# Patient Record
Sex: Male | Born: 1955 | Race: White | Hispanic: No | Marital: Married | Smoking: Former smoker
Health system: Southern US, Community
[De-identification: ages and names within clinical notes are randomized; demographics above are authoritative.]

## PROBLEM LIST (undated history)

## (undated) DIAGNOSIS — M1990 Unspecified osteoarthritis, unspecified site: Secondary | ICD-10-CM

## (undated) DIAGNOSIS — T884XXA Failed or difficult intubation, initial encounter: Secondary | ICD-10-CM

## (undated) DIAGNOSIS — N200 Calculus of kidney: Secondary | ICD-10-CM

## (undated) DIAGNOSIS — Z9989 Dependence on other enabling machines and devices: Secondary | ICD-10-CM

## (undated) DIAGNOSIS — B019 Varicella without complication: Secondary | ICD-10-CM

## (undated) DIAGNOSIS — G4733 Obstructive sleep apnea (adult) (pediatric): Secondary | ICD-10-CM

## (undated) DIAGNOSIS — G4730 Sleep apnea, unspecified: Secondary | ICD-10-CM

## (undated) DIAGNOSIS — E7800 Pure hypercholesterolemia, unspecified: Secondary | ICD-10-CM

## (undated) DIAGNOSIS — I10 Essential (primary) hypertension: Secondary | ICD-10-CM

## (undated) DIAGNOSIS — R3911 Hesitancy of micturition: Secondary | ICD-10-CM

## (undated) DIAGNOSIS — H8110 Benign paroxysmal vertigo, unspecified ear: Secondary | ICD-10-CM

## (undated) DIAGNOSIS — J339 Nasal polyp, unspecified: Secondary | ICD-10-CM

## (undated) DIAGNOSIS — Z87442 Personal history of urinary calculi: Secondary | ICD-10-CM

## (undated) DIAGNOSIS — K5900 Constipation, unspecified: Secondary | ICD-10-CM

## (undated) DIAGNOSIS — M2550 Pain in unspecified joint: Secondary | ICD-10-CM

## (undated) DIAGNOSIS — E119 Type 2 diabetes mellitus without complications: Secondary | ICD-10-CM

## (undated) DIAGNOSIS — T7840XA Allergy, unspecified, initial encounter: Secondary | ICD-10-CM

## (undated) DIAGNOSIS — L209 Atopic dermatitis, unspecified: Secondary | ICD-10-CM

## (undated) DIAGNOSIS — E559 Vitamin D deficiency, unspecified: Secondary | ICD-10-CM

## (undated) DIAGNOSIS — E8881 Metabolic syndrome: Secondary | ICD-10-CM

## (undated) DIAGNOSIS — F341 Dysthymic disorder: Secondary | ICD-10-CM

## (undated) HISTORY — DX: Nasal polyp, unspecified: J33.9

## (undated) HISTORY — PX: OTHER SURGICAL HISTORY: SHX169

## (undated) HISTORY — PX: UMBILICAL HERNIA REPAIR: SHX196

## (undated) HISTORY — DX: Obstructive sleep apnea (adult) (pediatric): G47.33

## (undated) HISTORY — DX: Atopic dermatitis, unspecified: L20.9

## (undated) HISTORY — DX: Varicella without complication: B01.9

## (undated) HISTORY — DX: Allergy, unspecified, initial encounter: T78.40XA

## (undated) HISTORY — DX: Failed or difficult intubation, initial encounter: T88.4XXA

## (undated) HISTORY — DX: Benign paroxysmal vertigo, unspecified ear: H81.10

## (undated) HISTORY — DX: Hesitancy of micturition: R39.11

## (undated) HISTORY — DX: Essential (primary) hypertension: I10

## (undated) HISTORY — DX: Pure hypercholesterolemia, unspecified: E78.00

## (undated) HISTORY — DX: Constipation, unspecified: K59.00

## (undated) HISTORY — DX: Sleep apnea, unspecified: G47.30

## (undated) HISTORY — DX: Calculus of kidney: N20.0

## (undated) HISTORY — DX: Vitamin D deficiency, unspecified: E55.9

## (undated) HISTORY — DX: Pain in unspecified joint: M25.50

## (undated) HISTORY — DX: Metabolic syndrome: E88.81

## (undated) HISTORY — DX: Dysthymic disorder: F34.1

## (undated) HISTORY — PX: LASIK: SHX215

## (undated) HISTORY — DX: Unspecified osteoarthritis, unspecified site: M19.90

## (undated) HISTORY — PX: TONSILLECTOMY: SUR1361

## (undated) HISTORY — DX: Dependence on other enabling machines and devices: Z99.89

---

## 2008-03-15 HISTORY — PX: COLONOSCOPY: SHX174

## 2016-11-10 LAB — CBC AND DIFFERENTIAL
Hemoglobin: 15 (ref 13.5–17.5)
Platelets: 216 (ref 150–399)
WBC: 6.6

## 2016-11-10 LAB — PSA: PSA: 1.45

## 2016-11-10 LAB — LIPID PANEL
Cholesterol: 156 (ref 0–200)
HDL: 37 (ref 35–70)
LDL Cholesterol: 80
Triglycerides: 194 — AB (ref 40–160)

## 2016-11-10 LAB — BASIC METABOLIC PANEL
BUN: 16 (ref 4–21)
Glucose: 93

## 2016-11-10 LAB — TSH: TSH: 1.82 (ref 0.41–5.90)

## 2017-04-01 ENCOUNTER — Ambulatory Visit (INDEPENDENT_AMBULATORY_CARE_PROVIDER_SITE_OTHER): Payer: BLUE CROSS/BLUE SHIELD | Admitting: Family Medicine

## 2017-04-01 ENCOUNTER — Encounter: Payer: Self-pay | Admitting: Family Medicine

## 2017-04-01 VITALS — BP 140/82 | HR 68 | Temp 98.6°F | Wt 280.4 lb

## 2017-04-01 DIAGNOSIS — F341 Dysthymic disorder: Secondary | ICD-10-CM

## 2017-04-01 DIAGNOSIS — I10 Essential (primary) hypertension: Secondary | ICD-10-CM | POA: Diagnosis not present

## 2017-04-01 DIAGNOSIS — N200 Calculus of kidney: Secondary | ICD-10-CM

## 2017-04-01 DIAGNOSIS — E8881 Metabolic syndrome: Secondary | ICD-10-CM

## 2017-04-01 DIAGNOSIS — E782 Mixed hyperlipidemia: Secondary | ICD-10-CM

## 2017-04-01 DIAGNOSIS — Z79899 Other long term (current) drug therapy: Secondary | ICD-10-CM | POA: Diagnosis not present

## 2017-04-01 DIAGNOSIS — E88819 Insulin resistance, unspecified: Secondary | ICD-10-CM

## 2017-04-01 DIAGNOSIS — R3911 Hesitancy of micturition: Secondary | ICD-10-CM

## 2017-04-01 DIAGNOSIS — E559 Vitamin D deficiency, unspecified: Secondary | ICD-10-CM

## 2017-04-01 DIAGNOSIS — M2550 Pain in unspecified joint: Secondary | ICD-10-CM | POA: Diagnosis not present

## 2017-04-01 DIAGNOSIS — L209 Atopic dermatitis, unspecified: Secondary | ICD-10-CM

## 2017-04-01 MED ORDER — SIMVASTATIN 40 MG PO TABS: 40.0000 mg | ORAL_TABLET | Freq: Every day | ORAL | 2 refills | Status: DC

## 2017-04-01 MED ORDER — METFORMIN HCL ER 500 MG PO TB24: 500.0000 mg | ORAL_TABLET | Freq: Two times a day (BID) | ORAL | 2 refills | Status: DC

## 2017-04-01 MED ORDER — ALLOPURINOL 300 MG PO TABS: 300.0000 mg | ORAL_TABLET | Freq: Every day | ORAL | 2 refills | Status: DC

## 2017-04-01 MED ORDER — DICLOFENAC SODIUM 75 MG PO TBEC: 75.0000 mg | DELAYED_RELEASE_TABLET | Freq: Two times a day (BID) | ORAL | 1 refills | Status: DC

## 2017-04-01 MED ORDER — TRIAMTERENE-HCTZ 37.5-25 MG PO CAPS: 1.0000 | ORAL_CAPSULE | Freq: Every day | ORAL | 2 refills | Status: DC

## 2017-04-01 MED ORDER — HYDROCORTISONE 2.5 % EX CREA: TOPICAL_CREAM | Freq: Two times a day (BID) | CUTANEOUS | 1 refills | Status: DC

## 2017-04-01 MED ORDER — CARVEDILOL 12.5 MG PO TABS: 12.5000 mg | ORAL_TABLET | Freq: Two times a day (BID) | ORAL | 2 refills | Status: DC

## 2017-04-03 ENCOUNTER — Encounter: Payer: Self-pay | Admitting: Family Medicine

## 2017-04-03 DIAGNOSIS — M2550 Pain in unspecified joint: Secondary | ICD-10-CM | POA: Insufficient documentation

## 2017-04-03 DIAGNOSIS — E88819 Insulin resistance, unspecified: Secondary | ICD-10-CM

## 2017-04-03 DIAGNOSIS — I10 Essential (primary) hypertension: Secondary | ICD-10-CM | POA: Insufficient documentation

## 2017-04-03 DIAGNOSIS — L209 Atopic dermatitis, unspecified: Secondary | ICD-10-CM

## 2017-04-03 DIAGNOSIS — R3911 Hesitancy of micturition: Secondary | ICD-10-CM | POA: Insufficient documentation

## 2017-04-03 DIAGNOSIS — N200 Calculus of kidney: Secondary | ICD-10-CM | POA: Insufficient documentation

## 2017-04-03 DIAGNOSIS — E8881 Metabolic syndrome: Secondary | ICD-10-CM | POA: Insufficient documentation

## 2017-04-03 DIAGNOSIS — E669 Obesity, unspecified: Secondary | ICD-10-CM | POA: Insufficient documentation

## 2017-04-03 DIAGNOSIS — E559 Vitamin D deficiency, unspecified: Secondary | ICD-10-CM

## 2017-04-03 DIAGNOSIS — F341 Dysthymic disorder: Secondary | ICD-10-CM

## 2017-04-03 DIAGNOSIS — E782 Mixed hyperlipidemia: Secondary | ICD-10-CM | POA: Insufficient documentation

## 2017-04-03 HISTORY — DX: Dysthymic disorder: F34.1

## 2017-04-03 HISTORY — DX: Hesitancy of micturition: R39.11

## 2017-04-03 HISTORY — DX: Vitamin D deficiency, unspecified: E55.9

## 2017-04-03 HISTORY — DX: Metabolic syndrome: E88.81

## 2017-04-03 HISTORY — DX: Pain in unspecified joint: M25.50

## 2017-04-03 HISTORY — DX: Insulin resistance, unspecified: E88.819

## 2017-04-03 HISTORY — DX: Atopic dermatitis, unspecified: L20.9

## 2017-04-03 NOTE — Progress Notes (Signed)
Donald Gilbert is a 62 y.o. male is here to [facility].   Patient Care Team: Helane Rima, DO as PCP - General (Family Medicine)   History of Present Illness:   HPI: See Assessment and Plan section for Problem Based Charting of issues discussed today.  Health Maintenance Due  Topic Date Due  . HEMOGLOBIN A1C  13-May-1955  . Hepatitis C Screening  06[birth year]57  . PNEUMOCOCCAL POLYSACCHARIDE VACCINE (1) 02/29/1960  . FOOT EXAM  02/29/1968  . OPHTHALMOLOGY EXAM  02/29/1968  . HIV Screening  02/28/1973  . COLONOSCOPY  02/29/2008   Depression screen PHQ 2/9 04/01/2017  Decreased Interest 2  Down, Depressed, Hopeless 0  PHQ - 2 Score 2  Altered sleeping 0  Tired, decreased energy 0  Change in appetite 0  Feeling bad or failure about yourself  3  Trouble concentrating 0  Moving slowly or fidgety/restless 0  Suicidal thoughts 0  PHQ-9 Score 5   PMHx, SurgHx, SocialHx, Medications, and Allergies were reviewed in the Visit Navigator and updated as appropriate.   Past Medical History:  Diagnosis Date  . Arthralgia of multiple joints 04/03/2017  . Arthritis   . Atopic dermatitis 04/03/2017  . Chickenpox   . Dysthymia 04/03/2017  . Hyperlipidemia   . Hypertension   . Insulin resistance 04/03/2017  . Kidney stones   . Recurrent kidney stones 04/03/2017  . Urinary hesitancy 04/03/2017  . Vitamin D deficiency 04/03/2017   History reviewed. No pertinent surgical history. History reviewed. No pertinent family history. Social History   Tobacco Use  . Smoking status: Former Smoker    Last attempt to quit: 11/1989    Years since quitting: 27.4  . Smokeless tobacco: Former Neurosurgeon    Types: Chew    Quit date: 2001  Substance Use Topics  . Alcohol use: Not on file  . Drug use: Not on file   Current Medications and Allergies:   .  allopurinol (ZYLOPRIM) 300 MG tablet, Take 1 tablet (300 mg total) by mouth daily., Disp: 30 tablet, Rfl: 2 .  carvedilol (COREG) 12.5 MG tablet,  Take 1 tablet (12.5 mg total) by mouth 2 (two) times daily., Disp: 180 tablet, Rfl: 2 .  diclofenac (VOLTAREN) 75 MG EC tablet, Take 1 tablet (75 mg total) by mouth 2 (two) times daily., Disp: 180 tablet, Rfl: 1 .  hydrocortisone 2.5 % cream, Apply topically 2 (two) times daily., Disp: 30 g, Rfl: 1 .  metFORMIN (GLUCOPHAGE-XR) 500 MG 24 hr tablet, Take 1 tablet (500 mg total) by mouth 2 (two) times daily., Disp: 180 tablet, Rfl: 2 .  simvastatin (ZOCOR) 40 MG tablet, Take 1 tablet (40 mg total) by mouth daily., Disp: 90 tablet, Rfl: 2 .  triamterene-hydrochlorothiazide (DYAZIDE) 37.5-25 MG capsule, Take 1 each (1 capsule total) by mouth daily., Disp: 90 capsule, Rfl: 2  No Known Allergies   Review of Systems:   Pertinent items are noted in the HPI. Otherwise, ROS is negative.  Vitals:   Vitals:   04/01/17 1408  BP: 140/82  Pulse: 68  Temp: 98.6 F (37 C)  TempSrc: Oral  SpO2: 98%  Weight: 280 lb 6.4 oz (127.2 kg)     There is no height or weight on file to calculate BMI.  Physical Exam:   Physical Exam  Constitutional: He is oriented to person, place, and time. He appears well-developed and well-nourished. No distress.  HENT:  Head: Normocephalic and atraumatic.  Right Ear: External ear normal.  Left  Ear: External ear normal.  Nose: Nose normal.  Mouth/Throat: Oropharynx is clear and moist.  Eyes: Conjunctivae and EOM are normal. Pupils are equal, round, and reactive to light.  Neck: Normal range of motion. Neck supple.  Cardiovascular: Normal rate, regular rhythm, normal heart sounds and intact distal pulses.  Pulmonary/Chest: Effort normal and breath sounds normal.  Abdominal: Soft. Bowel sounds are normal.  Musculoskeletal: Normal range of motion.  Neurological: He is alert and oriented to person, place, and time.  Skin: Skin is warm and dry.  Psychiatric: He has a normal mood and affect. His behavior is normal. Judgment and thought content normal.  Nursing note and  vitals reviewed.  No results found for this or any previous visit.  Assessment and Plan:   1. Essential hypertension - carvedilol (COREG) 12.5 MG tablet; Take 1 tablet (12.5 mg total) by mouth 2 (two) times daily.  Dispense: 180 tablet; Refill: 2 - triamterene-hydrochlorothiazide (DYAZIDE) 37.5-25 MG capsule; Take 1 each (1 capsule total) by mouth daily.  Dispense: 90 capsule; Refill: 2 - CBC with Differential/Platelet; Future - Comprehensive metabolic panel; Future  2. Morbid obesity (HCC) - TSH; Future - Vitamin B12; Future  3. Vitamin D deficiency - VITAMIN D 25 Hydroxy (Vit-D Deficiency, Fractures); Future  4. Mixed hyperlipidemia - simvastatin (ZOCOR) 40 MG tablet; Take 1 tablet (40 mg total) by mouth daily.  Dispense: 90 tablet; Refill: 2 - Lipid panel; Future  5. Recurrent kidney stones - allopurinol (ZYLOPRIM) 300 MG tablet; Take 1 tablet (300 mg total) by mouth daily.  Dispense: 30 tablet; Refill: 2  6. Insulin resistance - metFORMIN (GLUCOPHAGE-XR) 500 MG 24 hr tablet; Take 1 tablet (500 mg total) by mouth 2 (two) times daily.  Dispense: 180 tablet; Refill: 2 - Hemoglobin A1c; Future  7. Atopic dermatitis, unspecified type - hydrocortisone 2.5 % cream; Apply topically 2 (two) times daily.  Dispense: 30 g; Refill: 1  8. Arthralgia of multiple joints - diclofenac (VOLTAREN) 75 MG EC tablet; Take 1 tablet (75 mg total) by mouth 2 (two) times daily.  Dispense: 180 tablet; Refill: 1  9. Medication management Patient on Metformin.  - Vitamin B12; Future  10. Dysthymia PHQ9 as above.  11. Urinary hesitancy - PSA; Future  . Reviewed expectations re: course of current medical issues. . Discussed self-management of symptoms. . Outlined signs and symptoms indicating need for more acute intervention. . Patient verbalized understanding and all questions were answered. Marland Kitchen. Health Maintenance issues including appropriate healthy diet, exercise, and smoking avoidance were  discussed with patient. . See orders for this visit as documented in the electronic medical record. . Patient received an After Visit Summary.  Helane RimaErica Mannat Benedetti, DO [place] 04/03/2017  New patient. Records requested. Time spent with the patient: 20 minutes, of which >50% was spent in obtaining information about his symptoms, reviewing his previous labs, evaluations, and treatments, counseling him about his condition (please see the discussed topics above), and developing a plan to further investigate it; he had a number of questions which I addressed.

## 2017-04-04 ENCOUNTER — Other Ambulatory Visit (INDEPENDENT_AMBULATORY_CARE_PROVIDER_SITE_OTHER): Payer: BLUE CROSS/BLUE SHIELD

## 2017-04-04 DIAGNOSIS — E782 Mixed hyperlipidemia: Secondary | ICD-10-CM

## 2017-04-04 DIAGNOSIS — Z79899 Other long term (current) drug therapy: Secondary | ICD-10-CM

## 2017-04-04 DIAGNOSIS — I10 Essential (primary) hypertension: Secondary | ICD-10-CM

## 2017-04-04 DIAGNOSIS — E559 Vitamin D deficiency, unspecified: Secondary | ICD-10-CM | POA: Diagnosis not present

## 2017-04-04 DIAGNOSIS — R3911 Hesitancy of micturition: Secondary | ICD-10-CM

## 2017-04-04 DIAGNOSIS — E8881 Metabolic syndrome: Secondary | ICD-10-CM | POA: Diagnosis not present

## 2017-04-05 LAB — LIPID PANEL
Cholesterol: 140 mg/dL (ref 0–200)
HDL: 31.8 mg/dL — ABNORMAL LOW (ref >39.00)
LDL Cholesterol: 77 mg/dL (ref 0–99)
NonHDL: 107.84
Total CHOL/HDL Ratio: 4
Triglycerides: 154 mg/dL — ABNORMAL HIGH (ref 0.0–149.0)
VLDL: 30.8 mg/dL (ref 0.0–40.0)

## 2017-04-05 LAB — CBC WITH DIFFERENTIAL/PLATELET
Basophils Absolute: 0.1 10*3/uL (ref 0.0–0.1)
Basophils Relative: 0.9 % (ref 0.0–3.0)
Eosinophils Absolute: 0.4 10*3/uL (ref 0.0–0.7)
Eosinophils Relative: 6.4 % — ABNORMAL HIGH (ref 0.0–5.0)
HCT: 43.7 % (ref 39.0–52.0)
Hemoglobin: 15 g/dL (ref 13.0–17.0)
Lymphocytes Relative: 21.7 % (ref 12.0–46.0)
Lymphs Abs: 1.5 10*3/uL (ref 0.7–4.0)
MCHC: 34.5 g/dL (ref 30.0–36.0)
MCV: 86.6 fl (ref 78.0–100.0)
Monocytes Absolute: 0.6 10*3/uL (ref 0.1–1.0)
Monocytes Relative: 9.4 % (ref 3.0–12.0)
Neutro Abs: 4.2 10*3/uL (ref 1.4–7.7)
Neutrophils Relative %: 61.6 % (ref 43.0–77.0)
Platelets: 149 10*3/uL — ABNORMAL LOW (ref 150.0–400.0)
RBC: 5.04 Mil/uL (ref 4.22–5.81)
RDW: 14.5 % (ref 11.5–15.5)
WBC: 6.8 10*3/uL (ref 4.0–10.5)

## 2017-04-05 LAB — VITAMIN B12: Vitamin B-12: 451 pg/mL (ref 211–911)

## 2017-04-05 LAB — COMPREHENSIVE METABOLIC PANEL
ALT: 31 U/L (ref 0–53)
AST: 20 U/L (ref 0–37)
Albumin: 4.2 g/dL (ref 3.5–5.2)
Alkaline Phosphatase: 51 U/L (ref 39–117)
BUN: 17 mg/dL (ref 6–23)
CO2: 29 mEq/L (ref 19–32)
Calcium: 9 mg/dL (ref 8.4–10.5)
Chloride: 101 mEq/L (ref 96–112)
Creatinine, Ser: 0.85 mg/dL (ref 0.40–1.50)
GFR: 98.02 mL/min (ref >60.00)
Glucose, Bld: 88 mg/dL (ref 70–99)
Potassium: 3.4 mEq/L — ABNORMAL LOW (ref 3.5–5.1)
Sodium: 138 mEq/L (ref 135–145)
Total Bilirubin: 0.9 mg/dL (ref 0.2–1.2)
Total Protein: 5.9 g/dL — ABNORMAL LOW (ref 6.0–8.3)

## 2017-04-05 LAB — PSA: PSA: 1.36 ng/mL (ref 0.10–4.00)

## 2017-04-05 LAB — HEMOGLOBIN A1C: Hgb A1c MFr Bld: 5.9 % (ref 4.6–6.5)

## 2017-04-05 LAB — VITAMIN D 25 HYDROXY (VIT D DEFICIENCY, FRACTURES): VITD: 13.71 ng/mL — ABNORMAL LOW (ref 30.00–100.00)

## 2017-04-05 LAB — TSH: TSH: 2.11 u[IU]/mL (ref 0.35–4.50)

## 2017-04-05 MED ORDER — CHOLECALCIFEROL 1.25 MG (50000 UT) PO TABS: ORAL_TABLET | ORAL | 0 refills | Status: DC

## 2017-04-05 NOTE — Addendum Note (Signed)
Addended by: Helane RimaWALLACE, Dulcemaria Bula R on: 04/05/2017 08:27 PM   Modules accepted: Orders

## 2017-04-08 DIAGNOSIS — G4733 Obstructive sleep apnea (adult) (pediatric): Secondary | ICD-10-CM | POA: Diagnosis not present

## 2017-06-26 ENCOUNTER — Other Ambulatory Visit: Payer: Self-pay | Admitting: Family Medicine

## 2017-06-26 DIAGNOSIS — N200 Calculus of kidney: Secondary | ICD-10-CM

## 2017-06-27 ENCOUNTER — Ambulatory Visit (INDEPENDENT_AMBULATORY_CARE_PROVIDER_SITE_OTHER): Payer: BLUE CROSS/BLUE SHIELD | Admitting: Family Medicine

## 2017-06-27 ENCOUNTER — Encounter: Payer: Self-pay | Admitting: Family Medicine

## 2017-06-27 ENCOUNTER — Telehealth: Payer: Self-pay | Admitting: *Deleted

## 2017-06-27 VITALS — BP 138/80 | HR 87 | Temp 98.7°F | Ht 70.0 in | Wt 270.4 lb

## 2017-06-27 DIAGNOSIS — Z1159 Encounter for screening for other viral diseases: Secondary | ICD-10-CM | POA: Diagnosis not present

## 2017-06-27 DIAGNOSIS — R197 Diarrhea, unspecified: Secondary | ICD-10-CM

## 2017-06-27 DIAGNOSIS — E782 Mixed hyperlipidemia: Secondary | ICD-10-CM

## 2017-06-27 DIAGNOSIS — Z1211 Encounter for screening for malignant neoplasm of colon: Secondary | ICD-10-CM

## 2017-06-27 DIAGNOSIS — Z114 Encounter for screening for human immunodeficiency virus [HIV]: Secondary | ICD-10-CM

## 2017-06-27 DIAGNOSIS — I10 Essential (primary) hypertension: Secondary | ICD-10-CM

## 2017-06-27 DIAGNOSIS — Z9189 Other specified personal risk factors, not elsewhere classified: Secondary | ICD-10-CM | POA: Diagnosis not present

## 2017-06-27 LAB — MICROALBUMIN / CREATININE URINE RATIO
Creatinine,U: 183.4 mg/dL
Microalb Creat Ratio: 0.5 mg/g (ref 0.0–30.0)
Microalb, Ur: 0.9 mg/dL (ref 0.0–1.9)

## 2017-06-27 MED ORDER — LOSARTAN POTASSIUM-HCTZ 50-12.5 MG PO TABS: 1.0000 | ORAL_TABLET | Freq: Every day | ORAL | 3 refills | Status: DC

## 2017-06-27 NOTE — Telephone Encounter (Signed)
Called and spoke to patient to gather additional information for appt. He states that he started experiencing diarrhea and vomiting on Thursday. He had to leave work early and was unable to go in on Friday. He states it got better Saturday and Sunday and then his diarrhea started again Sunday night. He also states he has been burping and it has smelled acidic.

## 2017-06-27 NOTE — Patient Instructions (Signed)
MANAGEMENT OF CHRONIC CONSTIPATION   Push fluids, all day, everyday  Eat lots of high fiber foods-fruits, veggies, bran and whole grain instead of white bread  Be active everyday. Inactivity makes constipation worse.  Add psyllium daily (Metamucil) which comes in capsules now. Start very low dose and work up to recommended dose on bottle daily.  Stay away from Milk of Magnesia or any magnesium containing laxative, unless you need it to clear things out rarely. It is an addictive laxative and your gut will become dependent on it.  If that is not working, I would start Miralax, which you can buy in generic 17 gms daily. It's a powder and not an "addictive laxative". Take it every day and titrate the dose up or down to get the daily BM.  Try taking Magnesium 400 mg at night as well.

## 2017-06-27 NOTE — Progress Notes (Signed)
Donald Gilbert is a 61 y.o. male is here for AN ACUTE VISIT.   History of Present Illness:   Donald Gilbert, CMA acting as scribe for Dr. Helane Rima.   HPI: Patient started having diarrhea last Thursday. Got a little better on Saturday. Last night started back up. He has over five bowel movements that started out just loose but the last few have been like water. He has some liquids today no food. No fever or abdominal pain. Stools have changed colors from brown to a green color.   Health Maintenance Due  Topic Date Due  . Hepatitis C Screening  [birth year]  . URINE MICROALBUMIN  02/29/1968  . HIV Screening  02/28/1973  . COLONOSCOPY  02/29/2008   Depression screen PHQ 2/9 04/01/2017  Decreased Interest 2  Down, Depressed, Hopeless 0  PHQ - 2 Score 2  Altered sleeping 0  Tired, decreased energy 0  Change in appetite 0  Feeling bad or failure about yourself  3  Trouble concentrating 0  Moving slowly or fidgety/restless 0  Suicidal thoughts 0  PHQ-9 Score 5   PMHx, SurgHx, SocialHx, FamHx, Medications, and Allergies were reviewed in the Visit Navigator and updated as appropriate.   Patient Active Problem List   Diagnosis Date Noted  . Vitamin D deficiency 04/03/2017  . Morbid obesity (HCC) 04/03/2017  . Essential hypertension 04/03/2017  . Mixed hyperlipidemia 04/03/2017  . Recurrent kidney stones 04/03/2017  . Insulin resistance 04/03/2017  . Arthralgia of multiple joints 04/03/2017  . Atopic dermatitis 04/03/2017  . Urinary hesitancy 04/03/2017  . Dysthymia 04/03/2017   Social History   Tobacco Use  . Smoking status: Former Smoker    Last attempt to quit: 11/1989    Years since quitting: 27.6  . Smokeless tobacco: Former Neurosurgeon    Types: Chew    Quit date: 2001  Substance Use Topics  . Alcohol use: Yes    Comment: OCC   . Drug use: No   Current Medications and Allergies:   .  allopurinol (ZYLOPRIM) 300 MG tablet, TAKE 1 TABLET(300 MG) BY MOUTH DAILY,  Disp: 30 tablet, Rfl: 0 .  aspirin EC 81 MG tablet, Take 1 tablet (81 mg total) by mouth daily., Disp: 90 tablet, Rfl: 0 .  carvedilol (COREG) 12.5 MG tablet, Take 1 tablet (12.5 mg total) by mouth 2 (two) times daily., Disp: 180 tablet, Rfl: 2 .  Cholecalciferol 50000 units TABS, 50,000 units PO qwk for 12 weeks., Disp: 12 tablet, Rfl: 0 .  diclofenac (VOLTAREN) 75 MG EC tablet, Take 1 tablet (75 mg total) by mouth 2 (two) times daily., Disp: 180 tablet, Rfl: 1 .  hydrocortisone 2.5 % cream, Apply topically 2 (two) times daily., Disp: 30 g, Rfl: 1 .  metFORMIN (GLUCOPHAGE-XR) 500 MG 24 hr tablet, Take 1 tablet (500 mg total) by mouth 2 (two) times daily., Disp: 180 tablet, Rfl: 2 .  simvastatin (ZOCOR) 40 MG tablet, Take 1 tablet (40 mg total) by mouth daily., Disp: 90 tablet, Rfl: 2 .  losartan-hydrochlorothiazide (HYZAAR) 50-12.5 MG tablet, Take 1 tablet by mouth daily., Disp: 90 tablet, Rfl: 3  No Known Allergies   Review of Systems   Pertinent items are noted in the HPI. Otherwise, ROS is negative.  Vitals:   Vitals:   06/27/17 1243  BP: 138/80  Pulse: 87  Temp: 98.7 F (37.1 C)  TempSrc: Oral  SpO2: 97%  Weight: 270 lb 6.4 oz (122.7 kg)  Height: 5\' 10"  (1.778  m)     Body mass index is 38.8 kg/m.   Physical Exam:   Physical Exam  Constitutional: He is oriented to person, place, and time. He appears well-developed and well-nourished. No distress.  HENT:  Head: Normocephalic and atraumatic.  Right Ear: External ear normal.  Left Ear: External ear normal.  Nose: Nose normal.  Mouth/Throat: Oropharynx is clear and moist.  Eyes: Pupils are equal, round, and reactive to light. Conjunctivae and EOM are normal.  Neck: Normal range of motion. Neck supple.  Cardiovascular: Normal rate, regular rhythm, normal heart sounds and intact distal pulses.  Pulmonary/Chest: Effort normal and breath sounds normal.  Abdominal: Soft. Bowel sounds are normal. He exhibits distension.    Musculoskeletal: Normal range of motion.  Neurological: He is alert and oriented to person, place, and time.  Skin: Skin is warm and dry.  Psychiatric: He has a normal mood and affect. His behavior is normal. Judgment and thought content normal.  Nursing note and vitals reviewed.   Assessment and Plan:   Donald Gilbert was seen today for diarrhea.  Diagnoses and all orders for this visit:  Diarrhea of presumed infectious origin Comments: Recent GI symptoms likely from gastroenteritis.  Reviewed red flags.  Screening for colon cancer -     Ambulatory referral to Gastroenterology  Essential hypertension Comments: We will alter medication regimen today to optimize blood pressure.  ASCVD will lower to 6.9%. Orders: -     Microalbumin / creatinine urine ratio -     losartan-hydrochlorothiazide (HYZAAR) 50-12.5 MG tablet; Take 1 tablet by mouth daily.  Mixed hyperlipidemia Comments: No medication change today but recommended exercise and fiber to elevate HDL.   Screening for HIV (human immunodeficiency virus) -     HIV antibody  Encounter for hepatitis C virus screening test for high risk patient -     Hepatitis C antibody   . Reviewed expectations re: course of current medical issues. . Discussed self-management of symptoms. . Outlined signs and symptoms indicating need for more acute intervention. . Patient verbalized understanding and all questions were answered. Marland Kitchen. Health Maintenance issues including appropriate healthy diet, exercise, and smoking avoidance were discussed with patient. . See orders for this visit as documented in the electronic medical record. . Patient received an After Visit Summary.  Helane RimaErica Marlys Stegmaier, DO [place] 06/27/2017  Future Appointments  Date Time Provider Department Center  07/08/2017  3:00 PM Helane RimaWallace, Damani Rando, DO [facility]

## 2017-06-28 LAB — HEPATITIS C ANTIBODY
Hepatitis C Ab: NONREACTIVE
SIGNAL TO CUT-OFF: 0.01 (ref <1.00)

## 2017-06-28 LAB — HIV ANTIBODY (ROUTINE TESTING W REFLEX): HIV 1&2 Ab, 4th Generation: NONREACTIVE

## 2017-07-08 ENCOUNTER — Ambulatory Visit: Payer: BLUE CROSS/BLUE SHIELD | Admitting: Family Medicine

## 2017-07-08 DIAGNOSIS — G4733 Obstructive sleep apnea (adult) (pediatric): Secondary | ICD-10-CM | POA: Diagnosis not present

## 2017-07-11 DIAGNOSIS — G4733 Obstructive sleep apnea (adult) (pediatric): Secondary | ICD-10-CM | POA: Diagnosis not present

## 2017-07-20 ENCOUNTER — Other Ambulatory Visit: Payer: Self-pay | Admitting: Family Medicine

## 2017-07-24 ENCOUNTER — Other Ambulatory Visit: Payer: Self-pay | Admitting: Family Medicine

## 2017-07-24 DIAGNOSIS — N200 Calculus of kidney: Secondary | ICD-10-CM

## 2017-08-02 ENCOUNTER — Encounter: Payer: Self-pay | Admitting: Family Medicine

## 2017-08-02 ENCOUNTER — Ambulatory Visit (INDEPENDENT_AMBULATORY_CARE_PROVIDER_SITE_OTHER): Payer: BLUE CROSS/BLUE SHIELD | Admitting: Family Medicine

## 2017-08-02 VITALS — BP 154/76 | HR 58 | Temp 98.7°F | Ht 70.0 in | Wt 280.0 lb

## 2017-08-02 DIAGNOSIS — G4730 Sleep apnea, unspecified: Secondary | ICD-10-CM

## 2017-08-02 DIAGNOSIS — E782 Mixed hyperlipidemia: Secondary | ICD-10-CM

## 2017-08-02 DIAGNOSIS — I10 Essential (primary) hypertension: Secondary | ICD-10-CM | POA: Diagnosis not present

## 2017-08-02 DIAGNOSIS — Z23 Encounter for immunization: Secondary | ICD-10-CM | POA: Diagnosis not present

## 2017-08-02 MED ORDER — ASPIRIN EC 81 MG PO TBEC: 81.0000 mg | DELAYED_RELEASE_TABLET | Freq: Two times a day (BID) | ORAL | 6 refills | Status: DC

## 2017-08-02 MED ORDER — BUPROPION HCL ER (XL) 150 MG PO TB24: 150.0000 mg | ORAL_TABLET | Freq: Every day | ORAL | 3 refills | Status: DC

## 2017-08-02 MED ORDER — LOSARTAN POTASSIUM-HCTZ 100-25 MG PO TABS: 1.0000 | ORAL_TABLET | Freq: Every day | ORAL | 3 refills | Status: DC

## 2017-08-02 NOTE — Progress Notes (Signed)
Donald Gilbert is a 62 y.o. male is here for follow up.  History of Present Illness:   Donald Gilbert CMA acting as scribe for Dr. Juleen China.  HPI: Patient comes in today for his follow up for his hypertension. His systolic number has been elevated for the last month that he has been keeping record of his blood pressures.  Sleep Apnea: Patient has sleep apnea. He is needing new study done. Referral has been placed for neurology.   Hypertension: Patient has been taking his blood pressures at home. They have been running high. Discussed changing blood pressure medication due to being uncontrolled. Will increase  His hydrochlorothiazide to 25 mg. Patient is currently take the asprin 81 mg daily. Will increase to 81 mg morinng and night. Discussed doing a calcium score. Will place an order for CT.   Obesity: Discussed starting the patient on Wellbutrin for weight loss. Patient is not a candidate for stimulants. Patient would like to start the Wellbutrin.    Health Maintenance Due  Topic Date Due  . COLONOSCOPY  02/29/2008   Depression screen PHQ 2/9 04/01/2017  Decreased Interest 2  Down, Depressed, Hopeless 0  PHQ - 2 Score 2  Altered sleeping 0  Tired, decreased energy 0  Change in appetite 0  Feeling bad or failure about yourself  3  Trouble concentrating 0  Moving slowly or fidgety/restless 0  Suicidal thoughts 0  PHQ-9 Score 5   PMHx, SurgHx, SocialHx, FamHx, Medications, and Allergies were reviewed in the Visit Navigator and updated as appropriate.   Patient Active Problem List   Diagnosis Date Noted  . Vitamin D deficiency 04/03/2017  . Morbid obesity ([place]) 04/03/2017  . Essential hypertension 04/03/2017  . Mixed hyperlipidemia 04/03/2017  . Recurrent kidney stones 04/03/2017  . Insulin resistance 04/03/2017  . Arthralgia of multiple joints 04/03/2017  . Atopic dermatitis 04/03/2017  . Urinary hesitancy 04/03/2017  . Dysthymia 04/03/2017   Social History   Tobacco  Use  . Smoking status: Former Smoker    Last attempt to quit: 11/1989    Years since quitting: 27.7  . Smokeless tobacco: Former Systems developer    Types: Chew    Quit date: 2001  Substance Use Topics  . Alcohol use: Yes    Comment: OCC   . Drug use: No   Current Medications and Allergies:   Current Outpatient Medications:  .  allopurinol (ZYLOPRIM) 300 MG tablet, TAKE 1 TABLET(300 MG) BY MOUTH DAILY, Disp: 30 tablet, Rfl: 1 .  aspirin EC 81 MG tablet, Take 1 tablet (81 mg total) by mouth daily., Disp: 90 tablet, Rfl: 0 .  carvedilol (COREG) 12.5 MG tablet, Take 1 tablet (12.5 mg total) by mouth 2 (two) times daily., Disp: 180 tablet, Rfl: 2 .  Cholecalciferol 50000 units TABS, 50,000 units PO qwk for 12 weeks., Disp: 12 tablet, Rfl: 0 .  diclofenac (VOLTAREN) 75 MG EC tablet, Take 1 tablet (75 mg total) by mouth 2 (two) times daily., Disp: 180 tablet, Rfl: 1 .  hydrocortisone 2.5 % cream, Apply topically 2 (two) times daily., Disp: 30 g, Rfl: 1 .  losartan-hydrochlorothiazide (HYZAAR) 50-12.5 MG tablet, Take 1 tablet by mouth daily., Disp: 90 tablet, Rfl: 3 .  metFORMIN (GLUCOPHAGE-XR) 500 MG 24 hr tablet, Take 1 tablet (500 mg total) by mouth 2 (two) times daily., Disp: 180 tablet, Rfl: 2 .  simvastatin (ZOCOR) 40 MG tablet, Take 1 tablet (40 mg total) by mouth daily., Disp: 90 tablet, Rfl: 2  No Known Allergies Review of Systems   Pertinent items are noted in the HPI. Otherwise, ROS is negative.  Vitals:   Vitals:   08/02/17 1603  BP: (!) 154/76  Pulse: (!) 58  Temp: 98.7 F (37.1 C)  TempSrc: Oral  SpO2: 96%  Weight: 280 lb (127 kg)  Height: 5' 10" (1.778 m)     Body mass index is 40.18 kg/m.  Physical Exam:   Physical Exam  Constitutional: He is oriented to person, place, and time. He appears well-developed and well-nourished. No distress.  HENT:  Head: Normocephalic and atraumatic.  Right Ear: External ear normal.  Left Ear: External ear normal.  Nose: Nose normal.    Mouth/Throat: Oropharynx is clear and moist.  Eyes: Pupils are equal, round, and reactive to light. Conjunctivae and EOM are normal.  Neck: Normal range of motion. Neck supple.  Cardiovascular: Normal rate, regular rhythm, normal heart sounds and intact distal pulses.  Pulmonary/Chest: Effort normal and breath sounds normal.  Abdominal: Soft. Bowel sounds are normal.  Musculoskeletal: Normal range of motion.  Neurological: He is alert and oriented to person, place, and time.  Skin: Skin is warm and dry.  Psychiatric: He has a normal mood and affect. His behavior is normal. Judgment and thought content normal.  Nursing note and vitals reviewed.  Assessment and Plan:   Donald Gilbert was seen today for follow-up.  Diagnoses and all orders for this visit:  Sleep apnea, unspecified type Comments: New sleep study placed. Orders: -     Ambulatory referral to Neurology  Essential hypertension Comments: Not at goal. Will increase Hyzaar. Recheck in one month. Orders: -     aspirin EC 81 MG tablet; Take 1 tablet (81 mg total) by mouth 2 (two) times daily. -     losartan-hydrochlorothiazide (HYZAAR) 100-25 MG tablet; Take 1 tablet by mouth daily. -     CT CARDIAC SCORING; Future  Mixed hyperlipidemia -     aspirin EC 81 MG tablet; Take 1 tablet (81 mg total) by mouth 2 (two) times daily. -     losartan-hydrochlorothiazide (HYZAAR) 100-25 MG tablet; Take 1 tablet by mouth daily. -     CT CARDIAC SCORING; Future  Need for immunization against rubella and mumps -     MMR vaccine subcutaneous  Morbid obesity ([place]) -     buPROPion (WELLBUTRIN XL) 150 MG 24 hr tablet; Take 1 tablet (150 mg total) by mouth daily.    . Reviewed expectations re: course of current medical issues. . Discussed self-management of symptoms. . Outlined signs and symptoms indicating need for more acute intervention. . Patient verbalized understanding and all questions were answered. Marland Kitchen Health Maintenance issues  including appropriate healthy diet, exercise, and smoking avoidance were discussed with patient. . See orders for this visit as documented in the electronic medical record. . Patient received an After Visit Summary.  Briscoe Deutscher, DO [place] 08/06/2017  Future Appointments  Date Time Provider [place]  08/10/2017  4:15 PM [facility]  11/04/2017  3:00 PM Briscoe Deutscher, DO [facility]    CMA served as scribe during this visit. History, Physical, and Plan performed by medical provider. The above documentation has been reviewed and is accurate and complete. Briscoe Deutscher, D.O.

## 2017-08-10 ENCOUNTER — Ambulatory Visit
Admission: RE | Admit: 2017-08-10 | Discharge: 2017-08-10 | Disposition: A | Discharge status: Home / Self Care | Payer: Self-pay | Source: Ambulatory Visit | Attending: Family Medicine | Admitting: Family Medicine

## 2017-08-10 DIAGNOSIS — E782 Mixed hyperlipidemia: Secondary | ICD-10-CM

## 2017-08-10 DIAGNOSIS — I10 Essential (primary) hypertension: Secondary | ICD-10-CM

## 2017-08-15 ENCOUNTER — Other Ambulatory Visit: Payer: Self-pay | Admitting: Surgical

## 2017-08-15 DIAGNOSIS — G4730 Sleep apnea, unspecified: Secondary | ICD-10-CM

## 2017-08-16 DIAGNOSIS — G4733 Obstructive sleep apnea (adult) (pediatric): Secondary | ICD-10-CM | POA: Diagnosis not present

## 2017-09-01 ENCOUNTER — Telehealth: Payer: Self-pay | Admitting: Internal Medicine

## 2017-09-01 NOTE — Telephone Encounter (Signed)
°  Received colon/path report. Dr. Leone PayorGessner is Doc of the Day for 06/27/17 PM. Records placed on his desk for review.

## 2017-09-12 ENCOUNTER — Encounter: Payer: Self-pay | Admitting: Internal Medicine

## 2017-09-12 NOTE — Telephone Encounter (Signed)
Since Dr. Elenore PaddyGesner has reviewed records and determined next routine colonoscopy Jan 2020, please put patient on for recall on my list at that time.

## 2017-09-12 NOTE — Telephone Encounter (Signed)
Records placed on Dr. Irving Burtonanis's desk for review.

## 2017-09-12 NOTE — Telephone Encounter (Signed)
Records reviewed Colonoscopy 04/10/2008 Descending and rectal diminutive polyps were hyperplastic and diminutive  Had mild ileitis - erythematous ileal mucosa (was a screening exam)  Please tell the patient that next routine colonoscopy should be in about 03/2018 and we should place a recall for that  If he has chronic diarrhea issues (? If the inflammation in his ileum (small intestine) is ongoing then he should schedule an office visit and we can discuss  We should assign him to Dr. Myrtie Neitheranis for recall and OV if needed

## 2017-09-12 NOTE — Telephone Encounter (Signed)
Left message for patient to return my call and recall colon has been entered.

## 2017-09-16 DIAGNOSIS — G4733 Obstructive sleep apnea (adult) (pediatric): Secondary | ICD-10-CM | POA: Diagnosis not present

## 2017-09-19 ENCOUNTER — Other Ambulatory Visit: Payer: Self-pay | Admitting: Family Medicine

## 2017-09-19 DIAGNOSIS — M2550 Pain in unspecified joint: Secondary | ICD-10-CM

## 2017-09-20 ENCOUNTER — Other Ambulatory Visit: Payer: Self-pay | Admitting: Family Medicine

## 2017-09-20 DIAGNOSIS — N200 Calculus of kidney: Secondary | ICD-10-CM

## 2017-10-20 ENCOUNTER — Ambulatory Visit (INDEPENDENT_AMBULATORY_CARE_PROVIDER_SITE_OTHER): Payer: BLUE CROSS/BLUE SHIELD | Admitting: Neurology

## 2017-10-20 ENCOUNTER — Encounter: Payer: Self-pay | Admitting: Neurology

## 2017-10-20 VITALS — BP 142/88 | HR 56 | Ht 70.0 in | Wt 279.0 lb

## 2017-10-20 DIAGNOSIS — Z9989 Dependence on other enabling machines and devices: Secondary | ICD-10-CM

## 2017-10-20 DIAGNOSIS — G4733 Obstructive sleep apnea (adult) (pediatric): Secondary | ICD-10-CM

## 2017-10-20 NOTE — Progress Notes (Signed)
Subjective:    Patient ID: Donald Gilbert is a 62 y.o. male.  HPI     Donald Foley, MD, PhD [facility] [address] [address] [place], [place] [address]  Dear Dr. Earlene Plater,   I saw your patient, Donald Gilbert, upon your kind request in my neurologic clinic today for initial consultation of his sleep disorder in particular, reevaluation of his prior diagnosis of OSA. The patient is accompanied by his wife today. As you know, Donald Gilbert is a 62 year old right-handed gentleman with an underlying medical history of vitamin D deficiency, hypertension, hyperlipidemia, recurrent kidney stones, insulin resistance, b/l lower extremity edema, arthritis, eczema, remote history of smoking and morbid obesity with BMI of 40, who was previously diagnosed with obstructive sleep apnea and placed on CPAP therapy. Prior sleep study results are not available for my review today. We will request old sleep study records. He had a sleep specialist in [place] but has not seen him in over one year. CPAP supplies were coming from [place]. A CPAP download was reviewed today from 09/20/2017 through 10/19/2017 which is a total of 30 days, during which time he used his CPAP 29 days with percent used days greater than 4 hours at 76.7%, indicating adequate compliance with an average usage of 4 hours and 41 minutes, residual AHI at goal at 0.7 per hour, leak low, CPAP pressure of 10 cm. I reviewed your office note from 08/02/2017. Using a Mirage FX nasal mask with success. He typically does not sleep very long. He averages about 4-5 hours of sleep he reports. Bedtime is generally around 8:30 or 9, rise time around 2 AM. He does not have night to night nocturia or recurrent morning headaches. He does not report any family history of OSA. He had UPPP after his original sleep apnea diagnosis. His sleep study was about 6 or 7 years ago. He moved from [place] back to [place] in October 2018 to be closer  to his daughter and now 92-month-old granddaughter. He has another daughter in [place]. He quit smoking in 1991 and drinks alcohol occasionally, once or twice per week, caffeine none regular basis, came off of soda. He lives with his wife. They help take care of the grandbaby. He would like to get new supplies. 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 Weight has fluctuated a little bit but generally has remained fairly stable.  His Past Medical History Is Significant For: Past Medical History:  Diagnosis Date  . Arthralgia of multiple joints 04/03/2017  . Atopic dermatitis 04/03/2017  . BPPV (benign paroxysmal positional vertigo)   . Chickenpox   . Dysthymia 04/03/2017  . Hyperlipidemia   . Hypertension   . Insulin resistance 04/03/2017  . Kidney stone   . Nasal polyp   . OSA on CPAP   . Recurrent kidney stones 04/03/2017  . Urinary hesitancy 04/03/2017  . Vitamin D deficiency 04/03/2017    His Past Surgical History Is Significant For: Past Surgical History:  Procedure Laterality Date  . COLONOSCOPY  03/2008   no precancerous polyps  . TONSILLECTOMY    . UMBILICAL HERNIA REPAIR      His Family History Is Significant For: Family History  Problem Relation Age of Onset  . Hypertension Mother     His Social History Is Significant For: Social History   Socioeconomic History  . Marital status: Married    Spouse name: Not on file  . Number  of children: Not on file  . Years of education: Not on file  . Highest education level: Not on file  Occupational History    Employer: [employer]  Social Needs  . Financial resource strain: Not on file  . Food insecurity:    Worry: Not on file    Inability: Not on file  . Transportation needs:    Medical: Not on file    Non-medical: Not on file  Tobacco Use  . Smoking status: Former Smoker    Last attempt to quit: 11/1989    Years since quitting: 27.9  . Smokeless tobacco: Former NeurosurgeonUser     Types: Chew    Quit date: 2001  Substance and Sexual Activity  . Alcohol use: Yes    Comment: OCC   . Drug use: No  . Sexual activity: Yes  Lifestyle  . Physical activity:    Days per week: Not on file    Minutes per session: Not on file  . Stress: Not on file  Relationships  . Social connections:    Talks on phone: Not on file    Gets together: Not on file    Attends religious service: Not on file    Active member of club or organization: Not on file    Attends meetings of clubs or organizations: Not on file    Relationship status: Not on file  Other Topics Concern  . Not on file  Social History Narrative  . Not on file    His Allergies Are:  No Known Allergies:   His Current Medications Are:  Outpatient Encounter Medications as of 10/20/2017  Medication Sig  . allopurinol (ZYLOPRIM) 300 MG tablet TAKE 1 TABLET(300 MG) BY MOUTH DAILY  . aspirin EC 81 MG tablet Take 1 tablet (81 mg total) by mouth 2 (two) times daily.  Marland Kitchen. buPROPion (WELLBUTRIN XL) 150 MG 24 hr tablet Take 1 tablet (150 mg total) by mouth daily.  . carvedilol (COREG) 12.5 MG tablet Take 1 tablet (12.5 mg total) by mouth 2 (two) times daily.  . Cholecalciferol 50000 units TABS 50,000 units PO qwk for 12 weeks.  . diclofenac (VOLTAREN) 75 MG EC tablet TAKE 1 TABLET(75 MG) BY MOUTH TWICE DAILY  . hydrocortisone 2.5 % cream Apply topically 2 (two) times daily.  Marland Kitchen. losartan-hydrochlorothiazide (HYZAAR) 100-25 MG tablet Take 1 tablet by mouth daily.  . metFORMIN (GLUCOPHAGE-XR) 500 MG 24 hr tablet Take 1 tablet (500 mg total) by mouth 2 (two) times daily.  . simvastatin (ZOCOR) 40 MG tablet Take 1 tablet (40 mg total) by mouth daily.  . [DISCONTINUED] losartan-hydrochlorothiazide (HYZAAR) 50-12.5 MG tablet Take 1 tablet by mouth daily.  . [DISCONTINUED] aspirin EC 81 MG tablet Take 1 tablet (81 mg total) by mouth daily.  . [DISCONTINUED] losartan-hydrochlorothiazide (HYZAAR) 100-25 MG tablet Take 1 tablet by  mouth daily.   No facility-administered encounter medications on file as of 10/20/2017.   :  Review of Systems:  Out of a complete 14 point review of systems, all are reviewed and negative with the exception of these symptoms as listed below: Review of Systems  Neurological:       Patient reports that his doctor in [place] has been filling his supplies but he hasn't seen him in almost a year. They are here to establish care.   Epworth Sleepiness Scale 0= would never doze 1= slight chance of dozing 2= moderate chance of dozing 3= high chance of dozing  Sitting and reading:3  Watching TV:2 Sitting inactive in a public place (ex. Theater or meeting):1 As a passenger in a car for an hour without a break:1 Lying down to rest in the afternoon:3 Sitting and talking to someone:0 Sitting quietly after lunch (no alcohol):1 In a car, while stopped in traffic:0 Total: 11     Objective:  Neurological Exam  Physical Exam Physical Examination:   Vitals:   10/20/17 1541  BP: (!) 142/88  Pulse: (!) 56    General Examination: The patient is a very pleasant 62 y.o. male in no acute distress. He appears well-developed and well-nourished and well groomed.   HEENT: Normocephalic, atraumatic, pupils are equal, round and reactive to light and accommodation. Funduscopic exam is normal with sharp disc margins noted. Extraocular tracking is good without limitation to gaze excursion or nystagmus noted. Normal smooth pursuit is noted. Hearing is grossly intact. Tympanic membranes are clear bilaterally. Face is symmetric with normal facial animation and normal facial sensation. Speech is clear with no dysarthria noted. There is no hypophonia. There is no lip, neck/head, jaw or voice tremor. Neck is supple with full range of passive and active motion. There are no carotid bruits on auscultation. Oropharynx exam reveals: mild mouth dryness, adequate dental hygiene and moderate airway crowding secondary to smaller  airway entry and larger tongue. Mallampati is class II. Tongue protrudes centrally and palate elevates symmetrically. Uvula is absent, tonsils are absent. Neck circumference is 20-5/8 inches. He has a mild overbite.   Chest: Clear to auscultation without wheezing, rhonchi or crackles noted.  Heart: S1+S2+0, regular and normal without murmurs, rubs or gallops noted.   Abdomen: Soft, non-tender and non-distended with normal bowel sounds appreciated on auscultation.  Extremities: There is 1+ pitting edema in the distal lower extremities bilaterally.   Skin: Warm and dry without trophic changes noted.  Musculoskeletal: exam reveals no obvious joint deformities, tenderness or joint swelling or erythema.   Neurologically:  Mental status: The patient is awake, alert and oriented in all 4 spheres. His immediate and remote memory, attention, language skills and fund of knowledge are appropriate. There is no evidence of aphasia, agnosia, apraxia or anomia. Speech is clear with normal prosody and enunciation. Thought process is linear. Mood is normal and affect is normal.  Cranial nerves II - XII are as described above under HEENT exam. In addition: shoulder shrug is normal with equal shoulder height noted. Motor exam: Normal bulk, strength and tone is noted. There is no tremor. Fine motor skills and coordination:  intact with normal finger taps, normal hand movements, normal rapid alternating patting, normal foot taps and normal foot agility.  Cerebellar testing: No dysmetria or intention tremor. There is no truncal or gait ataxia.  Sensory exam: intact to light touch in the upper and lower extremities.  Gait, station and balance: He stands easily. No veering to one side is noted. No leaning to one side is noted. Posture is age-appropriate and stance is narrow based. Gait shows normal stride length and normal pace. No problems turning are noted.   Assessment and Plan:  In summary, SAMRAT HAYWARD is a  very pleasant 62 y.o.-year old male with an underlying medical history of vitamin D deficiency, hypertension, hyperlipidemia, recurrent kidney stones, insulin resistance, b/l lower extremity edema, arthritis, eczema, remote history of smoking and morbid obesity with BMI of 40, who was previously diagnosed with obstructive sleep apnea and placed on CPAP therapy. His machine is approximately 60 years old or maybe a little longer. It  works quite well for him. He needs new supplies. He is compliant with treatment, I reviewed his most recent CPAP compliance data. He is advised to try to strive for consistent usage and consistent sleep schedule. He reports that he averages about 4-5 hours of sleep typically. He is advised to continue with treatment. We will try to get a sleep study results from his prior sleep studies. We will try to get him established with a local DME company that is in network with his current insurance. He requested [facility]. Placed an order for CPAP related supplies, pressure at 10 cm is adequate currently with a residual AHI low at 0.7 per hour. He would be willing to proceed with a repeat sleep study if the need arises. For now, he will continue with the current treatment settings via nasal mask. I explained the importance of being compliant with PAP treatment, not only for insurance purposes but primarily to improve His symptoms, and for the patient's long term health benefit, including to reduce His cardiovascular risks. I would like to see him back routinely in 6 months, sooner if needed. I answered all their questions today and the patient and his wife were in agreement.  Thank you very much for allowing me to participate in the care of this nice patient. If I can be of any further assistance to you please do not hesitate to call me at [phone].  Sincerely,   Donald Foley, MD, PhD

## 2017-10-20 NOTE — Patient Instructions (Addendum)
I will prescribe CPAP supplies for your CPAP machine.  If we cannot get supplies based on your old sleep studies we may have to repeat sleep studies. I will order a study if needed. For now, continue using your CPAP.  Please continue using your CPAP regularly. While your insurance requires that you use CPAP at least 4 hours each night on 70% of the nights, I recommend, that you not skip any nights and use it throughout the night if you can. Getting used to CPAP and staying with the treatment long term does take time and patience and discipline. Untreated obstructive sleep apnea when it is moderate to severe can have an adverse impact on cardiovascular health and raise her risk for heart disease, arrhythmias, hypertension, congestive heart failure, stroke and diabetes. Untreated obstructive sleep apnea causes sleep disruption, nonrestorative sleep, and sleep deprivation. This can have an impact on your day to day functioning and cause daytime sleepiness and impairment of cognitive function, memory loss, mood disturbance, and problems focussing. Using CPAP regularly can improve these symptoms.  I will see you back in 6 months for sleep apnea check up, and if you continue to do well on CPAP I will see you once a year thereafter.

## 2017-10-25 ENCOUNTER — Telehealth: Payer: Self-pay

## 2017-11-04 ENCOUNTER — Encounter: Payer: Self-pay | Admitting: Family Medicine

## 2017-11-04 ENCOUNTER — Ambulatory Visit (INDEPENDENT_AMBULATORY_CARE_PROVIDER_SITE_OTHER): Payer: BLUE CROSS/BLUE SHIELD | Admitting: Family Medicine

## 2017-11-04 VITALS — BP 140/84 | HR 64 | Temp 98.6°F | Ht 70.0 in | Wt 279.0 lb

## 2017-11-04 DIAGNOSIS — E8881 Metabolic syndrome: Secondary | ICD-10-CM | POA: Diagnosis not present

## 2017-11-04 DIAGNOSIS — E88819 Insulin resistance, unspecified: Secondary | ICD-10-CM

## 2017-11-04 DIAGNOSIS — I10 Essential (primary) hypertension: Secondary | ICD-10-CM

## 2017-11-04 MED ORDER — DULAGLUTIDE 1.5 MG/0.5ML ~~LOC~~ SOAJ: 1.5000 mg | SUBCUTANEOUS | 6 refills | Status: DC

## 2017-11-04 NOTE — Patient Instructions (Signed)
You were started on Trulicity today. You have taken the first dose in the office of the .75mg  you will take the next .75mg  on Friday. If you have no side effects you will move up to the 1.5mg  to be taken every Friday after that. I have called in prescription for the higher dose into your pharmacy for when you are out of samples. If you have any questions give our office a call.

## 2017-11-04 NOTE — Progress Notes (Signed)
Donald Gilbert is a 62 y.o. male is here for follow up.  History of Present Illness:   Donald Gilbert, CMA acting as scribe for Dr. Helane RimaErica Miller [place].   HPI: Patient in office for follow up. He has been keeping log of his blood pressure readings and have ranged from 125/62 to 149/84. Has log with him today for review. Has been taking medication as directed with no problems.   Health Maintenance Due  Topic Date Due  . INFLUENZA VACCINE  10/13/2017   Depression screen PHQ 2/9 04/01/2017  Decreased Interest 2  Down, Depressed, Hopeless 0  PHQ - 2 Score 2  Altered sleeping 0  Tired, decreased energy 0  Change in appetite 0  Feeling bad or failure about yourself  3  Trouble concentrating 0  Moving slowly or fidgety/restless 0  Suicidal thoughts 0  PHQ-9 Score 5   PMHx, SurgHx, SocialHx, FamHx, Medications, and Allergies were reviewed in the Visit Navigator and updated as appropriate.   Patient Active Problem List   Diagnosis Date Noted  . Vitamin D deficiency 04/03/2017  . Morbid obesity (HCC) 04/03/2017  . Essential hypertension 04/03/2017  . Mixed hyperlipidemia 04/03/2017  . Recurrent kidney stones 04/03/2017  . Insulin resistance 04/03/2017  . Arthralgia of multiple joints 04/03/2017  . Atopic dermatitis 04/03/2017  . Urinary hesitancy 04/03/2017  . Dysthymia 04/03/2017   Social History   Tobacco Use  . Smoking status: Former Smoker    Last attempt to quit: 11/1989    Years since quitting: 27.9  . Smokeless tobacco: Former NeurosurgeonUser    Types: Chew    Quit date: 2001  Substance Use Topics  . Alcohol use: Yes    Comment: OCC   . Drug use: No   Current Medications and Allergies:   Current Outpatient Medications:  .  allopurinol (ZYLOPRIM) 300 MG tablet, TAKE 1 TABLET(300 MG) BY MOUTH DAILY, Disp: 30 tablet, Rfl: 0 .  aspirin EC 81 MG tablet, Take 1 tablet (81 mg total) by mouth 2 (two) times daily., Disp: 60 tablet, Rfl: 6 .  buPROPion (WELLBUTRIN XL) 150 MG 24 hr  tablet, Take 1 tablet (150 mg total) by mouth daily., Disp: 60 tablet, Rfl: 3 .  carvedilol (COREG) 12.5 MG tablet, Take 1 tablet (12.5 mg total) by mouth 2 (two) times daily., Disp: 180 tablet, Rfl: 2 .  Cholecalciferol 50000 units TABS, 50,000 units PO qwk for 12 weeks., Disp: 12 tablet, Rfl: 0 .  diclofenac (VOLTAREN) 75 MG EC tablet, TAKE 1 TABLET(75 MG) BY MOUTH TWICE DAILY, Disp: 180 tablet, Rfl: 0 .  losartan-hydrochlorothiazide (HYZAAR) 100-25 MG tablet, Take 1 tablet by mouth daily., Disp: , Rfl:  .  metFORMIN (GLUCOPHAGE-XR) 500 MG 24 hr tablet, Take 1 tablet (500 mg total) by mouth 2 (two) times daily., Disp: 180 tablet, Rfl: 2 .  simvastatin (ZOCOR) 40 MG tablet, Take 1 tablet (40 mg total) by mouth daily., Disp: 90 tablet, Rfl: 2 .  Dulaglutide (TRULICITY) 1.5 MG/0.5ML SOPN, Inject 1.5 mg into the skin once a week., Disp: 4 pen, Rfl: 6  No Known Allergies Review of Systems   Pertinent items are noted in the HPI. Otherwise, ROS is negative.  Vitals:   Vitals:   11/04/17 1446  BP: 140/84  Pulse: 64  Temp: 98.6 F (37 C)  TempSrc: Oral  SpO2: 96%  Weight: 279 lb (126.6 kg)  Height: 5\' 10"  (1.778 m)     Body mass index is 40.03 kg/m.  Physical  Exam:   Physical Exam  Constitutional: He is oriented to person, place, and time. He appears well-developed and well-nourished. No distress.  HENT:  Head: Normocephalic and atraumatic.  Right Ear: External ear normal.  Left Ear: External ear normal.  Nose: Nose normal.  Mouth/Throat: Oropharynx is clear and moist.  Eyes: Pupils are equal, round, and reactive to light. Conjunctivae and EOM are normal.  Neck: Normal range of motion. Neck supple.  Cardiovascular: Normal rate, regular rhythm, normal heart sounds and intact distal pulses.  Pulmonary/Chest: Effort normal and breath sounds normal.  Abdominal: Soft. Bowel sounds are normal.  Musculoskeletal: Normal range of motion.  Neurological: He is alert and oriented to  person, place, and time.  Skin: Skin is warm and dry.  Psychiatric: He has a normal mood and affect. His behavior is normal. Judgment and thought content normal.  Nursing note and vitals reviewed.   Results for orders placed or performed in visit on 06/27/17  Hepatitis C antibody  Result Value Ref Range   Hepatitis C Ab NON-REACTIVE NON-REACTI   SIGNAL TO CUT-OFF 0.01 <1.00  HIV antibody  Result Value Ref Range   HIV 1&2 Ab, 4th Generation NON-REACTIVE NON-REACTI  Microalbumin / creatinine urine ratio  Result Value Ref Range   Microalb, Ur 0.9 0.0 - 1.9 mg/dL   Creatinine,U 098.1 mg/dL   Microalb Creat Ratio 0.5 0.0 - 30.0 mg/g    Assessment and Plan:   Taejon was seen today for follow-up.  Diagnoses and all orders for this visit:  Essential hypertension Comments: Improved. Not at goal yet but patient would like to try weight loss before starting a new medication.  Morbid obesity (HCC) Comments: Trial Trulicity.  Orders: -     Dulaglutide (TRULICITY) 1.5 MG/0.5ML SOPN; Inject 1.5 mg into the skin once a week.  Insulin resistance Comments: Trial Trulicity. Orders: -     Dulaglutide (TRULICITY) 1.5 MG/0.5ML SOPN; Inject 1.5 mg into the skin once a week.    . Reviewed expectations re: course of current medical issues. . Discussed self-management of symptoms. . Outlined signs and symptoms indicating need for more acute intervention. . Patient verbalized understanding and all questions were answered. Marland Kitchen Health Maintenance issues including appropriate healthy diet, exercise, and smoking avoidance were discussed with patient. . See orders for this visit as documented in the electronic medical record. . Patient received an After Visit Summary.  CMA served as Neurosurgeon during this visit. History, Physical, and Plan performed by medical provider. The above documentation has been reviewed and is accurate and complete. Helane Rima, D.O.  Helane Rima, DO [place] [place]  [facility] 11/05/2017

## 2017-11-05 LAB — HM DIABETES EYE EXAM

## 2017-12-14 ENCOUNTER — Other Ambulatory Visit: Payer: Self-pay | Admitting: Family Medicine

## 2017-12-14 DIAGNOSIS — M2550 Pain in unspecified joint: Secondary | ICD-10-CM

## 2017-12-16 ENCOUNTER — Other Ambulatory Visit: Payer: Self-pay | Admitting: Family Medicine

## 2017-12-16 DIAGNOSIS — N200 Calculus of kidney: Secondary | ICD-10-CM

## 2017-12-17 ENCOUNTER — Other Ambulatory Visit: Payer: Self-pay | Admitting: Family Medicine

## 2017-12-17 DIAGNOSIS — E782 Mixed hyperlipidemia: Secondary | ICD-10-CM

## 2017-12-19 ENCOUNTER — Telehealth: Payer: Self-pay

## 2017-12-19 MED ORDER — ALLOPURINOL 300 MG PO TABS: 300.0000 mg | ORAL_TABLET | Freq: Every day | ORAL | 0 refills | Status: DC

## 2017-12-19 NOTE — Telephone Encounter (Signed)
Patient informed gave samples to wife.

## 2017-12-19 NOTE — Telephone Encounter (Signed)
Okay to change to Ozempic.

## 2017-12-19 NOTE — Telephone Encounter (Signed)
Patient called states that he had been on Trulicity for the first few weeks he could tell a big change. Now he does not see where it is helping at all. He is on the 1.5mg . Wanted to know if he could go up on dose or change to something else.

## 2018-01-12 ENCOUNTER — Encounter: Payer: Self-pay | Admitting: Neurology

## 2018-01-12 ENCOUNTER — Telehealth: Payer: Self-pay | Admitting: Neurology

## 2018-01-12 DIAGNOSIS — G4733 Obstructive sleep apnea (adult) (pediatric): Secondary | ICD-10-CM

## 2018-01-12 DIAGNOSIS — Z6841 Body Mass Index (BMI) 40.0 and over, adult: Secondary | ICD-10-CM

## 2018-01-12 DIAGNOSIS — Z9989 Dependence on other enabling machines and devices: Principal | ICD-10-CM

## 2018-01-12 NOTE — Telephone Encounter (Signed)
Pts wife Natasha Mead on [place] called stating that Donald Gilbert did not get the supply list Dr. Frances Furbish ordered. Requesting a call to advise

## 2018-01-12 NOTE — Telephone Encounter (Addendum)
I refaxed the order. Received a receipt of confirmation.  I called [facility] to discuss, spoke with Crystal. She reports that she can't find the pt in their system. She says that they aren't able to see pt's charts from [place]. She will need demographics, insurance, PSG, and the order for cpap supplies to help the pt.  I called pt's wife. She reports that they have never been a pt of [facility]. I explained to her that pt may need another sleep study since the only one in our system is a titration study, which will not work. She asked me to call [facility] to get the sleep studies. She will be coming up today to have his cpap chip downloaded per DOT requirement.  I called [facility] at [phone] and left message for MR to send [place] all sleep studies.

## 2018-01-16 NOTE — Telephone Encounter (Signed)
I reviewed his CPAP compliance data from 10/15/2017 through 01/12/2018 which is a total of 90 days, during which time patient used his CPAP 89 days with percent used days greater than 4 hours and 82.2%, indicating very good compliance with an average usage of 4 hours and 35 minutes only for days on treatment. Residual AHI 0.5 per hour, at goal, leak low, pressure set at 10 cm. Patient has been compliant with CPAP. Nevertheless, to qualify for new equipment he will need updated sleep study testing. I will order sleep study, please call pt to schedule.

## 2018-01-16 NOTE — Telephone Encounter (Signed)
Received a call from [facility] from [facility]. They only have titration studies for this pt. She cannot find a baseline sleep study.

## 2018-01-16 NOTE — Telephone Encounter (Signed)
I called pt's wife, advised her that Shelly Coss reports that they don't have a baseline sleep study on this pt. Pt's wife thinks that pt should just go ahead and repeat a PSG. Pt's wife asked me to call the pt to discuss this further.  I called pt. He thinks his baseline sleep study was done in [place] but he can't remember the name of the center nor exactly when it was, other than it was more than 5 years ago. Pt is requesting a new sleep study. I explained that our sleep lab will check with his insurance regarding in-lab or HST coverage and call the pt to schedule. Pt verbalized understanding.

## 2018-01-16 NOTE — Addendum Note (Signed)
Addended by: Huston Foley on: 01/16/2018 04:35 PM   Modules accepted: Orders

## 2018-01-16 NOTE — Telephone Encounter (Signed)
I called pt's wife, Natasha Mead, per DPR, to discuss. No answer, left a message asking her to call me back.

## 2018-01-27 ENCOUNTER — Other Ambulatory Visit: Payer: Self-pay

## 2018-01-27 MED ORDER — PREDNISONE 5 MG PO TABS: 5.0000 mg | ORAL_TABLET | Freq: Every day | ORAL | 0 refills | Status: DC

## 2018-02-07 ENCOUNTER — Ambulatory Visit (INDEPENDENT_AMBULATORY_CARE_PROVIDER_SITE_OTHER): Payer: BLUE CROSS/BLUE SHIELD | Admitting: Neurology

## 2018-02-07 DIAGNOSIS — Z9989 Dependence on other enabling machines and devices: Secondary | ICD-10-CM

## 2018-02-07 DIAGNOSIS — Z6841 Body Mass Index (BMI) 40.0 and over, adult: Secondary | ICD-10-CM

## 2018-02-07 DIAGNOSIS — G4733 Obstructive sleep apnea (adult) (pediatric): Secondary | ICD-10-CM

## 2018-02-14 ENCOUNTER — Telehealth: Payer: Self-pay

## 2018-02-14 NOTE — Telephone Encounter (Signed)
-----   Message from Huston FoleySaima Athar, MD sent at 02/14/2018  8:26 AM EST ----- Patient referred by Dr. Earlene PlaterWallace, seen by me on 10/20/17, split night sleep study on 02/07/18. Please call and notify patient that the recent sleep study confirmed the diagnosis of severe OSA. He did well with CPAP during the study with significant improvement of the respiratory events. Therefore, I would like to prescribe new equipment and a new CPAP machine, he does not have a current DME. I placed the order in the chart.  Please advise patient that we need a follow up appointment with either myself or one of our nurse practitioners in about 10 weeks post set-up to check for how the patient is feeling and how well the patient is using the machine, etc. Please go ahead and schedule the appointment, while you have the patient on the phone and make sure patient understands the importance of keeping this window for the FU appointment, as it is often an insurance requirement. Failing to adhere to this may result in losing coverage for sleep apnea treatment, at which point most patients are left with a choice of returning the machine or paying out of pocket (and we want neither of this to happen!).  Please re-enforce the importance of compliance with treatment and the need for us to monitor compliance data - again an insurance requirement and usually a good feedback for the patient as far as how they are doing.  Also remind patient, that any PAP machine or mask issues should be first addressed with the DME company, who provided the machine/mask.  Please ask if patient has a preference regarding DME company, may depend on the insurance too.  Please arrange for CPAP set up at home through a DME company of patient's choice.  Once you have spoken to the patient you can close the phone encounter. Please fax/route report to referring provider, thanks,   Huston FoleySaima Athar, MD, PhD [facility])

## 2018-02-14 NOTE — Procedures (Signed)
PATIENT'S NAME:  Donald Gilbert, Donald Gilbert DOB:      1955/07/30      MR#:    [id number]     DATE OF RECORDING: 02/07/2018 REFERRING M.D.:  Helane Rima Do Study Performed:  Split-Night Titration Study HISTORY: 62 year old man with a history of vitamin D deficiency, hypertension, hyperlipidemia, recurrent kidney stones, insulin resistance, b/l lower extremity edema, arthritis, eczema, remote history of smoking and morbid obesity, who presents for re-evaluation of his OSA. He needs new equipment. The patient endorsed the Epworth Sleepiness Scale at 11 points. The patient's weight 280 pounds with a height of 70 (inches), resulting in a BMI of 40.1 kg/m2. The patient's neck circumference measured 20.5 inches.  CURRENT MEDICATIONS: Zyloprim, Aspirin, Wellbutrin, Coreg, Voltaren, Hyzaar, Metformin, Zocor   PROCEDURE:  This is a multichannel digital polysomnogram utilizing the Somnostar 11.2 system.  Electrodes and sensors were applied and monitored per AASM Specifications.   EEG, EOG, Chin and Limb EMG, were sampled at 200 Hz.  ECG, Snore and Nasal Pressure, Thermal Airflow, Respiratory Effort, CPAP Flow and Pressure, Oximetry was sampled at 50 Hz. Digital video and audio were recorded.      BASELINE STUDY WITHOUT CPAP RESULTS:  Lights Out was at 21:37 and Lights On at 04:58 for the night, split study start at 00:57, epoch 407. Total recording time (TRT) was 199.5, with a total sleep time (TST) of 122 minutes. The patient's sleep latency was 20 minutes. REM sleep was absent. The sleep efficiency was 61.2 %.    SLEEP ARCHITECTURE: WASO (Wake after sleep onset) was 70 minutes, Stage N1 was 15 minutes, Stage N2 was 82 minutes, Stage N3 was 25 minutes and Stage R (REM sleep) was 0 minutes.  The percentages were Stage N1 12.3%, Stage N2 67.2%, both increased, Stage N3 20.5% and Stage R (REM sleep) was absent. The arousals were noted as: 9 were spontaneous, 0 were associated with PLMs, 18 were associated with respiratory  events.  RESPIRATORY ANALYSIS:  There were a total of 104 respiratory events:  50 obstructive apneas, 0 central apneas and 0 mixed apneas with a total of 50 apneas and an apnea index (AI) of 24.6. There were 54 hypopneas with a hypopnea index of 26.6. The patient also had 1 respiratory event related arousals (RERAs).  Snoring was noted.     The total APNEA/HYPOPNEA INDEX (AHI) was 51.1 /hour and the total RESPIRATORY DISTURBANCE INDEX was 51.6 /hour.  0 events occurred in REM sleep and 108 events in NREM. The REM AHI was 0, /hour versus a non-REM AHI of 51.1 /hour. The patient spent 146 minutes sleep time in the supine position 171 minutes in non-supine. The supine AHI was 85.3 /hour versus a non-supine AHI of 33.5 /hour.  OXYGEN SATURATION & C02:  The wake baseline 02 saturation was 95%, with the lowest being 81%. Time spent below 89% saturation equaled 29 minutes.  PERIODIC LIMB MOVEMENTS: The patient had a total of 17 Periodic Limb Movements.  The Periodic Limb Movement (PLM) index was 8.4/hour and the PLM Arousal index was 0/hour.  Audio and video analysis did not show any abnormal or unusual movements, behaviors, phonations or vocalizations. The patient took no bathroom breaks. Loud snoring was noted. The EKG was in keeping with normal sinus rhythm (NSR).   TITRATION STUDY WITH CPAP RESULTS:   The patient was fitted with a Mirage FX nasal mask. CPAP was initiated at 8 cmH20 with heated humidity per AASM split night standards and pressure was advanced to  11 cmH20 because of hypopneas, apneas and desaturations.  At a PAP pressure of 11 cmH20, there was a reduction of the AHI to 0/hour with supine NREM sleep achieved and O2 nadir of 93%.    Total recording time (TRT) was 241.5 minutes, with a total sleep time (TST) of 194.5 minutes. The patient's sleep latency was 10 minutes. REM latency was 4.5 minutes.  The sleep efficiency was 80.5 %.    SLEEP ARCHITECTURE: Wake after sleep was 26.5 minutes,  Stage N1 12.5 minutes, Stage N2 41 minutes, Stage N3 52.5 minutes and Stage R (REM sleep) 88.5 minutes. The percentages were: Stage N1 6.4%, Stage N2 21.1%, Stage N3 27.% and Stage R (REM sleep) 45.5%, which is increased and in keeping with rebound. The arousals were noted as: 38 were spontaneous, 0 were associated with PLMs, 0 were associated with respiratory events.  RESPIRATORY ANALYSIS:  There were a total of 2 respiratory events: 0 obstructive apneas, 0 central apneas and 0 mixed apneas with a total of 0 apneas and an apnea index (AI) of 0. There were 2 hypopneas with a hypopnea index of .6 /hour. The patient also had 0 respiratory event related arousals (RERAs).      The total APNEA/HYPOPNEA INDEX (AHI) was .6 /hour and the total RESPIRATORY DISTURBANCE INDEX was .6 /hour.  1 events occurred in REM sleep and 1 events in NREM. The REM AHI was .7 /hour versus a non-REM AHI of .6 /hour. REM sleep was achieved on a pressure of  cm/h2o (AHI was  .) The patient spent 54% of total sleep time in the supine position. The supine AHI was 1.1 /hour, versus a non-supine AHI of 0.0/hour.  OXYGEN SATURATION & C02:  The wake baseline 02 saturation was 95%, with the lowest being 77%. Time spent below 89% saturation equaled 1 minutes.  PERIODIC LIMB MOVEMENTS: The patient had a total of 0 Periodic Limb Movements. The Periodic Limb Movement (PLM) index was 0 /hour and the PLM Arousal index was 0 /hour.  Post-study, the patient indicated that sleep was worse than usual.  POLYSOMNOGRAPHY IMPRESSION :   1. Obstructive Sleep Apnea(OSA)  2. Dysfunctions associated with sleep stages or arousals from sleep RECOMMENDATIONS:  1. This patient has severe obstructive sleep apnea and responded well on CPAP therapy. I will, therefore, start the patient on home CPAP treatment at a pressure of 11 cm via nasal mask of choice, with heated humidity. The patient should be reminded to be fully compliant with PAP therapy to improve  sleep related symptoms and decrease long term cardiovascular risks. Please note that untreated obstructive sleep apnea may carry additional perioperative morbidity. Patients with significant obstructive sleep apnea should receive perioperative PAP therapy and the surgeons and particularly the anesthesiologist should be informed of the diagnosis and the severity of the sleep disordered breathing. 2. This study shows sleep fragmentation and abnormal sleep stage percentages; these are nonspecific findings and per se do not signify an intrinsic sleep disorder or a cause for the patient's sleep-related symptoms. Causes include (but are not limited to) the first night effect of the sleep study, circadian rhythm disturbances, medication effect or an underlying mood disorder or medical problem.  3. The patient should be cautioned not to drive, work at heights, or operate dangerous or heavy equipment when tired or sleepy. Review and reiteration of good sleep hygiene measures should be pursued with any patient. 4. The patient will be seen in follow-up in the sleep clinic at [facility] for  discussion of the test results, symptom and treatment compliance review, further management strategies, etc. The referring provider will be notified of the test results.  I certify that I have reviewed the entire raw data recording prior to the issuance of this report in accordance with the Standards of Accreditation of the American Academy of Sleep Medicine (AASM)   Huston FoleySaima Rasheda Ledger, MD, PhD Diplomat, American Board of Neurology and Sleep Medicine (Neurology and Sleep Medicine)

## 2018-02-14 NOTE — Telephone Encounter (Signed)
I called pt. I advised pt that Dr. Frances FurbishAthar reviewed their sleep study results and found that pt has severe osa but did well with the cpap during his latest sleep study. Dr. Frances FurbishAthar recommends that pt start a new cpap at home. I reviewed PAP compliance expectations with the pt. Pt is agreeable to starting a CPAP. I advised pt that an order will be sent to a DME, [facility], and [facility] will call the pt within about one week after they file with the pt's insurance. [facility] will show the pt how to use the machine, fit for masks, and troubleshoot the CPAP if needed. A follow up appt was made for insurance purposes with Dr. Frances FurbishAthar on 04/27/18 at 3:00pm. Pt verbalized understanding to arrive 15 minutes early and bring their CPAP. A letter with all of this information in it will be mailed to the pt as a reminder. I verified with the pt that the address we have on file is correct. Pt verbalized understanding of results. Pt had no questions at this time but was encouraged to call back if questions arise. I have sent the order to [facility] and have received confirmation that they have received the order.

## 2018-02-14 NOTE — Addendum Note (Signed)
Addended by: Huston FoleyATHAR, Steele Ledonne on: 02/14/2018 08:26 AM   Modules accepted: Orders

## 2018-02-14 NOTE — Progress Notes (Signed)
Patient referred by Dr. Earlene PlaterWallace, seen by me on 10/20/17, split night sleep study on 02/07/18. Please call and notify patient that the recent sleep study confirmed the diagnosis of severe OSA. He did well with CPAP during the study with significant improvement of the respiratory events. Therefore, I would like to prescribe new equipment and a new CPAP machine, he does not have a current DME. I placed the order in the chart.  Please advise patient that we need a follow up appointment with either myself or one of our nurse practitioners in about 10 weeks post set-up to check for how the patient is feeling and how well the patient is using the machine, etc. Please go ahead and schedule the appointment, while you have the patient on the phone and make sure patient understands the importance of keeping this window for the FU appointment, as it is often an insurance requirement. Failing to adhere to this may result in losing coverage for sleep apnea treatment, at which point most patients are left with a choice of returning the machine or paying out of pocket (and we want neither of this to happen!).  Please re-enforce the importance of compliance with treatment and the need for us to monitor compliance data - again an insurance requirement and usually a good feedback for the patient as far as how they are doing.  Also remind patient, that any PAP machine or mask issues should be first addressed with the DME company, who provided the machine/mask.  Please ask if patient has a preference regarding DME company, may depend on the insurance too.  Please arrange for CPAP set up at home through a DME company of patient's choice.  Once you have spoken to the patient you can close the phone encounter. Please fax/route report to referring provider, thanks,   Huston FoleySaima Merdith Adan, MD, PhD [facility])

## 2018-02-24 ENCOUNTER — Ambulatory Visit (INDEPENDENT_AMBULATORY_CARE_PROVIDER_SITE_OTHER): Payer: BLUE CROSS/BLUE SHIELD | Admitting: Family Medicine

## 2018-02-24 ENCOUNTER — Encounter: Payer: Self-pay | Admitting: Family Medicine

## 2018-02-24 VITALS — BP 136/82 | HR 64 | Temp 98.6°F | Ht 70.0 in | Wt 278.0 lb

## 2018-02-24 DIAGNOSIS — I10 Essential (primary) hypertension: Secondary | ICD-10-CM

## 2018-02-24 DIAGNOSIS — E88819 Insulin resistance, unspecified: Secondary | ICD-10-CM

## 2018-02-24 DIAGNOSIS — L821 Other seborrheic keratosis: Secondary | ICD-10-CM

## 2018-02-24 DIAGNOSIS — E8881 Metabolic syndrome: Secondary | ICD-10-CM

## 2018-02-24 NOTE — Progress Notes (Signed)
Donald Gilbert is a 62 y.o. male is here for follow up.  History of Present Illness:   HPI:  Patient would like skin checked today.  There are no preventive care reminders to display for this patient. Depression screen PHQ 2/9 04/01/2017  Decreased Interest 2  Down, Depressed, Hopeless 0  PHQ - 2 Score 2  Altered sleeping 0  Tired, decreased energy 0  Change in appetite 0  Feeling bad or failure about yourself  3  Trouble concentrating 0  Moving slowly or fidgety/restless 0  Suicidal thoughts 0  PHQ-9 Score 5   PMHx, SurgHx, SocialHx, FamHx, Medications, and Allergies were reviewed in the Visit Navigator and updated as appropriate.   Patient Active Problem List   Diagnosis Date Noted  . Vitamin D deficiency 04/03/2017  . Morbid obesity ([place]) 04/03/2017  . Essential hypertension 04/03/2017  . Mixed hyperlipidemia 04/03/2017  . Recurrent kidney stones 04/03/2017  . Insulin resistance 04/03/2017  . Arthralgia of multiple joints 04/03/2017  . Atopic dermatitis 04/03/2017  . Urinary hesitancy 04/03/2017  . Dysthymia 04/03/2017   Social History   Tobacco Use  . Smoking status: Former Smoker    Last attempt to quit: 11/1989    Years since quitting: 28.3  . Smokeless tobacco: Former Systems developer    Types: Chew    Quit date: 2001  Substance Use Topics  . Alcohol use: Yes    Comment: OCC   . Drug use: No   Current Medications and Allergies:   Current Outpatient Medications:  .  allopurinol (ZYLOPRIM) 300 MG tablet, Take 1 tablet (300 mg total) by mouth daily., Disp: 90 tablet, Rfl: 0 .  aspirin EC 81 MG tablet, Take 1 tablet (81 mg total) by mouth 2 (two) times daily., Disp: 60 tablet, Rfl: 6 .  buPROPion (WELLBUTRIN XL) 150 MG 24 hr tablet, Take 1 tablet (150 mg total) by mouth daily., Disp: 60 tablet, Rfl: 3 .  carvedilol (COREG) 12.5 MG tablet, Take 1 tablet (12.5 mg total) by mouth 2 (two) times daily., Disp: 180 tablet, Rfl: 2 .  Cholecalciferol 50000 units TABS, 50,000  units PO qwk for 12 weeks., Disp: 12 tablet, Rfl: 0 .  diclofenac (VOLTAREN) 75 MG EC tablet, TAKE 1 TABLET(75 MG) BY MOUTH TWICE DAILY, Disp: 180 tablet, Rfl: 0 .  Dulaglutide (TRULICITY) 1.5 WI/0.9BD SOPN, Inject 1.5 mg into the skin once a week., Disp: 4 pen, Rfl: 6 .  losartan-hydrochlorothiazide (HYZAAR) 100-25 MG tablet, Take 1 tablet by mouth daily., Disp: , Rfl:  .  metFORMIN (GLUCOPHAGE-XR) 500 MG 24 hr tablet, Take 1 tablet (500 mg total) by mouth 2 (two) times daily., Disp: 180 tablet, Rfl: 2 .  predniSONE (DELTASONE) 5 MG tablet, Take 1 tablet (5 mg total) by mouth daily with breakfast. 6-5-4-3-2-1-, Disp: 21 tablet, Rfl: 0 .  simvastatin (ZOCOR) 40 MG tablet, TAKE 1 TABLET(40 MG) BY MOUTH DAILY, Disp: 90 tablet, Rfl: 2  No Known Allergies Review of Systems   Pertinent items are noted in the HPI. Otherwise, a complete ROS is negative.  Vitals:   Vitals:   02/24/18 1515  BP: 136/82  Pulse: 64  Temp: 98.6 F (37 C)  TempSrc: Oral  SpO2: 97%  Weight: 278 lb (126.1 kg)  Height: '5\' 10"'  (1.778 m)     Body mass index is 39.89 kg/m.  Physical Exam:   Physical Exam Constitutional:      General: He is not in acute distress.    Appearance: He  is well-developed. He is not diaphoretic.  HENT:     Head: Normocephalic and atraumatic.     Right Ear: External ear normal.     Left Ear: External ear normal.     Nose: Nose normal.  Eyes:     Conjunctiva/sclera: Conjunctivae normal.  Neck:     Musculoskeletal: Normal range of motion.  Cardiovascular:     Rate and Rhythm: Normal rate and regular rhythm.  Pulmonary:     Effort: Pulmonary effort is normal. No respiratory distress.  Abdominal:     General: Bowel sounds are normal.     Palpations: Abdomen is soft.     Tenderness: There is no abdominal tenderness.  Musculoskeletal: Normal range of motion.        General: No deformity.  Skin:    General: Skin is warm.     Comments: SKs on back and scalp.  Neurological:      Mental Status: He is alert and oriented to person, place, and time.  Psychiatric:        Behavior: Behavior normal.    Assessment and Plan:   Donald Gilbert was seen today for follow-up.  Diagnoses and all orders for this visit:  Insulin resistance -     Hemoglobin A1c -     Comp Met (CMET)  Essential hypertension -     Hemoglobin A1c -     Comp Met (CMET)   . Orders and follow up as documented in [place], reviewed diet, exercise and weight control, cardiovascular risk and specific lipid/LDL goals reviewed, reviewed medications and side effects in detail.  . Reviewed expectations re: course of current medical issues. . Outlined signs and symptoms indicating need for more acute intervention. . Patient verbalized understanding and all questions were answered. . Patient received an After Visit Summary.   Briscoe Deutscher, DO [place], [facility] 02/26/2018

## 2018-02-25 LAB — COMPREHENSIVE METABOLIC PANEL
AG Ratio: 1.7 (calc) (ref 1.0–2.5)
ALT: 39 U/L (ref 9–46)
AST: 20 U/L (ref 10–35)
Albumin: 4.3 g/dL (ref 3.6–5.1)
Alkaline phosphatase (APISO): 74 U/L (ref 40–115)
BUN: 23 mg/dL (ref 7–25)
CO2: 30 mmol/L (ref 20–32)
Calcium: 9.7 mg/dL (ref 8.6–10.3)
Chloride: 99 mmol/L (ref 98–110)
Creat: 1.19 mg/dL (ref 0.70–1.33)
Globulin: 2.5 g/dL (calc) (ref 1.9–3.7)
Glucose, Bld: 101 mg/dL — ABNORMAL HIGH (ref 65–99)
Potassium: 4 mmol/L (ref 3.5–5.3)
Sodium: 138 mmol/L (ref 135–146)
Total Bilirubin: 0.6 mg/dL (ref 0.2–1.2)
Total Protein: 6.8 g/dL (ref 6.1–8.1)

## 2018-02-25 LAB — HEMOGLOBIN A1C
Hgb A1c MFr Bld: 5.8 % of total Hgb — ABNORMAL HIGH (ref <5.7)
Mean Plasma Glucose: 120 (calc)
eAG (mmol/L): 6.6 (calc)

## 2018-02-26 ENCOUNTER — Encounter: Payer: Self-pay | Admitting: Family Medicine

## 2018-03-03 DIAGNOSIS — G4733 Obstructive sleep apnea (adult) (pediatric): Secondary | ICD-10-CM | POA: Diagnosis not present

## 2018-03-14 ENCOUNTER — Other Ambulatory Visit: Payer: Self-pay | Admitting: Family Medicine

## 2018-03-14 ENCOUNTER — Other Ambulatory Visit: Payer: Self-pay

## 2018-03-14 ENCOUNTER — Telehealth: Payer: Self-pay | Admitting: Family Medicine

## 2018-03-14 DIAGNOSIS — M2550 Pain in unspecified joint: Secondary | ICD-10-CM

## 2018-03-14 MED ORDER — OSELTAMIVIR PHOSPHATE 75 MG PO CAPS: 75.0000 mg | ORAL_CAPSULE | Freq: Every day | ORAL | 0 refills | Status: DC

## 2018-03-14 NOTE — Telephone Encounter (Signed)
Noted. Agree with all.

## 2018-03-14 NOTE — Telephone Encounter (Signed)
Called patient let him know that we have stared referral. He asked that we call in the Tamiflu for him and his wife. They were with daughter (sam) who just tested positive for flu. I have called in for both him and his wife. For the 75mg  for 5 days.

## 2018-03-14 NOTE — Telephone Encounter (Signed)
Copied from CRM [phone]. Topic: General - Other >> Mar 14, 2018 11:56 AM Debroah LoopLander, Lumin L wrote: Reason for CRM: Donald MeadJeri, wife, calling to check on status of getting approval for weight loss surgery which was discussed at the pt last appt?

## 2018-04-03 DIAGNOSIS — G4733 Obstructive sleep apnea (adult) (pediatric): Secondary | ICD-10-CM | POA: Diagnosis not present

## 2018-04-15 ENCOUNTER — Encounter: Payer: Self-pay | Admitting: Family Medicine

## 2018-04-16 ENCOUNTER — Encounter: Payer: Self-pay | Admitting: Family Medicine

## 2018-04-16 ENCOUNTER — Other Ambulatory Visit: Payer: Self-pay | Admitting: Family Medicine

## 2018-04-16 DIAGNOSIS — I10 Essential (primary) hypertension: Secondary | ICD-10-CM

## 2018-04-16 MED ORDER — DULAGLUTIDE 0.75 MG/0.5ML ~~LOC~~ SOAJ: SUBCUTANEOUS | 4 refills | Status: DC

## 2018-04-16 NOTE — Telephone Encounter (Signed)
Date: 04/16/2018   Doctor's Name:Bauer Ausborn Earlene Plater, D.O. Family Medicine [facility], [facility]  Re: Donald Gilbert DOB: May 06, 1955  To Whom It May Concern:  The above named patient has been seen by our office since 2018. He suffers from the following co-morbidities:   Patient Active Problem List   Diagnosis Date Noted  . Vitamin D deficiency 04/03/2017  . Morbid obesity ([facility]) 04/03/2017  . Essential hypertension 04/03/2017  . Mixed hyperlipidemia 04/03/2017  . Recurrent kidney stones 04/03/2017  . Insulin resistance 04/03/2017  . Arthralgia of multiple joints 04/03/2017  . Atopic dermatitis 04/03/2017  . Urinary hesitancy 04/03/2017  . Dysthymia 04/03/2017   His current weight is: 278 pounds, height is 5 foot 10 inches, and BMI is 39.89.    The patient has undergone the following weight loss attempts: Weight Watchers, Slim Fast, Low Fat Diet, and Medical Assisted Weight Loss.   I feel this patient would benefit from weight loss surgery because his medical conditions will become life threatining if he does not get his weight under control.   I appreciate your consideration. Please contact me for further questions.   Helane Rima, D.O. Family Medicine [employer], [facility]

## 2018-04-18 NOTE — Telephone Encounter (Signed)
Sample given to daughter will call about order

## 2018-04-27 ENCOUNTER — Ambulatory Visit: Payer: BLUE CROSS/BLUE SHIELD | Admitting: Neurology

## 2018-05-04 ENCOUNTER — Ambulatory Visit: Payer: BLUE CROSS/BLUE SHIELD | Admitting: Family Medicine

## 2018-05-04 DIAGNOSIS — Z9989 Dependence on other enabling machines and devices: Secondary | ICD-10-CM | POA: Diagnosis not present

## 2018-05-04 DIAGNOSIS — G4733 Obstructive sleep apnea (adult) (pediatric): Secondary | ICD-10-CM | POA: Diagnosis not present

## 2018-05-04 DIAGNOSIS — E785 Hyperlipidemia, unspecified: Secondary | ICD-10-CM | POA: Diagnosis not present

## 2018-05-04 DIAGNOSIS — R5383 Other fatigue: Secondary | ICD-10-CM | POA: Diagnosis not present

## 2018-05-05 ENCOUNTER — Other Ambulatory Visit (HOSPITAL_COMMUNITY): Payer: Self-pay | Admitting: General Surgery

## 2018-05-05 ENCOUNTER — Other Ambulatory Visit: Payer: Self-pay | Admitting: General Surgery

## 2018-05-05 ENCOUNTER — Other Ambulatory Visit: Payer: Self-pay

## 2018-05-05 DIAGNOSIS — Z1211 Encounter for screening for malignant neoplasm of colon: Secondary | ICD-10-CM

## 2018-05-08 ENCOUNTER — Ambulatory Visit: Payer: Self-pay | Admitting: Family Medicine

## 2018-05-08 ENCOUNTER — Telehealth: Payer: Self-pay

## 2018-05-08 NOTE — Telephone Encounter (Signed)
Patient was a no call/no show for their appointment today.   

## 2018-05-09 ENCOUNTER — Encounter: Payer: Self-pay | Admitting: Family Medicine

## 2018-05-09 LAB — TSH: TSH: 1.93 (ref 0.41–5.90)

## 2018-05-09 LAB — VITAMIN D 25 HYDROXY (VIT D DEFICIENCY, FRACTURES): Vit D, 25-Hydroxy: 11

## 2018-05-09 LAB — LIPID PANEL
Cholesterol: 157 (ref 0–200)
HDL: 37 (ref 35–70)
LDL Cholesterol: 93
Triglycerides: 171 — AB (ref 40–160)

## 2018-05-11 ENCOUNTER — Encounter: Payer: Self-pay | Admitting: Gastroenterology

## 2018-05-12 ENCOUNTER — Encounter: Payer: BLUE CROSS/BLUE SHIELD | Attending: Family Medicine | Admitting: Dietician

## 2018-05-12 ENCOUNTER — Encounter: Payer: Self-pay | Admitting: Dietician

## 2018-05-12 VITALS — Ht 70.5 in | Wt 278.7 lb

## 2018-05-12 DIAGNOSIS — E669 Obesity, unspecified: Secondary | ICD-10-CM | POA: Diagnosis not present

## 2018-05-12 NOTE — Progress Notes (Signed)
Bariatric Pre-Op Nutrition Assessment Medical Nutrition Therapy  Appt Start Time: 10:15am  End time: 11:15am  Patient was seen on 05/12/2018 for Pre-Operative Nutrition Assessment. Assessment and letter of approval faxed to [facility] Bariatric Surgery Program coordinator on 05/12/2018.   Planned surgery: Sleeve Gastrectomy  Pt expectation of surgery: to be more mobile (and keep up with granddaughter), relieve pain, and feel better Pt expectation of dietitian: none stated  Anthropometrics  Start weight at [facility]: 278.7 lbs (date: 05/12/2018) Height: 70.5 in BMI: 39.42 kg/m2    Clinical  Medical Hx: obesity, hypertriglyceridemia, HTN, prediabetes, OSA, vitamin D deficiency, osteoarthritis  Surgeries: hernia repair, tonsillectomy  Medications: Trulicity, aspirin, allopurinol, losartan, carvedilol, simvastatin, bupropion, diclofenac  Allergies: N/A  Psychosocial/Lifestyle Pt lives with his wife, they moved to [place] within the past few years to be closer to their daughter and her family (32 year old granddaughter.) 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 Pt came to visit today with his wife. Both were very attentive during the visit and asked many well informed questions.   24-Hr Dietary Recall First Meal: water  Snack: protein shake + vitamin drink w/ caffeine  Second Meal: PB&J (or ham + cheese sandwich)  Snack: protein shake  Third Meal: baked meat (meatloaf, chicken) + side (or soup/chili or roast in the crock pot)  Snack: sugar-free brownie, pie, cake  Beverages: water, Premier Protein shake, vitamin water 100kcal, half & half   Food & Nutrition Related Hx Dietary Hx: Pt states he dislikes [place] and roast beef. Pt's wife states he does not eat many vegetables, but is a "meat and potatoes" guy. Pt states he used to drink lots of Mtn Dew, then switched to Diet Mtn Dew (6/day,) but now does not drink soda (since doing  Whole 30 diet about 3 years ago.) Pt states he would like snack and meal ideas to accommodate his work schedule/lifestyle. Pt states he has a sweet tooth.  Estimated Daily Fluid Intake: 2 bottles water + protein shakes + vitamin water + half & half tea oz Supplements: none GI / Other Notable Symptoms: constipation   Physical Activity  Current average weekly physical activity: walking   Estimated Energy Needs Calories: 2000 Carbohydrate: 225g Protein: 150g Fat: 55g  Pre-Op Goals Reviewed with the Patient . Track food and beverage intake (try MyFitness Pal or the Baritastic app) . Make healthy food choices while monitoring portion sizes . Avoid concentrated sugars and fried foods . Keep fat & sugar in the single digits per serving on food labels . Practice CHEWING your food (aim for applesauce consistency) . Practice not drinking 15 minutes before, during, and 30 minutes after each meal and snack . Avoid all carbonated beverages (ex: soda, sparkling beverages)  . Limit caffeinated beverages (ex: coffee, tea, energy drinks) . Avoid all sugar-sweetened beverages (ex: regular soda, sports drinks)  . Avoid alcohol  . Consume 3 meals per day or try to eat every 3-5 hours . Make a list of non-food related activities . Aim for 64-100 ounces of FLUID daily (with at least half of fluid intake being plain water)  . Aim for at least 60-80 grams of PROTEIN daily . Look for a liquid protein source that contains ?15 g protein and ?5 g carbohydrate (ex: shakes, drinks, shots) . Physical activity is an important part of a healthy lifestyle so keep it moving! The goal is to reach 150 minutes of exercise per  week, including cardiovascular and weight baring activity.  Handouts Provided Include  . Bariatric Surgery handouts (Nutrition Visits, Pre-Op Goals, Protein Shakes, Vitamins & Minerals, Support Group 2020 Schedule)  Learning Style & Readiness for Change Teaching method utilized: Visual & Auditory   Demonstrated degree of understanding via: Teach Back  Barriers to learning/adherence to lifestyle change: None Identified  Next Steps Supervised Weight Loss (SWL) Visits Needed: 0  Patient is to call [facility] to enroll in Pre-Op Class (>2 weeks before surgery) and Post-Op Class (2 weeks after surgery) for further nutrition education when surgery date is scheduled.

## 2018-05-12 NOTE — Patient Instructions (Addendum)
Begin working through the [employer] discussed today. Choose 1 or 2 goals to start with this week, and incorporate another 1 or 2 every couple of weeks.   Make sure you find a variety of protein shake flavors/brands that you like.   See you at Pre-Op Class!

## 2018-05-13 ENCOUNTER — Encounter: Payer: Self-pay | Admitting: Gastroenterology

## 2018-05-13 DIAGNOSIS — G4733 Obstructive sleep apnea (adult) (pediatric): Secondary | ICD-10-CM | POA: Diagnosis not present

## 2018-05-16 ENCOUNTER — Other Ambulatory Visit: Payer: Self-pay | Admitting: Family Medicine

## 2018-05-16 DIAGNOSIS — N200 Calculus of kidney: Secondary | ICD-10-CM

## 2018-05-25 ENCOUNTER — Ambulatory Visit: Payer: Self-pay | Admitting: Nurse Practitioner

## 2018-05-26 ENCOUNTER — Ambulatory Visit (HOSPITAL_COMMUNITY): Payer: BLUE CROSS/BLUE SHIELD

## 2018-05-26 ENCOUNTER — Ambulatory Visit: Payer: BLUE CROSS/BLUE SHIELD | Admitting: Family Medicine

## 2018-05-27 DIAGNOSIS — F509 Eating disorder, unspecified: Secondary | ICD-10-CM | POA: Diagnosis not present

## 2018-05-29 ENCOUNTER — Encounter: Payer: Self-pay | Admitting: Family Medicine

## 2018-05-30 DIAGNOSIS — G4733 Obstructive sleep apnea (adult) (pediatric): Secondary | ICD-10-CM | POA: Insufficient documentation

## 2018-05-30 DIAGNOSIS — Z9989 Dependence on other enabling machines and devices: Secondary | ICD-10-CM

## 2018-05-30 NOTE — Progress Notes (Deleted)
PATIENT: Donald Gilbert DOB: 1955/10/30  REASON FOR VISIT: follow up HISTORY FROM: patient  No chief complaint on file.    HISTORY OF PRESENT ILLNESS: Today 05/30/18 Donald Gilbert is a 63 y.o. male here today for follow up for OSA on CPAP.     HISTORY: (copied from Dr Teofilo Pod note on 10/20/2017) Dear Dr. Earlene Plater,   I saw your patient, Oldrich Nilo, upon your kind request in my neurologic clinic today for initial consultation of his sleep disorder in particular, reevaluation of his prior diagnosis of OSA. The patient is accompanied by his wife today. As you know, Mr. Strub is a 32 year old right-handed gentleman with an underlying medical history of vitamin D deficiency, hypertension, hyperlipidemia, recurrent kidney stones, insulin resistance, b/l lower extremity edema, arthritis, eczema, remote history of smoking and morbid obesity with BMI of 40, who was previously diagnosed with obstructive sleep apnea and placed on CPAP therapy. Prior sleep study results are not available for my review today. We will request old sleep study records. He had a sleep specialist in [place] but has not seen him in over one year. CPAP supplies were coming from [place]. A CPAP download was reviewed today from 09/20/2017 through 10/19/2017 which is a total of 30 days, during which time he used his CPAP 29 days with percent used days greater than 4 hours at 76.7%, indicating adequate compliance with an average usage of 4 hours and 41 minutes, residual AHI at goal at 0.7 per hour, leak low, CPAP pressure of 10 cm. I reviewed your office note from 08/02/2017. Using a Mirage FX nasal mask with success. He typically does not sleep very long. He averages about 4-5 hours of sleep he reports. Bedtime is generally around 8:30 or 9, rise time around 2 AM. He does not have night to night nocturia or recurrent morning headaches. He does not report any family history of OSA. He had UPPP after his original sleep apnea  diagnosis. His sleep study was about 6 or 7 years ago. He moved from [place] back to [place] in October 2018 to be closer to his daughter and now 66-month-old granddaughter. He has another daughter in [place]. He quit smoking in 1991 and drinks alcohol occasionally, once or twice per week, caffeine none regular basis, came off of soda. He lives with his wife. They help take care of the grandbaby. He would like to get new supplies. 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 Weight has fluctuated a little bit but generally has remained fairly stable.  REVIEW OF SYSTEMS: Out of a complete 14 system review of symptoms, the patient complains only of the following symptoms, and all other reviewed systems are negative.  ALLERGIES: No Known Allergies  HOME MEDICATIONS: Outpatient Medications Prior to Visit  Medication Sig Dispense Refill   allopurinol (ZYLOPRIM) 300 MG tablet TAKE 1 TABLET(300 MG) BY MOUTH DAILY 90 tablet 2   aspirin EC 81 MG tablet Take 1 tablet (81 mg total) by mouth 2 (two) times daily. 60 tablet 6   buPROPion (WELLBUTRIN XL) 150 MG 24 hr tablet TAKE 1 TABLET(150 MG) BY MOUTH DAILY 60 tablet 3   carvedilol (COREG) 12.5 MG tablet TAKE 1 TABLET(12.5 MG) BY MOUTH TWICE DAILY 180 tablet 2   diclofenac (VOLTAREN) 75 MG EC tablet TAKE 1 TABLET(75 MG) BY MOUTH TWICE DAILY 180 tablet 0   Dulaglutide (TRULICITY) 0.75 MG/0.5ML SOPN 0.75 mg [place] q wk x 2 wks ,  then increase to 1.5 mg [place] if tolerating 4 pen 4   losartan-hydrochlorothiazide (HYZAAR) 100-25 MG tablet Take 1 tablet by mouth daily.     simvastatin (ZOCOR) 40 MG tablet TAKE 1 TABLET(40 MG) BY MOUTH DAILY 90 tablet 2   No facility-administered medications prior to visit.     PAST MEDICAL HISTORY: Past Medical History:  Diagnosis Date   Arthralgia of multiple joints 04/03/2017   Atopic dermatitis 04/03/2017   BPPV (benign paroxysmal positional vertigo)    Chickenpox    Dysthymia  04/03/2017   Hyperlipidemia    Hypertension    Insulin resistance 04/03/2017   Kidney stone    Nasal polyp    OSA on CPAP    Recurrent kidney stones 04/03/2017   Urinary hesitancy 04/03/2017   Vitamin D deficiency 04/03/2017    PAST SURGICAL HISTORY: Past Surgical History:  Procedure Laterality Date   COLONOSCOPY  03/2008   no precancerous polyps   TONSILLECTOMY     UMBILICAL HERNIA REPAIR      FAMILY HISTORY: Family History  Problem Relation Age of Onset   Hypertension Mother     SOCIAL HISTORY: Social History   Socioeconomic History   Marital status: Married    Spouse name: Not on file   Number of children: Not on file   Years of education: Not on file   Highest education level: Not on file  Occupational History    Employer: Scientist, 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 on file   Food insecurity:    Worry: Not on file    Inability: Not on file   Transportation needs:    Medical: Not on file    Non-medical: Not on file  Tobacco Use   Smoking status: Former Smoker    Last attempt to quit: 11/1989    Years since quitting: 28.5   Smokeless tobacco: Former Neurosurgeon    Types: Chew    Quit date: 2001  Substance and Sexual Activity   Alcohol use: Yes    Comment: OCC    Drug use: No   Sexual activity: Yes  Lifestyle   Physical activity:    Days per week: Not on file    Minutes per session: Not on file   Stress: Not on file  Relationships   Social connections:    Talks on phone: Not on file    Gets together: Not on file    Attends religious service: Not on file    Active member of club or organization: Not on file    Attends meetings of clubs or organizations: Not on file    Relationship status: Not on file   Intimate partner violence:    Fear of current or ex partner: Not on file    Emotionally abused: Not on file    Physically abused: Not on file    Forced sexual activity: Not on file  Other Topics  Concern   Not on file  Social History Narrative   Not on file      PHYSICAL EXAM  There were no vitals filed for this visit. There is no height or weight on file to calculate BMI.  Generalized: Well developed, in no acute distress  Cardiology: normal rate and rhythm, no murmur noted Neurological examination  Mentation: Alert oriented to time, place, history taking. Follows all commands speech and language fluent Cranial nerve II-XII: Pupils were equal round reactive to light. Extraocular movements were full, visual field were full on  confrontational test. Facial sensation and strength were normal. Uvula tongue midline. Head turning and shoulder shrug  were normal and symmetric. Motor: The motor testing reveals 5 over 5 strength of all 4 extremities. Good symmetric motor tone is noted throughout.  Sensory: Sensory testing is intact to soft touch on all 4 extremities. No evidence of extinction is noted.  Coordination: Cerebellar testing reveals good finger-nose-finger and heel-to-shin bilaterally.  Gait and station: Gait is normal. Tandem gait is normal. Romberg is negative. No drift is seen.  Reflexes: Deep tendon reflexes are symmetric and normal bilaterally.   DIAGNOSTIC DATA (LABS, IMAGING, TESTING) - I reviewed patient records, labs, notes, testing and imaging myself where available.  No flowsheet data found.   Lab Results  Component Value Date   WBC 6.8 04/04/2017   HGB 15.0 04/04/2017   HCT 43.7 04/04/2017   MCV 86.6 04/04/2017   PLT 149.0 (L) 04/04/2017      Component Value Date/Time   NA 138 02/24/2018 1608   K 4.0 02/24/2018 1608   CL 99 02/24/2018 1608   CO2 30 02/24/2018 1608   GLUCOSE 101 (H) 02/24/2018 1608   BUN 23 02/24/2018 1608   BUN 16 11/10/2016   CREATININE 1.19 02/24/2018 1608   CALCIUM 9.7 02/24/2018 1608   PROT 6.8 02/24/2018 1608   ALBUMIN 4.2 04/04/2017 1448   AST 20 02/24/2018 1608   ALT 39 02/24/2018 1608   ALKPHOS 51 04/04/2017 1448    BILITOT 0.6 02/24/2018 1608   Lab Results  Component Value Date   CHOL 140 04/04/2017   HDL 31.80 (L) 04/04/2017   LDLCALC 77 04/04/2017   TRIG 154.0 (H) 04/04/2017   CHOLHDL 4 04/04/2017   Lab Results  Component Value Date   HGBA1C 5.8 (H) 02/24/2018   Lab Results  Component Value Date   VITAMINB12 451 04/04/2017   Lab Results  Component Value Date   TSH 2.11 04/04/2017       ASSESSMENT AND PLAN 63 y.o. year old male  has a past medical history of Arthralgia of multiple joints (04/03/2017), Atopic dermatitis (04/03/2017), BPPV (benign paroxysmal positional vertigo), Chickenpox, Dysthymia (04/03/2017), Hyperlipidemia, Hypertension, Insulin resistance (04/03/2017), Kidney stone, Nasal polyp, OSA on CPAP, Recurrent kidney stones (04/03/2017), Urinary hesitancy (04/03/2017), and Vitamin D deficiency (04/03/2017). here with ***    ICD-10-CM   1. OSA on CPAP G47.33    Z99.89        No orders of the defined types were placed in this encounter.    No orders of the defined types were placed in this encounter.     I spent 15 minutes with the patient. 50% of this time was spent counseling and educating patient on plan of care and medications.    Shawnie Dapper, FNP-C 05/30/2018, 8:16 AM [facility] [address] [place], [place] [address] [phone]

## 2018-05-31 ENCOUNTER — Telehealth: Payer: Self-pay | Admitting: *Deleted

## 2018-05-31 NOTE — Telephone Encounter (Signed)
Covid-19 travel screening questions  Have you traveled in the last 14 days? No , not out of [place]  If yes where?  Do you now or have you had a fever in the last 14 days?  No   Do you have any respiratory symptoms of shortness of breath or cough now or in the last 14 days? No   Do you have a medical history of Congestive Heart Failure? No   Do you have a medical history of lung disease?  No  , uses CPAP   Do you have any family members or close contacts with diagnosed or suspected Covid-19?    No

## 2018-06-01 ENCOUNTER — Encounter: Payer: Self-pay | Admitting: Family Medicine

## 2018-06-01 ENCOUNTER — Telehealth: Payer: Self-pay | Admitting: Family Medicine

## 2018-06-01 ENCOUNTER — Telehealth: Payer: Self-pay | Admitting: Gastroenterology

## 2018-06-01 ENCOUNTER — Other Ambulatory Visit: Payer: Self-pay

## 2018-06-01 ENCOUNTER — Telehealth: Payer: Self-pay | Admitting: *Deleted

## 2018-06-01 ENCOUNTER — Ambulatory Visit (AMBULATORY_SURGERY_CENTER): Payer: Self-pay | Admitting: *Deleted

## 2018-06-01 ENCOUNTER — Encounter: Payer: Self-pay | Admitting: *Deleted

## 2018-06-01 ENCOUNTER — Ambulatory Visit: Payer: BLUE CROSS/BLUE SHIELD | Admitting: Family Medicine

## 2018-06-01 VITALS — Temp 98.3°F | Ht 70.0 in | Wt 282.0 lb

## 2018-06-01 DIAGNOSIS — Z1211 Encounter for screening for malignant neoplasm of colon: Secondary | ICD-10-CM

## 2018-06-01 DIAGNOSIS — G4733 Obstructive sleep apnea (adult) (pediatric): Secondary | ICD-10-CM

## 2018-06-01 DIAGNOSIS — T884XXA Failed or difficult intubation, initial encounter: Secondary | ICD-10-CM

## 2018-06-01 DIAGNOSIS — Z9989 Dependence on other enabling machines and devices: Principal | ICD-10-CM

## 2018-06-01 MED ORDER — PEG-KCL-NACL-NASULF-NA ASC-C 140 G PO SOLR: 1.0000 | ORAL | 0 refills | Status: DC

## 2018-06-01 NOTE — Telephone Encounter (Signed)
PATIENT: Donald Gilbert DOB: 09/01/1955  REASON FOR VISIT: follow up HISTORY FROM: patient  CC: OSA on CPAP    HISTORY OF PRESENT ILLNESS: Today 05/30/18 Donald Gilbert is a 63 y.o. male here today for follow up for OSA on CPAP.  Patient reports compliance with nightly use and feels that CPAP is very beneficial.  Download report dated 04/30/2018 through 05/29/2018 reveals that he is using CPAP 30 out of 30 days for compliance of 100%.  He is using CPAP greater than 4 hours 30 out of 30 days for compliance 100%.  Average usage was 7 hours and 22 minutes.  AHI was 0.6 on 11 cm of water and EPR of 3.  There was no significant leak.  He is tolerating CPAP well.  He is in need of new supplies.    HISTORY: (copied from Dr Teofilo Pod note on 10/20/2017) Dear Dr. Earlene Plater,   I saw your patient, Donald Gilbert, upon your kind request in my neurologic clinic today for initial consultation of his sleep disorder in particular, reevaluation of his prior diagnosis of OSA. The patient is accompanied by his wife today. As you know, Mr. Wasil is a 63 year old right-handed gentleman with an underlying medical history of vitamin D deficiency, hypertension, hyperlipidemia, recurrent kidney stones, insulin resistance, b/l lower extremity edema, arthritis, eczema, remote history of smoking and morbid obesity with BMI of 40, who was previously diagnosed with obstructive sleep apnea and placed on CPAP therapy. Prior sleep study results are not available for my review today. We will request old sleep study records. He had a sleep specialist in [place] but has not seen him in over one year. CPAP supplies were coming from [place]. A CPAP download was reviewed today from 09/20/2017 through 10/19/2017 which is a total of 30 days, during which time he used his CPAP 29 days with percent used days greater than 4 hours at 76.7%, indicating adequate compliance with an average usage of 4 hours and 41 minutes, residual AHI at goal at 0.7  per hour, leak low, CPAP pressure of 10 cm. I reviewed your office note from 08/02/2017. Using a Mirage FX nasal mask with success. He typically does not sleep very long. He averages about 4-5 hours of sleep he reports. Bedtime is generally around 8:30 or 9, rise time around 2 AM. He does not have night to night nocturia or recurrent morning headaches. He does not report any family history of OSA. He had UPPP after his original sleep apnea diagnosis. His sleep study was about 6 or 7 years ago. He moved from [place] back to [place] in October 2018 to be closer to his daughter and now 40-month-old granddaughter. He has another daughter in [place]. He quit smoking in 1991 and drinks alcohol occasionally, once or twice per week, caffeine none regular basis, came off of soda. He lives with his wife. They help take care of the grandbaby. He would like to get new supplies. 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 Weight has fluctuated a little bit but generally has remained fairly stable.  REVIEW OF SYSTEMS: Out of a complete 14 system review of symptoms, the patient complains only of the following symptoms, and all other reviewed systems are negative.  ALLERGIES: No Known Allergies  HOME MEDICATIONS: Outpatient Medications Prior to Visit  Medication Sig Dispense Refill   allopurinol (ZYLOPRIM) 300 MG tablet TAKE 1 TABLET(300 MG) BY MOUTH DAILY 90 tablet 2  aspirin EC 81 MG tablet Take 1 tablet (81 mg total) by mouth 2 (two) times daily. 60 tablet 6   buPROPion (WELLBUTRIN XL) 150 MG 24 hr tablet TAKE 1 TABLET(150 MG) BY MOUTH DAILY 60 tablet 3   carvedilol (COREG) 12.5 MG tablet TAKE 1 TABLET(12.5 MG) BY MOUTH TWICE DAILY 180 tablet 2   diclofenac (VOLTAREN) 75 MG EC tablet TAKE 1 TABLET(75 MG) BY MOUTH TWICE DAILY 180 tablet 0   Dulaglutide (TRULICITY) 0.75 MG/0.5ML SOPN 0.75 mg [place] q wk x 2 wks , then increase to 1.5 mg [place] if tolerating 4 pen 4    losartan-hydrochlorothiazide (HYZAAR) 100-25 MG tablet Take 1 tablet by mouth daily.     simvastatin (ZOCOR) 40 MG tablet TAKE 1 TABLET(40 MG) BY MOUTH DAILY 90 tablet 2   No facility-administered medications prior to visit.     PAST MEDICAL HISTORY: Past Medical History:  Diagnosis Date   Arthralgia of multiple joints 04/03/2017   Atopic dermatitis 04/03/2017   BPPV (benign paroxysmal positional vertigo)    Chickenpox    Dysthymia 04/03/2017   Hyperlipidemia    Hypertension    Insulin resistance 04/03/2017   Kidney stone    Nasal polyp    OSA on CPAP    Recurrent kidney stones 04/03/2017   Urinary hesitancy 04/03/2017   Vitamin D deficiency 04/03/2017    PAST SURGICAL HISTORY: Past Surgical History:  Procedure Laterality Date   COLONOSCOPY  03/2008   no precancerous polyps   TONSILLECTOMY     UMBILICAL HERNIA REPAIR      FAMILY HISTORY: Family History  Problem Relation Age of Onset   Hypertension Mother     SOCIAL HISTORY: Social History   Socioeconomic History   Marital status: Married    Spouse name: Not on file   Number of children: Not on file   Years of education: Not on file   Highest education level: Not on file  Occupational History    Employer: Scientist, 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 on file   Food insecurity:    Worry: Not on file    Inability: Not on file   Transportation needs:    Medical: Not on file    Non-medical: Not on file  Tobacco Use   Smoking status: Former Smoker    Last attempt to quit: 11/1989    Years since quitting: 28.5   Smokeless tobacco: Former Neurosurgeon    Types: Chew    Quit date: 2001  Substance and Sexual Activity   Alcohol use: Yes    Comment: OCC    Drug use: No   Sexual activity: Yes  Lifestyle   Physical activity:    Days per week: Not on file    Minutes per session: Not on file   Stress: Not on file  Relationships   Social connections:    Talks  on phone: Not on file    Gets together: Not on file    Attends religious service: Not on file    Active member of club or organization: Not on file    Attends meetings of clubs or organizations: Not on file    Relationship status: Not on file   Intimate partner violence:    Fear of current or ex partner: Not on file    Emotionally abused: Not on file    Physically abused: Not on file    Forced sexual activity: Not on file  Other Topics Concern  Not on file  Social History Narrative   Not on file      PHYSICAL EXAM Not performed due to being telephone encounter (caronavirus)  DIAGNOSTIC DATA (LABS, IMAGING, TESTING) - I reviewed patient records, labs, notes, testing and imaging myself where available.  No flowsheet data found.   Lab Results  Component Value Date   WBC 6.8 04/04/2017   HGB 15.0 04/04/2017   HCT 43.7 04/04/2017   MCV 86.6 04/04/2017   PLT 149.0 (L) 04/04/2017      Component Value Date/Time   NA 138 02/24/2018 1608   K 4.0 02/24/2018 1608   CL 99 02/24/2018 1608   CO2 30 02/24/2018 1608   GLUCOSE 101 (H) 02/24/2018 1608   BUN 23 02/24/2018 1608   BUN 16 11/10/2016   CREATININE 1.19 02/24/2018 1608   CALCIUM 9.7 02/24/2018 1608   PROT 6.8 02/24/2018 1608   ALBUMIN 4.2 04/04/2017 1448   AST 20 02/24/2018 1608   ALT 39 02/24/2018 1608   ALKPHOS 51 04/04/2017 1448   BILITOT 0.6 02/24/2018 1608   Lab Results  Component Value Date   CHOL 140 04/04/2017   HDL 31.80 (L) 04/04/2017   LDLCALC 77 04/04/2017   TRIG 154.0 (H) 04/04/2017   CHOLHDL 4 04/04/2017   Lab Results  Component Value Date   HGBA1C 5.8 (H) 02/24/2018   Lab Results  Component Value Date   VITAMINB12 451 04/04/2017   Lab Results  Component Value Date   TSH 2.11 04/04/2017    ASSESSMENT AND PLAN 63 y.o. year old male  has a past medical history of Arthralgia of multiple joints (04/03/2017), Atopic dermatitis (04/03/2017), BPPV (benign paroxysmal positional vertigo),  Chickenpox, Dysthymia (04/03/2017), Hyperlipidemia, Hypertension, Insulin resistance (04/03/2017), Kidney stone, Nasal polyp, OSA on CPAP, Recurrent kidney stones (04/03/2017), Urinary hesitancy (04/03/2017), and Vitamin D deficiency (04/03/2017). here with     ICD-10-CM   1. OSA on CPAP G47.33    Z99.89     Mr. Dicola is doing very well with CPAP usage.  Download report reveals excellent compliance.  We will continue at current settings.  We will place order for supplies today.  He was advised to follow-up with, sooner if needed.  Mr Dowell consented to a telephone consult today.  I have spent a total of 15 minutes discussing download report, diagnosis and plan with patient.  Orders Placed This Encounter  Procedures   For home use only DME continuous positive airway pressure (CPAP)    Needs supplies    Order Specific Question:   Patient has OSA or probable OSA    Answer:   Yes    Order Specific Question:   Settings    Answer:   Other see comments    Order Specific Question:   CPAP supplies needed    Answer:   Mask, headgear, cushions, filters, heated tubing and water chamber     I spent 15 minutes with the patient. 50% of this time was spent counseling and educating patient on plan of care and medications.    Shawnie Dapper, FNP-C 05/30/2018, 8:16 AM [facility] [address] [place], [place] [address] [phone]

## 2018-06-01 NOTE — Telephone Encounter (Signed)
Pt and Wife called back and wife states she was given a paper stating he was a difficult intubation from the anesthesiologist it was a blue paper  -- from the hernia surgery- said he has a thick neck-  Hard to intubate    With the new information, will forward to Dr [name] about needing OV vs Direct hospital case.    Dr [name], please advise if he needs an OV or if he can be direct at hospital   Thanks so very much, Hilda Lias PV

## 2018-06-01 NOTE — Telephone Encounter (Signed)
See other phone note

## 2018-06-01 NOTE — Progress Notes (Signed)
No egg or soy allergy known to patient  No issues with past sedation with any surgeries  or procedures,  intubation problems With an umbilical hernia - he satets told afterwards due to sev soret ht he was hard to intubate but did not receive a diff intubation latter to carry- TE to [place] to clarify [facility] or [facility]  No diet pills per patient No home 02 use per patient  No blood thinners per patient  Pt states issues with constipation - he has issues maybe twice a week- has a bm every 2-3 days, never daily hard - will do a 2 day Plenvu prep  No A fib or A flutter  EMMI video sent to pt's e mail - declined  Plenvu universal coupon to pt.

## 2018-06-01 NOTE — Progress Notes (Signed)
Donald Gilbert is a 63 y.o. male is here for follow up.  History of Present Illness:   [name], CMA acting as scribe for Dr. Briscoe Deutscher.   HPI: Patient in office for Pre op EKG and CXR. He is in the process of getting Bariatric surgery.  No concerns. Has completed RD and Psych evaluations. Needs colonoscopy but will have to wait until June -July. Difficult intubation, so needs to be completed in hospital.   Health Maintenance Due  Topic Date Due  . COLONOSCOPY  04/10/2018   Depression screen [facility] 2/9 05/12/2018 04/01/2017  Decreased Interest 0 2  Down, Depressed, Hopeless 0 0  PHQ - 2 Score 0 2  Altered sleeping - 0  Tired, decreased energy - 0  Change in appetite - 0  Feeling bad or failure about yourself  - 3  Trouble concentrating - 0  Moving slowly or fidgety/restless - 0  Suicidal thoughts - 0  PHQ-9 Score - 5   PMHx, SurgHx, SocialHx, FamHx, Medications, and Allergies were reviewed in the Visit Navigator and updated as appropriate.   Patient Active Problem List   Diagnosis Date Noted  . Difficult intubation   . OSA on CPAP 05/30/2018  . Vitamin D deficiency 04/03/2017  . Obesity 04/03/2017  . Essential hypertension 04/03/2017  . Mixed hyperlipidemia 04/03/2017  . Recurrent kidney stones 04/03/2017  . Insulin resistance 04/03/2017  . Arthralgia of multiple joints 04/03/2017  . Atopic dermatitis 04/03/2017  . Urinary hesitancy 04/03/2017  . Dysthymia 04/03/2017   Social History   Tobacco Use  . Smoking status: Former Smoker    Last attempt to quit: 11/1989    Years since quitting: 28.5  . Smokeless tobacco: Former Systems developer    Types: Chew    Quit date: 2001  Substance Use Topics  . Alcohol use: Yes    Comment: OCC   . Drug use: No   Current Medications and Allergies   .  allopurinol (ZYLOPRIM) 300 MG tablet, TAKE 1 TABLET(300 MG) BY MOUTH DAILY, Disp: 90 tablet, Rfl: 2 .  aspirin EC 81 MG tablet, Take 1 tablet (81 mg total) by mouth 2 (two)  times daily., Disp: 60 tablet, Rfl: 6 .  bisacodyl (DULCOLAX) 5 MG EC tablet, Take 5 mg by mouth once. X 4 colon prep 2 day, Disp: , Rfl:  .  buPROPion (WELLBUTRIN XL) 150 MG 24 hr tablet, TAKE 1 TABLET(150 MG) BY MOUTH DAILY, Disp: 60 tablet, Rfl: 3 .  carvedilol (COREG) 12.5 MG tablet, TAKE 1 TABLET(12.5 MG) BY MOUTH TWICE DAILY, Disp: 180 tablet, Rfl: 2 .  diclofenac (VOLTAREN) 75 MG EC tablet, TAKE 1 TABLET(75 MG) BY MOUTH TWICE DAILY, Disp: 180 tablet, Rfl: 0 .  Dulaglutide (TRULICITY) 5.53 ZS/8.2LM SOPN, 0.75 mg [place] q wk x 2 wks , then increase to 1.5 mg [place] if tolerating, Disp: 4 pen, Rfl: 4 .  losartan-hydrochlorothiazide (HYZAAR) 100-25 MG tablet, Take 1 tablet by mouth daily., Disp: , Rfl:  .  mupirocin ointment (BACTROBAN) 2 %, APPLY TO AFFECTED AREA BID, Disp: , Rfl:  .  PEG-KCl-NaCl-NaSulf-Na Asc-C (PLENVU) 140 g SOLR, Take 1 kit by mouth as directed., Disp: 1 each, Rfl: 0 .  polyethylene glycol powder (MIRALAX) powder, Take 1 Container by mouth once. 119 grams for colon 2 day prep, Disp: , Rfl:  .  simvastatin (ZOCOR) 40 MG tablet, TAKE 1 TABLET(40 MG) BY MOUTH DAILY, Disp: 90 tablet, Rfl: 2  No Known Allergies   Review of  Systems   Pertinent items are noted in the HPI. Otherwise, a complete ROS is negative.  Vitals   Vitals:   06/02/18 0725  BP: 130/72  Pulse: 62  Temp: 98.6 F (37 C)  TempSrc: Oral  SpO2: 97%  Weight: 275 lb (124.7 kg)  Height: _0  (1.778 m)     Body mass index is 39.46 kg/m.  Physical Exam   Physical Exam Vitals signs and nursing note reviewed.  Constitutional:      General: He is not in acute distress.    Appearance: He is well-developed.  HENT:     Head: Normocephalic and atraumatic.     Right Ear: External ear normal.     Left Ear: External ear normal.     Nose: Nose normal.  Eyes:     Conjunctiva/sclera: Conjunctivae normal.     Pupils: Pupils are equal, round, and reactive to light.  Neck:     Musculoskeletal: Neck supple.    Cardiovascular:     Rate and Rhythm: Normal rate and regular rhythm.  Pulmonary:     Effort: Pulmonary effort is normal.  Abdominal:     General: Bowel sounds are normal.     Palpations: Abdomen is soft.  Musculoskeletal: Normal range of motion.  Skin:    General: Skin is warm.  Neurological:     Mental Status: He is alert.  Psychiatric:        Behavior: Behavior normal.    Results for orders placed or performed in visit on 02/24/18  Hemoglobin A1c  Result Value Ref Range   Hgb A1c MFr Bld 5.8 (H) <5.7 % of total Hgb   Mean Plasma Glucose 120 (calc)   eAG (mmol/L) 6.6 (calc)  Comp Met (CMET)  Result Value Ref Range   Glucose, Bld 101 (H) 65 - 99 mg/dL   BUN 23 7 - 25 mg/dL   Creat 1.19 0.70 - 1.33 mg/dL   BUN/Creatinine Ratio NOT APPLICABLE 6 - 22 (calc)   Sodium 138 135 - 146 mmol/L   Potassium 4.0 3.5 - 5.3 mmol/L   Chloride 99 98 - 110 mmol/L   CO2 30 20 - 32 mmol/L   Calcium 9.7 8.6 - 10.3 mg/dL   Total Protein 6.8 6.1 - 8.1 g/dL   Albumin 4.3 3.6 - 5.1 g/dL   Globulin 2.5 1.9 - 3.7 g/dL (calc)   AG Ratio 1.7 1.0 - 2.5 (calc)   Total Bilirubin 0.6 0.2 - 1.2 mg/dL   Alkaline phosphatase (APISO) 74 40 - 115 U/L   AST 20 10 - 35 U/L   ALT 39 9 - 46 U/L   EKG: normal sinus rhythm.   Dg Chest 2 View  Result Date: 06/02/2018 CLINICAL DATA:  Preop, hypertension EXAM: CHEST - 2 VIEW COMPARISON:  None. FINDINGS: The heart size and mediastinal contours are within normal limits. Both lungs are clear. 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 IMPRESSION: No acute abnormality of the lungs.  No focal airspace opacity. Electronically Signed   By: Eddie Candle M.D.   On: 06/02/2018 08:15   Assessment and Plan   [name] was seen today for follow-up.  Diagnoses and all orders for this visit:  Pre-op testing -     EKG 12-Lead -     DG Chest 2 View; Future  Morbid obesity ([place])  Essential hypertension  Mixed hyperlipidemia  OSA on  CPAP   . Orders and follow up as documented in [place], reviewed diet, exercise  and weight control, cardiovascular risk and specific lipid/LDL goals reviewed, reviewed medications and side effects in detail.  . Reviewed expectations re: course of current medical issues. . Outlined signs and symptoms indicating need for more acute intervention. . Patient verbalized understanding and all questions were answered. . Patient received an After Visit Summary.  CMA served as Education administrator during this visit. History, Physical, and Plan performed by medical provider. The above documentation has been reviewed and is accurate and complete. Briscoe Deutscher, D.O.  Briscoe Deutscher, DO [place], [facility] 06/02/2018

## 2018-06-01 NOTE — Telephone Encounter (Signed)
John,  I saw this pt in [facility] today- he explained to me during his umbilical hernia surgery, when  he woke up he had a very sore throat and was told when he questioned this that they had a hard time intubating him- he was not given a paper due to difficult intubation- has had a ulvula removed,  He was never told he had a small air way but was told they had issues placing the airway.  Last colon in [place] 10 yrs ago with MAC , no issues.    Please advise,  Lelan Pons

## 2018-06-01 NOTE — Telephone Encounter (Signed)
Pt called in stating he had some additional questions for the nurse

## 2018-06-01 NOTE — Telephone Encounter (Signed)
When this patient's colonoscopy is performed, it should be done in the hospital outpatient endoscopy department.  However, we are currently cancelling and not scheduling any new routine procedures due to COVID-19.  As such, this patient should be put in for June 2020 colonoscopy recall.

## 2018-06-01 NOTE — Telephone Encounter (Signed)
Cancelled 07-07-2018 colon-  Called pt and wife- Gave them information per Dr [name]- Explained hospital case in  08-2018 due to not scheduling screenings due to COVID 19--- recall placed for 08-2018 [id number]-- both pt and wife verbalized understanding that we will call back in June and [facility] for [facility] case --  Pt asked what he can use OTC for his constipation- explained any stool softener 1-2 times a day or OTC miralx 1 capful daily . Both verbalized understanding

## 2018-06-01 NOTE — Telephone Encounter (Signed)
I reviewed the above note and documentation by the Nurse Practitioner and agree with the history, phone assessment and plan as outlined above. I was immediately available for a  consultation. Huston Foley, MD, PhD [facility])

## 2018-06-02 ENCOUNTER — Ambulatory Visit (INDEPENDENT_AMBULATORY_CARE_PROVIDER_SITE_OTHER): Payer: BLUE CROSS/BLUE SHIELD | Admitting: Family Medicine

## 2018-06-02 ENCOUNTER — Ambulatory Visit (HOSPITAL_COMMUNITY): Payer: BLUE CROSS/BLUE SHIELD

## 2018-06-02 ENCOUNTER — Ambulatory Visit (INDEPENDENT_AMBULATORY_CARE_PROVIDER_SITE_OTHER): Payer: BLUE CROSS/BLUE SHIELD

## 2018-06-02 ENCOUNTER — Encounter: Payer: Self-pay | Admitting: Family Medicine

## 2018-06-02 VITALS — BP 130/72 | HR 62 | Temp 98.6°F | Ht 70.0 in | Wt 275.0 lb

## 2018-06-02 DIAGNOSIS — E782 Mixed hyperlipidemia: Secondary | ICD-10-CM

## 2018-06-02 DIAGNOSIS — Z01818 Encounter for other preprocedural examination: Secondary | ICD-10-CM

## 2018-06-02 DIAGNOSIS — G4733 Obstructive sleep apnea (adult) (pediatric): Secondary | ICD-10-CM | POA: Diagnosis not present

## 2018-06-02 DIAGNOSIS — I10 Essential (primary) hypertension: Secondary | ICD-10-CM

## 2018-06-02 DIAGNOSIS — Z9989 Dependence on other enabling machines and devices: Secondary | ICD-10-CM

## 2018-06-08 ENCOUNTER — Encounter: Payer: BLUE CROSS/BLUE SHIELD | Admitting: Gastroenterology

## 2018-06-12 ENCOUNTER — Telehealth: Payer: Self-pay

## 2018-06-12 ENCOUNTER — Other Ambulatory Visit: Payer: Self-pay | Admitting: Family Medicine

## 2018-06-12 DIAGNOSIS — M2550 Pain in unspecified joint: Secondary | ICD-10-CM

## 2018-06-12 NOTE — Telephone Encounter (Signed)
Unable to get in contact with the patient to discuss a cpap appointment per Shawnie Dapper, NP. I left a voicemail for the patient to call me back. Office number was provided.

## 2018-06-13 ENCOUNTER — Encounter: Payer: Self-pay | Admitting: Family Medicine

## 2018-06-13 LAB — VITAMIN D2,D3 PANEL
Vitamin D2 1, 25 (OH)2: <8
Vitamin D3 1, 25 (OH)2: 11

## 2018-06-30 ENCOUNTER — Ambulatory Visit (HOSPITAL_COMMUNITY): Payer: BLUE CROSS/BLUE SHIELD

## 2018-06-30 ENCOUNTER — Encounter (HOSPITAL_COMMUNITY): Payer: Self-pay

## 2018-07-07 ENCOUNTER — Encounter: Payer: BLUE CROSS/BLUE SHIELD | Admitting: Gastroenterology

## 2018-07-12 ENCOUNTER — Other Ambulatory Visit: Payer: Self-pay | Admitting: Family Medicine

## 2018-07-28 ENCOUNTER — Telehealth: Payer: Self-pay | Admitting: Gastroenterology

## 2018-07-28 NOTE — Telephone Encounter (Signed)
Donald Gilbert I am not sure when [name]' next hospital slot is, I am off next week so I sent this to you if there is any upcoming availability.

## 2018-07-31 DIAGNOSIS — G4733 Obstructive sleep apnea (adult) (pediatric): Secondary | ICD-10-CM | POA: Diagnosis not present

## 2018-07-31 NOTE — Telephone Encounter (Signed)
Left message to return call 

## 2018-07-31 NOTE — Telephone Encounter (Signed)
Screening procedure. Please do not put him on the 6/15 schedule yet.  I do not yet know how many slots I will have on that date, and we have several patients requiring diagnostic procedures.    He will remain on our list of patients needing hospital procedures.  Should know in next couple of weeks if can be June or if must wait until July.

## 2018-07-31 NOTE — Telephone Encounter (Signed)
Looks like from the schedule the next hospital date is on 6-15. Do you want to go ahead and put him on the schedule?

## 2018-08-01 NOTE — Telephone Encounter (Signed)
Pt has been notified and aware.  

## 2018-08-22 ENCOUNTER — Encounter: Payer: Self-pay | Admitting: Gastroenterology

## 2018-08-23 NOTE — Telephone Encounter (Signed)
Patient calling to knowif he can reschedule his procedure

## 2018-08-25 NOTE — Telephone Encounter (Signed)
Pt is aware we are waiting on the July schedule to open for a procedure date.

## 2018-08-26 DIAGNOSIS — F509 Eating disorder, unspecified: Secondary | ICD-10-CM | POA: Diagnosis not present

## 2018-09-01 ENCOUNTER — Ambulatory Visit: Payer: BLUE CROSS/BLUE SHIELD | Admitting: Family Medicine

## 2018-09-08 ENCOUNTER — Ambulatory Visit: Payer: BLUE CROSS/BLUE SHIELD | Admitting: Family Medicine

## 2018-09-10 ENCOUNTER — Other Ambulatory Visit: Payer: Self-pay | Admitting: Family Medicine

## 2018-09-10 DIAGNOSIS — M2550 Pain in unspecified joint: Secondary | ICD-10-CM

## 2018-09-10 DIAGNOSIS — E782 Mixed hyperlipidemia: Secondary | ICD-10-CM

## 2018-09-12 ENCOUNTER — Other Ambulatory Visit: Payer: Self-pay

## 2018-09-12 DIAGNOSIS — T884XXA Failed or difficult intubation, initial encounter: Secondary | ICD-10-CM

## 2018-09-12 DIAGNOSIS — Z1211 Encounter for screening for malignant neoplasm of colon: Secondary | ICD-10-CM

## 2018-09-12 MED ORDER — NA SULFATE-K SULFATE-MG SULF 17.5-3.13-1.6 GM/177ML PO SOLN: 1.0000 | Freq: Once | ORAL | 0 refills | Status: AC

## 2018-09-18 ENCOUNTER — Other Ambulatory Visit: Payer: Self-pay

## 2018-09-18 ENCOUNTER — Telehealth: Payer: Self-pay | Admitting: Gastroenterology

## 2018-09-18 DIAGNOSIS — R1310 Dysphagia, unspecified: Secondary | ICD-10-CM

## 2018-09-18 DIAGNOSIS — Z01818 Encounter for other preprocedural examination: Secondary | ICD-10-CM

## 2018-09-18 NOTE — Telephone Encounter (Signed)
Pt aware to proceed with the prep he has at home

## 2018-09-18 NOTE — Progress Notes (Signed)
Orders placed for referral for UGI for pre-operative clearance/dysphagia.

## 2018-09-27 ENCOUNTER — Other Ambulatory Visit: Payer: Self-pay | Admitting: Family Medicine

## 2018-09-27 ENCOUNTER — Ambulatory Visit
Admission: RE | Admit: 2018-09-27 | Discharge: 2018-09-27 | Disposition: A | Discharge status: Home / Self Care | Payer: BC Managed Care – PPO | Source: Ambulatory Visit | Attending: Family Medicine | Admitting: Family Medicine

## 2018-09-27 DIAGNOSIS — R1310 Dysphagia, unspecified: Secondary | ICD-10-CM

## 2018-09-27 DIAGNOSIS — Z01818 Encounter for other preprocedural examination: Secondary | ICD-10-CM | POA: Diagnosis not present

## 2018-09-27 DIAGNOSIS — K224 Dyskinesia of esophagus: Secondary | ICD-10-CM | POA: Diagnosis not present

## 2018-09-28 ENCOUNTER — Other Ambulatory Visit (HOSPITAL_COMMUNITY)
Admission: RE | Admit: 2018-09-28 | Discharge: 2018-09-28 | Disposition: A | Discharge status: Home / Self Care | Payer: BC Managed Care – PPO | Source: Ambulatory Visit | Attending: Gastroenterology | Admitting: Gastroenterology

## 2018-09-28 DIAGNOSIS — Z1159 Encounter for screening for other viral diseases: Secondary | ICD-10-CM | POA: Insufficient documentation

## 2018-09-28 LAB — SARS CORONAVIRUS 2 (TAT 6-24 HRS): SARS Coronavirus 2: NEGATIVE

## 2018-09-29 NOTE — Progress Notes (Addendum)
Spoke with patient about Endoscopy procedure on Monday 7/20 and ensured patient has remained quarantined since COVID test, and that he has not been around anyone sick nor has had symptoms of being sick. All questions addressed.

## 2018-10-02 ENCOUNTER — Encounter (HOSPITAL_COMMUNITY): Payer: Self-pay | Admitting: Registered Nurse

## 2018-10-02 ENCOUNTER — Ambulatory Visit (HOSPITAL_COMMUNITY): Payer: BC Managed Care – PPO | Admitting: Registered Nurse

## 2018-10-02 ENCOUNTER — Ambulatory Visit (HOSPITAL_COMMUNITY)
Admission: RE | Admit: 2018-10-02 | Discharge: 2018-10-02 | Disposition: A | Discharge status: Home / Self Care | Payer: BC Managed Care – PPO | Attending: Gastroenterology | Admitting: Gastroenterology

## 2018-10-02 ENCOUNTER — Encounter (HOSPITAL_COMMUNITY)
Admission: RE | Disposition: A | Discharge status: Home / Self Care | Payer: Self-pay | Source: Home / Self Care | Attending: Gastroenterology

## 2018-10-02 ENCOUNTER — Other Ambulatory Visit: Payer: Self-pay

## 2018-10-02 DIAGNOSIS — M1990 Unspecified osteoarthritis, unspecified site: Secondary | ICD-10-CM | POA: Insufficient documentation

## 2018-10-02 DIAGNOSIS — K5730 Diverticulosis of large intestine without perforation or abscess without bleeding: Secondary | ICD-10-CM | POA: Insufficient documentation

## 2018-10-02 DIAGNOSIS — K640 First degree hemorrhoids: Secondary | ICD-10-CM | POA: Diagnosis not present

## 2018-10-02 DIAGNOSIS — G4733 Obstructive sleep apnea (adult) (pediatric): Secondary | ICD-10-CM | POA: Diagnosis not present

## 2018-10-02 DIAGNOSIS — T884XXA Failed or difficult intubation, initial encounter: Secondary | ICD-10-CM

## 2018-10-02 DIAGNOSIS — I10 Essential (primary) hypertension: Secondary | ICD-10-CM | POA: Diagnosis not present

## 2018-10-02 DIAGNOSIS — Z1211 Encounter for screening for malignant neoplasm of colon: Secondary | ICD-10-CM | POA: Diagnosis not present

## 2018-10-02 DIAGNOSIS — Z6839 Body mass index (BMI) 39.0-39.9, adult: Secondary | ICD-10-CM | POA: Insufficient documentation

## 2018-10-02 DIAGNOSIS — Z87891 Personal history of nicotine dependence: Secondary | ICD-10-CM | POA: Diagnosis not present

## 2018-10-02 HISTORY — PX: COLONOSCOPY WITH PROPOFOL: SHX5780

## 2018-10-02 LAB — GLUCOSE, CAPILLARY
Glucose-Capillary: 106 mg/dL — ABNORMAL HIGH (ref 70–99)
Glucose-Capillary: 105 mg/dL — ABNORMAL HIGH (ref 70–99)

## 2018-10-02 SURGERY — COLONOSCOPY WITH PROPOFOL
Anesthesia: Monitor Anesthesia Care

## 2018-10-02 MED ORDER — PROPOFOL 10 MG/ML IV BOLUS
INTRAVENOUS | Status: AC
  Filled 2018-10-02: qty 40

## 2018-10-02 MED ORDER — LIDOCAINE HCL (CARDIAC) PF 100 MG/5ML IV SOSY
PREFILLED_SYRINGE | INTRAVENOUS | Status: DC | PRN
  Administered 2018-10-02: 100 mg via INTRAVENOUS

## 2018-10-02 MED ORDER — PROPOFOL 10 MG/ML IV BOLUS
INTRAVENOUS | Status: AC
  Filled 2018-10-02: qty 20

## 2018-10-02 MED ORDER — SODIUM CHLORIDE 0.9 % IV SOLN: INTRAVENOUS | Status: DC

## 2018-10-02 MED ORDER — PROPOFOL 500 MG/50ML IV EMUL
INTRAVENOUS | Status: DC | PRN
  Administered 2018-10-02: 225 ug/kg/min via INTRAVENOUS

## 2018-10-02 MED ORDER — LACTATED RINGERS IV SOLN
INTRAVENOUS | Status: DC | PRN
  Administered 2018-10-02: 10:00:00 via INTRAVENOUS

## 2018-10-02 MED ORDER — LACTATED RINGERS IV SOLN
INTRAVENOUS | Status: DC
  Administered 2018-10-02: 1000 mL via INTRAVENOUS

## 2018-10-02 SURGICAL SUPPLY — 22 items

## 2018-10-02 NOTE — Anesthesia Procedure Notes (Signed)
Procedure Name: MAC Date/Time: 10/02/2018 10:02 AM Performed by: Lissa Morales, CRNA Pre-anesthesia Checklist: Patient identified, Emergency Drugs available, Suction available, Patient being monitored and Timeout performed Patient Re-evaluated:Patient Re-evaluated prior to induction Oxygen Delivery Method: Simple face mask Placement Confirmation: positive ETCO2

## 2018-10-02 NOTE — Interval H&P Note (Signed)
History and Physical Interval Note:  10/02/2018 9:45 AM  Donald Gilbert  has presented today for surgery, with the diagnosis of screening colonoscopy tt.  The various methods of treatment have been discussed with the patient and family. After consideration of risks, benefits and other options for treatment, the patient has consented to  Procedure(s): COLONOSCOPY WITH PROPOFOL (N/A) as a surgical intervention.  The patient's history has been reviewed, patient examined, no change in status, stable for surgery.  I have reviewed the patient's chart and labs.  Questions were answered to the patient's satisfaction.     Nelida Meuse III

## 2018-10-02 NOTE — Anesthesia Preprocedure Evaluation (Addendum)
Anesthesia Evaluation  Patient identified by MRN, date of birth, ID band Patient awake    Reviewed: Allergy & Precautions, NPO status , Patient's Chart, lab work & pertinent test results, reviewed documented beta blocker date and time   History of Anesthesia Complications (+) DIFFICULT AIRWAY and history of anesthetic complications  Airway Mallampati: III  TM Distance: >3 FB Neck ROM: Full   Comment:  Thick neck  Dental  (+) Dental Advisory Given, Teeth Intact   Pulmonary sleep apnea and Continuous Positive Airway Pressure Ventilation , former smoker,    breath sounds clear to auscultation       Cardiovascular hypertension, Pt. on medications and Pt. on home beta blockers  Rhythm:Regular Rate:Normal     Neuro/Psych PSYCHIATRIC DISORDERS Depression  BPPV     GI/Hepatic negative GI ROS, Neg liver ROS,   Endo/Other  Morbid obesity  Renal/GU  Renal stones      Musculoskeletal  (+) Arthritis ,   Abdominal   Peds  Hematology negative hematology ROS (+)   Anesthesia Other Findings   Reproductive/Obstetrics                            Anesthesia Physical Anesthesia Plan  ASA: III  Anesthesia Plan: MAC   Post-op Pain Management:    Induction: Intravenous  PONV Risk Score and Plan: 1 and Propofol infusion and Treatment may vary due to age or medical condition  Airway Management Planned: Nasal Cannula and Natural Airway  Additional Equipment: None  Intra-op Plan:   Post-operative Plan:   Informed Consent: I have reviewed the patients History and Physical, chart, labs and discussed the procedure including the risks, benefits and alternatives for the proposed anesthesia with the patient or authorized representative who has indicated his/her understanding and acceptance.       Plan Discussed with: CRNA and Anesthesiologist  Anesthesia Plan Comments:        Anesthesia Quick  Evaluation

## 2018-10-02 NOTE — Op Note (Signed)
[facility] Patient Name: Donald Gilbert Procedure Date: 10/02/2018 MRN: [id number] Attending MD: Estill Cotta. Loletha Carrow , MD Date of Birth: 1955/08/23 CSN: [id number] Age: 63 Admit Type: Outpatient Procedure:                Colonoscopy Indications:              Screening for colorectal malignant neoplasm (no                            adenomatous polyps 2010) Providers:                Estill Cotta. Loletha Carrow, MD, Vista Lawman, RN, Cherylynn Ridges, Technician, Enrigue Catena, CRNA Referring MD:             Briscoe Deutscher, DO Medicines:                Monitored Anesthesia Care Complications:            No immediate complications. Estimated Blood Loss:     Estimated blood loss: none. Procedure:                Pre-Anesthesia Assessment:                           - Prior to the procedure, a History and Physical                            was performed, and patient medications and                            allergies were reviewed. The patient's tolerance of                            previous anesthesia was also reviewed. The risks                            and benefits of the procedure and the sedation                            options and risks were discussed with the patient.                            All questions were answered, and informed consent                            was obtained. Prior Anticoagulants: The patient has                            taken no previous anticoagulant or antiplatelet                            agents except for aspirin. ASA Grade Assessment:  III - A patient with severe systemic disease. After                            reviewing the risks and benefits, the patient was                            deemed in satisfactory condition to undergo the                            procedure.                           After obtaining informed consent, the colonoscope                            was passed under  direct vision. Throughout the                            procedure, the patient's blood pressure, pulse, and                            oxygen saturations were monitored continuously. The                            CF-HQ190L ([id number]) Olympus colonoscope was                            introduced through the anus and advanced to the the                            cecum, identified by appendiceal orifice and                            ileocecal valve. The colonoscopy was somewhat                            difficult due to significant looping and the                            patient's body habitus. Successful completion of                            the procedure was aided by using manual pressure.                            The patient tolerated the procedure well. The                            quality of the bowel preparation was good except                            for a small amount of food debris that could not be  cleared from the AO. The ileocecal valve,                            appendiceal orifice, and rectum were photographed. Scope In: 10:09:31 AM Scope Out: 10:29:01 AM Scope Withdrawal Time: 0 hours 10 minutes 37 seconds  Total Procedure Duration: 0 hours 19 minutes 30 seconds  Findings:      The perianal and digital rectal examinations were normal.      Diverticula were found in the left colon.      Internal hemorrhoids were found. The hemorrhoids were Grade I (internal       hemorrhoids that do not prolapse).      The exam was otherwise without abnormality on direct and retroflexion       views. Impression:               - Diverticulosis in the left colon.                           - Internal hemorrhoids.                           - The examination was otherwise normal on direct                            and retroflexion views.                           - No specimens collected. Moderate Sedation:      MAC sedation used Recommendation:            - Patient has a contact number available for                            emergencies. The signs and symptoms of potential                            delayed complications were discussed with the                            patient. Return to normal activities tomorrow.                            Written discharge instructions were provided to the                            patient.                           - Resume previous diet.                           - Continue present medications.                           - Repeat colonoscopy in 10 years for screening                            purposes.  Procedure Code(s):        --- Professional ---                           [phone], Colonoscopy, flexible; diagnostic, including                            collection of specimen(s) by brushing or washing,                            when performed (separate procedure) Diagnosis Code(s):        --- Professional ---                           Z12.11, Encounter for screening for malignant                            neoplasm of colon                           K64.0, First degree hemorrhoids                           K57.30, Diverticulosis of large intestine without                            perforation or abscess without bleeding CPT copyright 2019 American Medical Association. All rights reserved. The codes documented in this report are preliminary and upon coder review may  be revised to meet current compliance requirements. Sadie Hazelett L. Loletha Carrow, MD 10/02/2018 10:34:06 AM This report has been signed electronically. Number of Addenda: 0

## 2018-10-02 NOTE — Discharge Instructions (Signed)
YOU HAD AN ENDOSCOPIC PROCEDURE TODAY: Refer to the procedure report and other information in the discharge instructions given to you for any specific questions about what was found during the examination. If this information does not answer your questions, please call [place] office at [phone] to clarify.  ° °YOU SHOULD EXPECT: Some feelings of bloating in the abdomen. Passage of more gas than usual. Walking can help get rid of the air that was put into your GI tract during the procedure and reduce the bloating. If you had a lower endoscopy (such as a colonoscopy or flexible sigmoidoscopy) you may notice spotting of blood in your stool or on the toilet paper. Some abdominal soreness may be present for a day or two, also. ° °DIET: Your first meal following the procedure should be a light meal and then it is ok to progress to your normal diet. A half-sandwich or bowl of soup is an example of a good first meal. Heavy or fried foods are harder to digest and may make you feel nauseous or bloated. Drink plenty of fluids but you should avoid alcoholic beverages for 24 hours. If you had a esophageal dilation, please see attached instructions for diet.   ° °ACTIVITY: Your care partner should take you home directly after the procedure. You should plan to take it easy, moving slowly for the rest of the day. You can resume normal activity the day after the procedure however YOU SHOULD NOT DRIVE, use power tools, machinery or perform tasks that involve climbing or major physical exertion for 24 hours (because of the sedation medicines used during the test).  ° °SYMPTOMS TO REPORT IMMEDIATELY: °A gastroenterologist can be reached at any hour. Please call [phone]  for any of the following symptoms:  °Following lower endoscopy (colonoscopy, flexible sigmoidoscopy) °Excessive amounts of blood in the stool  °Significant tenderness, worsening of abdominal pains  °Swelling of the abdomen that is new, acute  °Fever of 100° or  higher  °Following upper endoscopy (EGD, EUS, ERCP, esophageal dilation) °Vomiting of blood or coffee ground material  °New, significant abdominal pain  °New, significant chest pain or pain under the shoulder blades  °Painful or persistently difficult swallowing  °New shortness of breath  °Black, tarry-looking or red, bloody stools ° °FOLLOW UP:  °If any biopsies were taken you will be contacted by phone or by letter within the next 1-3 weeks. Call [phone]  if you have not heard about the biopsies in 3 weeks.  °Please also call with any specific questions about appointments or follow up tests. ° °

## 2018-10-02 NOTE — H&P (Signed)
History:  This patient presents for endoscopic testing for colon cancer screening (history of difficult airway - hospital case).  Vella Kohler Referring physician: Briscoe Deutscher, DO  Past Medical History: Past Medical History:  Diagnosis Date  . Allergy    mild   . Arthralgia of multiple joints 04/03/2017  . Arthritis   . Atopic dermatitis 04/03/2017  . BPPV (benign paroxysmal positional vertigo)   . Chickenpox   . Constipation    maybe twice a week due to job   . Difficult intubation   . Dysthymia 04/03/2017  . Hyperlipidemia   . Hypertension   . Insulin resistance 04/03/2017  . Kidney stone   . Nasal polyp   . OSA on CPAP   . Recurrent kidney stones 04/03/2017  . Sleep apnea    wears cpap   . Urinary hesitancy 04/03/2017  . Vitamin D deficiency 04/03/2017     Past Surgical History: Past Surgical History:  Procedure Laterality Date  . COLONOSCOPY  03/2008   no precancerous polyps  . LASIK    . TONSILLECTOMY    . UMBILICAL HERNIA REPAIR    . uvula removed       Allergies: No Known Allergies  Outpatient Meds: No current facility-administered medications for this encounter.       ___________________________________________________________________ Objective   Exam:  BP 140/87   Pulse 69   Temp 98.7 F (37.1 C) (Oral)   Resp 16   Ht 5\' 10"  (1.778 m)   Wt 124.7 kg   SpO2 97%   BMI 39.46 kg/m    CV: RRR without murmur, S1/S2, no JVD, no peripheral edema  Resp: clear to auscultation bilaterally, normal RR and effort noted  GI: soft, no tenderness, with active bowel sounds. No guarding or palpable organomegaly noted.  Neuro: awake, alert and oriented x 3. Normal gross motor function and fluent speech   Assessment:  Average risk for colon cancer  Plan:  Screening colonoscopy   Nelida Meuse III

## 2018-10-02 NOTE — Anesthesia Postprocedure Evaluation (Signed)
Anesthesia Post Note  Patient: Donald Gilbert  Procedure(s) Performed: COLONOSCOPY WITH PROPOFOL (N/A )     Patient location during evaluation: PACU Anesthesia Type: MAC Level of consciousness: awake and alert Pain management: pain level controlled Vital Signs Assessment: post-procedure vital signs reviewed and stable Respiratory status: spontaneous breathing, nonlabored ventilation and respiratory function stable Cardiovascular status: stable and blood pressure returned to baseline Anesthetic complications: no    Last Vitals:  Vitals:   10/02/18 1050 10/02/18 1100  BP: 135/62 (!) 150/66  Pulse: 62 88  Resp: 20 20  Temp:    SpO2:      Last Pain:  Vitals:   10/02/18 1100  TempSrc:   PainSc: 0-No pain                 Audry Pili

## 2018-10-02 NOTE — Transfer of Care (Signed)
Immediate Anesthesia Transfer of Care Note  Patient: Donald Gilbert  Procedure(s) Performed: COLONOSCOPY WITH PROPOFOL (N/A )  Patient Location: PACU  Anesthesia Type:MAC  Level of Consciousness: awake, alert , oriented and patient cooperative  Airway & Oxygen Therapy: Patient Spontanous Breathing and Patient connected to face mask oxygen  Post-op Assessment: Report given to RN, Post -op Vital signs reviewed and stable and Patient moving all extremities X 4  Post vital signs: stable  Last Vitals:  Vitals Value Taken Time  BP    Temp 36.5 C 10/02/18 1037  Pulse 76 10/02/18 1037  Resp 20 10/02/18 1037  SpO2      Last Pain:  Vitals:   10/02/18 1037  TempSrc: Oral  PainSc:          Complications: No apparent anesthesia complications

## 2018-10-03 ENCOUNTER — Other Ambulatory Visit: Payer: Self-pay | Admitting: Family Medicine

## 2018-10-04 ENCOUNTER — Encounter (HOSPITAL_COMMUNITY): Payer: Self-pay | Admitting: Gastroenterology

## 2018-11-09 ENCOUNTER — Other Ambulatory Visit: Payer: Self-pay | Admitting: Family Medicine

## 2018-11-10 ENCOUNTER — Other Ambulatory Visit: Payer: Self-pay

## 2018-11-10 MED ORDER — BUPROPION HCL ER (XL) 150 MG PO TB24: ORAL_TABLET | ORAL | 0 refills | Status: DC

## 2018-11-10 NOTE — Telephone Encounter (Signed)
Last fill 03/14/18  #60/3 Last OV  06/02/18 Pt need a follow up before any other refills.  

## 2018-11-10 NOTE — Telephone Encounter (Signed)
Last fill 03/14/18  #60/3 Last OV  06/02/18 Pt need a follow up before any other refills.

## 2018-11-11 DIAGNOSIS — G4733 Obstructive sleep apnea (adult) (pediatric): Secondary | ICD-10-CM | POA: Diagnosis not present

## 2018-11-18 ENCOUNTER — Encounter: Payer: Self-pay | Admitting: Family Medicine

## 2018-11-24 ENCOUNTER — Ambulatory Visit (INDEPENDENT_AMBULATORY_CARE_PROVIDER_SITE_OTHER): Payer: BC Managed Care – PPO | Admitting: Family Medicine

## 2018-11-24 ENCOUNTER — Other Ambulatory Visit: Payer: Self-pay

## 2018-11-24 DIAGNOSIS — S61211A Laceration without foreign body of left index finger without damage to nail, initial encounter: Secondary | ICD-10-CM

## 2018-11-24 NOTE — Progress Notes (Signed)
   Chief Complaint:  Donald Gilbert is a 63 y.o. male who presents for same day appointment with a chief complaint of finger laceration.   Assessment/Plan:  Finger Laceration Repaired today.  See below procedure note.  Tolerated well.  Discussed care after.  Can use over-the-counter analgesics as needed.  Discussed warning signs for infection.  He will remove sutures at home in 5 to 7 days.     Subjective:  HPI:  Finger Laceration Patient was trimming bushes a few hours ago when he struck his left index finger with a machete.  Immediately noticed pain and bleeding.  Was able to the bleeding stopped with pressure.  Up-to-date on tetanus vaccine.  Able to move his finger fully.  No numbness or weakness.  ROS: Per HPI  PMH: He reports that he quit smoking about 29 years ago. He quit smokeless tobacco use about 19 years ago.  His smokeless tobacco use included chew. He reports current alcohol use. He reports that he does not use drugs.      Objective:  Physical Exam: Skin: Approximately 1.5 cm laceration to dorsoradial aspect of proximal phalanx on second digit on left hand. MSK: Fingers with full range of motion.  Sensation light touch intact throughout.  Laceration repair procedure note: Verbal consent was obtained.  Patient's laceration described above was copiously irrigated with sterile saline.  Pressure was applied until hemostasis was achieved.  Area was prepped with Betadine in usual fashion.  Approximately 1.5 cc of 2% lidocaine were infiltrated into his laceration.  After verbal adequate anesthesia, a total of 3 simple interrupted sutures were placed using 4-0 Prolene.  Area was then bandaged with antibiotic ointment and a sterile bandage.  Patient tolerated well.  Estimated 3 cc blood loss.      Algis Greenhouse. Jerline Pain, MD 11/24/2018 4:52 PM

## 2018-11-24 NOTE — Patient Instructions (Addendum)
Laceration Care, Adult A laceration is a cut that may go through all layers of the skin. The cut may also go into the tissue that is right under the skin. Some cuts heal on their own. Others need to be closed with stitches (sutures), staples, skin adhesive strips, or skin glue. Taking care of your injury lowers your risk of infection, helps your injury to heal better, and may prevent scarring. Supplies needed:  Soap.  Water.  Hand sanitizer.  Bandage (dressing).  Antibiotic ointment.  Clean towel. How to take care of your cut Wash your hands with soap and water before touching your wound or changing your bandage. If soap and water are not available, use hand sanitizer. If your doctor used stitches or staples:  Keep the wound clean and dry.  If you were given a bandage, change it at least once a day as told by your doctor. You should also change it if it gets wet or dirty.  Keep the wound completely dry for the first 24 hours, or as told by your doctor. After that, you may take a shower or a bath. Do not get the wound soaked in water until after the stitches or staples have been removed.  Clean the wound once a day, or as told by your doctor: ? Wash the wound with soap and water. ? Rinse the wound with water to remove all soap. ? Pat the wound dry with a clean towel. Do not rub the wound.  After you clean the wound, put a thin layer of antibiotic ointment on it as told by your doctor. This ointment: ? Helps to prevent infection. ? Keeps the bandage from sticking to the wound.  Have your stitches or staples removed as told by your doctor. If your doctor used skin adhesive strips:  Keep the wound clean and dry.  If you were given a bandage, you should change it at least once a day as told by your doctor. You should also change it if it gets wet or dirty.  Do not get the skin adhesive strips wet. You can take a shower or a bath, but keep the wound dry.  If the wound gets wet,  pat it dry with a clean towel. Do not rub the wound.  Skin adhesive strips fall off on their own. You can trim the strips as the wound heals. Do not remove any strips that are still stuck to the wound. They will fall off after a while. If your doctor used skin glue:  Try to keep your wound dry, but you may briefly wet it in the shower or bath. Do not soak the wound in water, such as by swimming.  After you take a shower or a bath, gently pat the wound dry with a clean towel. Do not rub the wound.  Do not do any activities that will make you really sweaty until the skin glue has fallen off on its own.  Do not apply liquid, cream, or ointment medicine to your wound while the skin glue is still on.  If you were given a bandage, you should change it at least once a day or as told by your doctor. You should also change it if it gets dirty or wet.  If a bandage is placed over the wound, do not let the tape touch the skin glue.  Do not pick at the glue. The skin glue usually stays on for 5-10 days. Then, it falls off the skin. General   instructions   Take over-the-counter and prescription medicines only as told by your doctor.  If you were given antibiotic medicine or ointment, take or apply it as told by your doctor. Do not stop using it even if your condition improves.  Do not scratch or pick at the wound.  Check your wound every day for signs of infection. Watch for: ? Redness, swelling, or pain. ? Fluid, blood, or pus.  Raise (elevate) the injured area above the level of your heart while you are sitting or lying down.  If directed, put ice on the affected area: ? Put ice in a plastic bag. ? Place a towel between your skin and the bag. ? Leave the ice on for 20 minutes, 2-3 times a day.  Prevent scarring by covering your wound with sunscreen of at least 30 SPF whenever you are outside after your wound has healed.  Keep all follow-up visits as told by your doctor. This is important.  Get help if:  You got a tetanus shot and you have any of these problems at the injection site: ? Swelling. ? Very bad pain. ? Redness. ? Bleeding.  You have a fever.  A wound that was closed breaks open.  You notice a bad smell coming from your wound or your bandage.  You notice something coming out of the wound, such as wood or glass.  Medicine does not relieve your pain.  You have more redness, swelling, or pain at the site of your wound.  You have fluid, blood, or pus coming from your wound.  You notice a change in the color of your skin near your wound.  You need to change the bandage often because fluid, blood, or pus is coming from the wound.  You start to have a new rash.  You start to have numbness around the wound. Get help right away if:  You have very bad swelling around the wound.  Your pain suddenly gets worse and is very bad.  You notice painful lumps near the wound or anywhere on your body.  You have a red streak going away from your wound.  The wound is on your hand or foot, and: ? You cannot move a finger or toe. ? Your fingers or toes look pale or bluish. Summary  A laceration is a cut that may go through all layers of the skin. The cut may also go into the tissue right under the skin.  Some cuts heal on their own. Others need to be closed with stitches, staples, skin adhesive strips, or skin glue.  Follow your doctor's instructions for caring for your cut. Proper care of a cut lowers the risk of infection, helps the cut heal better, and prevents scarring. This information is not intended to replace advice given to you by your health care provider. Make sure you discuss any questions you have with your health care provider. Document Released: 08/18/2007 Document Revised: 04/29/2017 Document Reviewed: 03/21/2017 Elsevier Patient Education  2020 Else[employer]  

## 2018-12-04 ENCOUNTER — Other Ambulatory Visit: Payer: Self-pay

## 2018-12-04 MED ORDER — TRULICITY 1.5 MG/0.5ML ~~LOC~~ SOAJ: SUBCUTANEOUS | 1 refills | Status: DC

## 2018-12-11 ENCOUNTER — Other Ambulatory Visit: Payer: Self-pay

## 2018-12-11 DIAGNOSIS — M2550 Pain in unspecified joint: Secondary | ICD-10-CM

## 2018-12-11 MED ORDER — DICLOFENAC SODIUM 75 MG PO TBEC: DELAYED_RELEASE_TABLET | ORAL | 0 refills | Status: DC

## 2018-12-22 ENCOUNTER — Other Ambulatory Visit: Payer: Self-pay

## 2018-12-22 ENCOUNTER — Encounter: Payer: Self-pay | Admitting: Family Medicine

## 2018-12-22 ENCOUNTER — Ambulatory Visit (INDEPENDENT_AMBULATORY_CARE_PROVIDER_SITE_OTHER): Payer: BC Managed Care – PPO | Admitting: Family Medicine

## 2018-12-22 VITALS — BP 162/86 | HR 86 | Temp 98.2°F | Ht 70.0 in | Wt 286.8 lb

## 2018-12-22 DIAGNOSIS — B029 Zoster without complications: Secondary | ICD-10-CM | POA: Diagnosis not present

## 2018-12-22 MED ORDER — VALACYCLOVIR HCL 1 G PO TABS: 1000.0000 mg | ORAL_TABLET | Freq: Three times a day (TID) | ORAL | 0 refills | Status: DC

## 2018-12-22 NOTE — Progress Notes (Signed)
Patient: Donald Gilbert MRN: [id number] DOB: 07/10/1955 PCP: Briscoe Deutscher, DO     Subjective:  Chief Complaint  Patient presents with  . rash on forehead    HPI: The patient is a 63 y.o. male who presents today for rash on forehead x 1 week.  Denies pain, burning or itching. Rash started last Monday and stated off as 3 bumps. He had some pain on his head in dermatomal pattern before outbreak. No tingling/burning. Lesions have now scabbed over. No other symptoms. Vision not affected and not near his eye. Would also like his flu shot.   Review of Systems  Constitutional: Negative for chills and fever.  Skin: Positive for rash.       Rash on R side of forehead.  Denies pain/burning or itching.    Allergies Patient has No Known Allergies.  Past Medical History Patient  has a past medical history of Allergy, Arthralgia of multiple joints (04/03/2017), Arthritis, Atopic dermatitis (04/03/2017), BPPV (benign paroxysmal positional vertigo), Chickenpox, Constipation, Difficult intubation, Dysthymia (04/03/2017), Hyperlipidemia, Hypertension, Insulin resistance (04/03/2017), Kidney stone, Nasal polyp, OSA on CPAP, Recurrent kidney stones (04/03/2017), Sleep apnea, Urinary hesitancy (04/03/2017), and Vitamin D deficiency (04/03/2017).  Surgical History Patient  has a past surgical history that includes Umbilical hernia repair; Tonsillectomy; Colonoscopy (03/2008); uvula removed ; LASIK; and Colonoscopy with propofol (N/A, 10/02/2018).  Family History Pateint's family history includes Hypertension in his mother.  Social History Patient  reports that he quit smoking about 29 years ago. He quit smokeless tobacco use about 19 years ago.  His smokeless tobacco use included chew. He reports current alcohol use. He reports that he does not use drugs.    Objective: Vitals:   12/22/18 1449  BP: (!) 162/86  Pulse: 86  Temp: 98.2 F (36.8 C)  TempSrc: Skin  SpO2: 98%  Weight: 286 lb 12.8 oz  (130.1 kg)  Height: 5\' 10"  (1.778 m)    Body mass index is 41.15 kg/m.  Physical Exam Vitals signs reviewed.  Constitutional:      Appearance: Normal appearance. He is obese.  HENT:     Head: Normocephalic and atraumatic.  Eyes:     Extraocular Movements: Extraocular movements intact.     Pupils: Pupils are equal, round, and reactive to light.  Pulmonary:     Effort: Pulmonary effort is normal.  Skin:    Findings: Rash present.     Comments: 5+ scabbed over lesion on erythematous base in dermatomal pattern.   Neurological:     General: No focal deficit present.     Mental Status: He is alert and oriented to person, place, and time.        Assessment/plan: 1. Herpes zoster without complication All lesions scabbing with no new outbreaks. Conservative therapy and will go ahead and send in course of valtrex. If any eye issues he is to let me know.    Return if symptoms worsen or fail to improve.   Orma Flaming, MD [place]   12/22/2018

## 2018-12-22 NOTE — Patient Instructions (Signed)
Shingles  Shingles is an infection. It gives you a painful skin rash and blisters that have fluid in them. Shingles is caused by the same germ (virus) that causes chickenpox. Shingles only happens in people who:  Have had chickenpox.  Have been given a shot of medicine (vaccine) to protect against chickenpox. Shingles is rare in this group. The first symptoms of shingles may be itching, tingling, or pain in an area on your skin. A rash will show on your skin a few days or weeks later. The rash is likely to be on one side of your body. The rash usually has a shape like a belt or a band. Over time, the rash turns into fluid-filled blisters. The blisters will break open, change into scabs, and dry up. Medicines may:  Help with pain and itching.  Help you get better sooner.  Help to prevent long-term problems. Follow these instructions at home: Medicines  Take over-the-counter and prescription medicines only as told by your doctor.  Put on an anti-itch cream or numbing cream where you have a rash, blisters, or scabs. Do this as told by your doctor. Helping with itching and discomfort   Put cold, wet cloths (cold compresses) on the area of the rash or blisters as told by your doctor.  Cool baths can help you feel better. Try adding baking soda or dry oatmeal to the water to lessen itching. Do not bathe in hot water. Blister and rash care  Keep your rash covered with a loose bandage (dressing).  Wear loose clothing that does not rub on your rash.  Keep your rash and blisters clean. To do this, wash the area with mild soap and cool water as told by your doctor.  Check your rash every day for signs of infection. Check for: ? More redness, swelling, or pain. ? Fluid or blood. ? Warmth. ? Pus or a bad smell.  Do not scratch your rash. Do not pick at your blisters. To help you to not scratch: ? Keep your fingernails clean and cut short. ? Wear gloves or mittens when you sleep, if  scratching is a problem. General instructions  Rest as told by your doctor.  Keep all follow-up visits as told by your doctor. This is important.  Wash your hands often with soap and water. If soap and water are not available, use hand sanitizer. Doing this lowers your chance of getting a skin infection caused by germs (bacteria).  Your infection can cause chickenpox in people who have never had chickenpox or never got a shot of chickenpox vaccine. If you have blisters that did not change into scabs yet, try not to touch other people or be around other people, especially: ? Babies. ? Pregnant women. ? Children who have areas of red, itchy, or rough skin (eczema). ? Very old people who have transplants. ? People who have a long-term (chronic) sickness, like cancer or AIDS. Contact a doctor if:  Your pain does not get better with medicine.  Your pain does not get better after the rash heals.  You have any signs of infection in the rash area. These signs include: ? More redness, swelling, or pain around the rash. ? Fluid or blood coming from the rash. ? The rash area feeling warm to the touch. ? Pus or a bad smell coming from the rash. Get help right away if:  The rash is on your face or nose.  You have pain in your face or pain by   your eye.  You lose feeling on one side of your face.  You have trouble seeing.  You have ear pain, or you have ringing in your ear.  You have a loss of taste.  Your condition gets worse. Summary  Shingles gives you a painful skin rash and blisters that have fluid in them.  Shingles is an infection. It is caused by the same germ (virus) that causes chickenpox.  Keep your rash covered with a loose bandage (dressing). Wear loose clothing that does not rub on your rash.  If you have blisters that did not change into scabs yet, try not to touch other people or be around people. This information is not intended to replace advice given to you by  your health care provider. Make sure you discuss any questions you have with your health care provider. Document Released: 08/18/2007 Document Revised: 06/23/2018 Document Reviewed: 11/03/2016 Elsevier Patient Education  2020 Elsevier Inc.  

## 2018-12-25 ENCOUNTER — Encounter: Payer: Self-pay | Admitting: Dietician

## 2018-12-25 ENCOUNTER — Other Ambulatory Visit: Payer: Self-pay

## 2018-12-25 ENCOUNTER — Encounter: Payer: BC Managed Care – PPO | Attending: General Surgery | Admitting: Dietician

## 2018-12-25 DIAGNOSIS — E669 Obesity, unspecified: Secondary | ICD-10-CM | POA: Diagnosis not present

## 2018-12-25 NOTE — Progress Notes (Signed)
Pre-Operative Nutrition Class   Appt Start Time: 5:15pm     End Time: 6:55pm   Patient was seen on 12/25/2018 for Pre-Operative Bariatric Surgery Education at [facility].    Surgery date: ? Surgery type: Sleeve   Start weight at [facility]: 278.7 lbs (date: 05/12/2018) Weight today: 287.3 lbs BMI: 41.2 kg/m2   Samples Given per [facility]NT Protocol (pt educated on appropriate usage) Multivitamin: Bariatric Advantage Lot #[id number] Exp: 10/2019   Calcium: Bariatric Fusion   Protein Drink: [place] Lot #[id number] CCP 0150  Exp: 02/11/2020   The following the learning objectives were met by the patient during this course:  Identify Pre-Op Dietary Goals and will begin 2 weeks pre-operatively (*2 week "Pre-Op Diet" not applicable for this pt based on BMI)  Identify appropriate sources of fluids and proteins   State protein recommendations and appropriate sources pre and post-operatively  Identify Post-Operative Dietary Goals and will follow for 2 weeks post-operatively  Identify appropriate multivitamin and calcium sources  Describe the need for physical activity post-operatively and will follow MD recommendations  State when to call healthcare provider regarding medication questions or post-operative complications   Handouts given include:  Pre-Op Bariatric Surgery Diet Handout  Protein Shake Handout  Post-Op Bariatric Surgery Nutrition Handout  [facility] Program Information Flyer  Support Group Information Flyer  [facility] Bariatric Supplements Price List   Follow-Up Plan: Patient will follow-up at [facility] 2 weeks post operatively for diet advancement per MD.

## 2018-12-27 ENCOUNTER — Other Ambulatory Visit: Payer: Self-pay

## 2018-12-27 MED ORDER — TRULICITY 1.5 MG/0.5ML ~~LOC~~ SOAJ: SUBCUTANEOUS | 1 refills | Status: DC

## 2019-01-04 DIAGNOSIS — E559 Vitamin D deficiency, unspecified: Secondary | ICD-10-CM | POA: Diagnosis not present

## 2019-01-04 DIAGNOSIS — R7303 Prediabetes: Secondary | ICD-10-CM | POA: Diagnosis not present

## 2019-01-04 DIAGNOSIS — E781 Pure hyperglyceridemia: Secondary | ICD-10-CM | POA: Diagnosis not present

## 2019-01-05 ENCOUNTER — Ambulatory Visit: Payer: Self-pay | Admitting: General Surgery

## 2019-01-05 NOTE — H&P (View-Only) (Signed)
Donald [facility] Documented: 01/04/2019 2:42 PM Location: [facility] Patient #: [id number] DOB: Jul 28, 1955 Married / Language: Donald Gilbert / Race: White Male  History of Present Illness [name] [name] MD; 01/05/2019 11:19 AM) The patient is a 63 year old male who presents for a bariatric surgery evaluation. Comes in for long-term follow-up regarding his obesity. I initially met him in February of this year. He has completed his bariatric surgery evaluation process. He denies any medical changes since I initially met him in February other than a brief episode of some shingles on his right forehead. He denies any chest pain, chest pressure, shorts of breath, orthopnea, dyspnea on exertion, angina, TIAs or amaurosis fugax. He denies any trips the emergency room or hospital. His upper GI showed mild dysmotility without any evidence of hiatal hernia. Bariatric evaluation labs were unremarkable except for a low HDL level of 37, triglyceride level 171 and a low vitamin D at 11. He denies any tobacco use. He has received psychological clearance  05/04/2018 He is referred by Dr Juleen China for evaluation of weight loss surgery. He is accompanied by his wife. He completed our Neurosurgeon. He is interested in sleeve gastrectomy. He is interested in losing 75 pounds, improving his overall health, being more mobile and being able to keep up with his granddaughter. Despite several attempts for sustained weight loss he has been unsuccessful. He has tried [employer], [employer], and [employer]ystem along with whole 30 in the past without any long-term success  His comorbidities include hypertriglyceridemia, hypertension, prediabetes, obstructive sleep apnea on CPAP, vitamin D deficiency, osteoarthritis right hand  He denies any chest pain, chest pressure, shorts of breath, orthopnea, dyspnea on exertion, paroxysmal nocturnal dyspnea, angina. He denies any TIAs or amaurosis fugax. He may have some  trace peripheral edema. He denies any personal history of blood clots. He has hypertriglyceridemia. He uses CPAP. He denies any heartburn, reflux or indigestion. He has had a prior open umbilical hernia repair with mesh around 2014. He denies any melena or hematochezia. He does have constipation. He averages a bowel movement every other day to every third day. His last colonoscopy was in winter 2010. He had some benign polyps.  He denies any dysuria, hematuria. 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 Denies a diagnosis of RA. He has prediabetes. He denies any vision changes. He denies any severe headaches.  Denies tobacco or alcohol or drugs. He works as a Games developer.  They used to live in [place] and moved to the area a little over a year ago   Problem List/Past Medical [name] [name], MD; 01/05/2019 11:20 AM) PREDIABETES (R73.03) HYPERTRIGLYCERIDEMIA (E78.1) OTHER FATIGUE (R53.83) HYPERTENSION, ESSENTIAL, BENIGN (I10) VITAMIN D DEFICIENCY (E55.9) MORBID OBESITY (E66.01) I do think the patient is a candidate for weight loss surgery.  Past Surgical History [name] [name], MD; 01/05/2019 11:20 AM) Colon Polyp Removal - Colonoscopy  Diagnostic Studies History [name] [name], MD; 01/05/2019 11:20 AM) Colonoscopy 5-10 years ago  Allergies [name] [place], RMA; 01/04/2019 2:42 PM) No Known Allergies [05/04/2018]: No Known Drug Allergies [05/04/2018]: Allergies Reconciled  Medication History [employer], RMA; 01/04/2019 2:42 PM) Allopurinol (300MG Tablet, Oral) Active. buPROPion HCl ER (XL) (150MG Tablet ER 24HR, Oral) Active. Carvedilol (12.5MG Tablet, Oral) Active. Simvastatin (40MG Tablet, Oral) Active. Trulicity (7.6EG/3.1DV Soln Pen-inj, Subcutaneous) Active. Losartan Potassium-HCTZ (100-25MG Tablet, Oral) Active. metFORMIN HCl ER (500MG Tablet ER 24HR, Oral) Active. Diclofenac Sodium (75MG Tablet DR, Oral) Active. Medications  Reconciled  Social History [name]  [name], MD; 01/05/2019 11:20 AM) Alcohol use Occasional alcohol use. Caffeine use Tea. No drug use Tobacco use Former smoker.  Family History [name] [name], MD; 01/05/2019 11:20 AM) Arthritis Father. Hypertension Mother, Sister. Respiratory Condition Father.  Other Problems [name] [name], MD; 01/05/2019 11:20 AM) Arthritis High blood pressure Hypercholesterolemia Kidney Stone Umbilical Hernia Repair OBSTRUCTIVE SLEEP APNEA ON CPAP (G47.33)     Review of Systems [name] [name] MD; 01/05/2019 11:19 AM) General Not Present- Appetite Loss, Chills, Fatigue, Fever, Night Sweats, Weight Gain and Weight Loss. Skin Present- Dryness. Not Present- Change in Wart/Mole, Hives, Jaundice, New Lesions, Non-Healing Wounds, Rash and Ulcer. HEENT Present- Hearing Loss, Ringing in the Ears and Wears glasses/contact lenses. Not Present- Earache, Hoarseness, Nose Bleed, Oral Ulcers, Seasonal Allergies, Sinus Pain, Sore Throat, Visual Disturbances and Yellow Eyes. Respiratory Present- Snoring. Not Present- Bloody sputum, Chronic Cough, Difficulty Breathing and Wheezing. Cardiovascular Present- Swelling of Extremities. Not Present- Chest Pain, Difficulty Breathing Lying Down, Leg Cramps, Palpitations, Rapid Heart Rate and Shortness of Breath. Gastrointestinal Not Present- Abdominal Pain, Bloating, Bloody Stool, Change in Bowel Habits, Chronic diarrhea, Constipation, Difficulty Swallowing, Excessive gas, Gets full quickly at meals, Hemorrhoids, Indigestion, Nausea, Rectal Pain and Vomiting. Male Genitourinary Present- Frequency. Not Present- Blood in Urine, Change in Urinary Stream, Impotence, Nocturia, Painful Urination, Urgency and Urine Leakage. Musculoskeletal Present- Back Pain. Not Present- Joint Pain, Joint Stiffness, Muscle Pain, Muscle Weakness and Swelling of Extremities. Neurological Not Present- Decreased Memory, Fainting, Headaches,  Numbness, Seizures, Tingling, Tremor, Trouble walking and Weakness. Psychiatric Not Present- Anxiety, Bipolar, Change in Sleep Pattern, Depression, Fearful and Frequent crying. All other systems negative  Vitals [name] [place] RMA; 01/04/2019 2:43 PM) 01/04/2019 2:42 PM Weight: 279.8 lb Height: 70in Body Surface Area: 2.41 m Body Mass Index: 40.15 kg/m  Temp.: 98.13F(Temporal)  Pulse: 72 (Regular)  P.OX: 98% (Room air) BP: 162/88 (Sitting, Left Arm, Standard)        Physical Exam [name] Indy Prestwood MD; 01/05/2019 11:19 AM)  General Mental Status-Alert. General Appearance-Consistent with stated age. Hydration-Well hydrated. Voice-Normal. Note: severe obesity; central truncal  Integumentary Note: trace edema  Head and Neck Head-normocephalic, atraumatic with no lesions or palpable masses. Trachea-midline. Thyroid Gland Characteristics - normal size and consistency.  Eye Eyeball - Bilateral-Extraocular movements intact. Sclera/Conjunctiva - Bilateral-No scleral icterus.  ENMT Ears -Note:nml ext ears.  Mouth and Throat -Note:lips intact.   Chest and Lung Exam Chest and lung exam reveals -quiet, even and easy respiratory effort with no use of accessory muscles and on auscultation, normal breath sounds, no adventitious sounds and normal vocal resonance. Inspection Chest Wall - Normal. Back - normal.  Breast - Did not examine.  Cardiovascular Cardiovascular examination reveals -normal heart sounds, regular rate and rhythm with no murmurs and normal pedal pulses bilaterally.  Abdomen Inspection Inspection of the abdomen reveals - No Hernias. Skin - Scar - no surgical scars. Palpation/Percussion Palpation and Percussion of the abdomen reveal - Soft, Non Tender, No Rebound tenderness, No Rigidity (guarding) and No hepatosplenomegaly. Auscultation Auscultation of the abdomen reveals - Bowel sounds normal.  Peripheral  Vascular Upper Extremity Palpation - Pulses bilaterally normal.  Neurologic Neurologic evaluation reveals -alert and oriented x 3 with no impairment of recent or remote memory. Mental Status-Normal.  Neuropsychiatric The patient's mood and affect are described as -normal. Judgment and Insight-insight is appropriate concerning matters relevant to self.  Musculoskeletal Normal Exam - Left-Upper Extremity Strength Normal and Lower Extremity Strength Normal. Normal Exam - Right-Upper Extremity Strength Normal  and Lower Extremity Strength Normal.  Lymphatic Head & Neck  General Head & Neck Lymphatics: Bilateral - Description - Normal. Axillary - Did not examine. Femoral & Inguinal - Did not examine.    Assessment & Plan [name] Kymani Shimabukuro MD; 01/05/2019 11:20 AM)  MORBID OBESITY (E66.01) Story: I do think the patient is a candidate for weight loss surgery. Impression: The patient meets weight loss surgery criteria. I think the patient would be an acceptable candidate for Laparoscopic vertical sleeve gastrectomy.  We rediscussed LSG. He has had his preoperative education class. We reviewed the typical hospitalization as well as the typical recovery period we reviewed his preoperative workup. We discussed the typical issues that we see after surgery. I did offer to rediscussed risk and benefits of the procedure but he declined. All of his questions were asked and answered  Current Plans Pt Education - EMW_preopbariatric  VITAMIN D DEFICIENCY (E55.9) Impression: Patient is currently taking 5000 international units of vitamin D per day. I asked him to take 50,000 units once a week for 6 weeks   HYPERTRIGLYCERIDEMIA (E78.1)   PREDIABETES (R73.03)   HYPERTENSION, ESSENTIAL, BENIGN (I10)   OBSTRUCTIVE SLEEP APNEA ON CPAP (G47.33)  Leighton Ruff. [name], MD, FACS General, Bariatric, & Minimally Invasive Surgery [facility], [place]

## 2019-01-05 NOTE — H&P (Signed)
Donald Gilbert Documented: 01/04/2019 2:42 PM Location: [facility] Patient #: [id number] DOB: [birth year] Married / Language: Donald Gilbert / Race: White Male  History of Present Illness Donald Gilbert M. Donald Boening MD; 01/05/2019 11:19 AM) The patient is a 63 year old male who presents for a bariatric Gilbert evaluation. Comes in for long-term follow-up regarding his obesity. I initially met him in February of this year. He has completed his bariatric Gilbert evaluation process. He denies any medical changes since I initially met him in February other than a brief episode of some shingles on his right forehead. He denies any chest pain, chest pressure, shorts of breath, orthopnea, dyspnea on exertion, angina, TIAs or amaurosis fugax. He denies any trips the emergency room or hospital. His upper GI showed mild dysmotility without any evidence of hiatal hernia. Bariatric evaluation labs were unremarkable except for a low HDL level of 37, triglyceride level 171 and a low vitamin D at 11. He denies any tobacco use. He has received psychological clearance  05/04/2018 He is referred by Dr Donald Gilbert for evaluation of weight loss Gilbert. He is accompanied by his wife. He completed our Neurosurgeon. He is interested in sleeve gastrectomy. He is interested in losing 75 pounds, improving his overall health, being more mobile and being able to keep up with his granddaughter. Despite several attempts for sustained weight loss he has been unsuccessful. He has tried Donald Gilbert along with whole 30 in the past without any long-term success  His comorbidities include hypertriglyceridemia, hypertension, prediabetes, obstructive sleep apnea on CPAP, vitamin D deficiency, osteoarthritis right hand  He denies any chest pain, chest pressure, shorts of breath, orthopnea, dyspnea on exertion, paroxysmal nocturnal dyspnea, angina. He denies any TIAs or amaurosis fugax. He may have some  trace peripheral edema. He denies any personal history of blood clots. He has hypertriglyceridemia. He uses CPAP. He denies any heartburn, reflux or indigestion. He has had a prior open umbilical hernia repair with mesh around 2014. He denies any melena or hematochezia. He does have constipation. He averages a bowel movement every other day to every third day. His last colonoscopy was in winter 2010. He had some benign polyps.  He denies any dysuria, hematuria. 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 Denies a diagnosis of RA. He has prediabetes. He denies any vision changes. He denies any severe headaches.  Denies tobacco or alcohol or drugs. 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 Macguire Holsinger, MD; 01/05/2019 11:20 AM) PREDIABETES (R73.03) HYPERTRIGLYCERIDEMIA (E78.1) OTHER FATIGUE (R53.83) HYPERTENSION, ESSENTIAL, BENIGN (I10) VITAMIN D DEFICIENCY (E55.9) MORBID OBESITY (E66.01) I do think the patient is a candidate for weight loss Gilbert.  Past Surgical History Donald Gilbert M. [name], MD; 01/05/2019 11:20 AM) Colon Polyp Removal - Colonoscopy  Diagnostic Studies History Donald Gilbert M. [name], MD; 01/05/2019 11:20 AM) Colonoscopy 5-10 years ago  Allergies [name] [place], RMA; 01/04/2019 2:42 PM) No Known Allergies [05/04/2018]: No Known Drug Allergies [05/04/2018]: Allergies Reconciled  Medication History [employer], RMA; 01/04/2019 2:42 PM) Allopurinol (300MG Tablet, Oral) Active. buPROPion HCl ER (XL) (150MG Tablet ER 24HR, Oral) Active. Carvedilol (12.5MG Tablet, Oral) Active. Simvastatin (40MG Tablet, Oral) Active. Trulicity (1.4NW/2.9FA Soln Pen-inj, Subcutaneous) Active. Losartan Potassium-HCTZ (100-25MG Tablet, Oral) Active. metFORMIN HCl ER (500MG Tablet ER 24HR, Oral) Active. Diclofenac Sodium (75MG Tablet DR, Oral) Active. Medications  Reconciled  Social History Donald Gilbert M.  [name], MD; 01/05/2019 11:20 AM) Alcohol use Occasional alcohol use. Caffeine use Tea. No drug use Tobacco use Former smoker.  Family History Donald Gilbert M. [name], MD; 01/05/2019 11:20 AM) Arthritis Father. Hypertension Mother, Sister. Respiratory Condition Father.  Other Problems Donald Gilbert M. [name], MD; 01/05/2019 11:20 AM) Arthritis High blood pressure Hypercholesterolemia Kidney Stone Umbilical Hernia Repair OBSTRUCTIVE SLEEP APNEA ON CPAP (G47.33)     Review of Systems Donald Gilbert M. Avan Gullett MD; 01/05/2019 11:19 AM) General Not Present- Appetite Loss, Chills, Fatigue, Fever, Night Sweats, Weight Gain and Weight Loss. Skin Present- Dryness. Not Present- Change in Wart/Mole, Hives, Jaundice, New Lesions, Non-Healing Wounds, Rash and Ulcer. HEENT Present- Hearing Loss, Ringing in the Ears and Wears glasses/contact lenses. Not Present- Earache, Hoarseness, Nose Bleed, Oral Ulcers, Seasonal Allergies, Sinus Pain, Sore Throat, Visual Disturbances and Yellow Eyes. Respiratory Present- Snoring. Not Present- Bloody sputum, Chronic Cough, Difficulty Breathing and Wheezing. Cardiovascular Present- Swelling of Extremities. Not Present- Chest Pain, Difficulty Breathing Lying Down, Leg Cramps, Palpitations, Rapid Heart Rate and Shortness of Breath. Gastrointestinal Not Present- Abdominal Pain, Bloating, Bloody Stool, Change in Bowel Habits, Chronic diarrhea, Constipation, Difficulty Swallowing, Excessive gas, Gets full quickly at meals, Hemorrhoids, Indigestion, Nausea, Rectal Pain and Vomiting. Male Genitourinary Present- Frequency. Not Present- Blood in Urine, Change in Urinary Stream, Impotence, Nocturia, Painful Urination, Urgency and Urine Leakage. Musculoskeletal Present- Back Pain. Not Present- Joint Pain, Joint Stiffness, Muscle Pain, Muscle Weakness and Swelling of Extremities. Neurological Not Present- Decreased Memory, Fainting, Headaches,  Numbness, Seizures, Tingling, Tremor, Trouble walking and Weakness. Psychiatric Not Present- Anxiety, Bipolar, Change in Sleep Pattern, Depression, Fearful and Frequent crying. All other systems negative  Vitals [name] Haggett RMA; 01/04/2019 2:43 PM) 01/04/2019 2:42 PM Weight: 279.8 lb Height: 70in Body Surface Area: 2.41 m Body Mass Index: 40.15 kg/m  Temp.: 98.42F(Temporal)  Pulse: 72 (Regular)  P.OX: 98% (Room air) BP: 162/88 (Sitting, Left Arm, Standard)        Physical Exam Donald Gilbert M. Mickey Hebel MD; 01/05/2019 11:19 AM)  General Mental Status-Alert. General Appearance-Consistent with stated age. Hydration-Well hydrated. Voice-Normal. Note: severe obesity; central truncal  Integumentary Note: trace edema  Head and Neck Head-normocephalic, atraumatic with no lesions or palpable masses. Trachea-midline. Thyroid Gland Characteristics - normal size and consistency.  Eye Eyeball - Bilateral-Extraocular movements intact. Sclera/Conjunctiva - Bilateral-No scleral icterus.  ENMT Ears -Note:nml ext ears.  Mouth and Throat -Note:lips intact.   Chest and Lung Exam Chest and lung exam reveals -quiet, even and easy respiratory effort with no use of accessory muscles and on auscultation, normal breath sounds, no adventitious sounds and normal vocal resonance. Inspection Chest Wall - Normal. Back - normal.  Breast - Did not examine.  Cardiovascular Cardiovascular examination reveals -normal heart sounds, regular rate and rhythm with no murmurs and normal pedal pulses bilaterally.  Abdomen Inspection Inspection of the abdomen reveals - No Hernias. Skin - Scar - no surgical scars. Palpation/Percussion Palpation and Percussion of the abdomen reveal - Soft, Non Tender, No Rebound tenderness, No Rigidity (guarding) and No hepatosplenomegaly. Auscultation Auscultation of the abdomen reveals - Bowel sounds normal.  Peripheral  Vascular Upper Extremity Palpation - Pulses bilaterally normal.  Neurologic Neurologic evaluation reveals -alert and oriented x 3 with no impairment of recent or remote memory. Mental Status-Normal.  Neuropsychiatric The patient's mood and affect are described as -normal. Judgment and Insight-insight is appropriate concerning matters relevant to self.  Musculoskeletal Normal Exam - Left-Upper Extremity Strength Normal and Lower Extremity Strength Normal. Normal Exam - Right-Upper Extremity Strength Normal  and Lower Extremity Strength Normal.  Lymphatic Head & Neck  General Head & Neck Lymphatics: Bilateral - Description - Normal. Axillary - Did not examine. Femoral & Inguinal - Did not examine.    Assessment & Plan Donald Gilbert M. Kelsee Preslar MD; 01/05/2019 11:20 AM)  MORBID OBESITY (E66.01) Story: I do think the patient is a candidate for weight loss Gilbert. Impression: The patient meets weight loss Gilbert criteria. I think the patient would be an acceptable candidate for Laparoscopic vertical sleeve gastrectomy.  We rediscussed LSG. He has had his preoperative education class. We reviewed the typical hospitalization as well as the typical recovery period we reviewed his preoperative workup. We discussed the typical issues that we see after Gilbert. I did offer to rediscussed risk and benefits of the procedure but he declined. All of his questions were asked and answered  Current Plans Pt Education - EMW_preopbariatric  VITAMIN D DEFICIENCY (E55.9) Impression: Patient is currently taking 5000 international units of vitamin D per day. I asked him to take 50,000 units once a week for 6 weeks   HYPERTRIGLYCERIDEMIA (E78.1)   PREDIABETES (R73.03)   HYPERTENSION, ESSENTIAL, BENIGN (I10)   OBSTRUCTIVE SLEEP APNEA ON CPAP (G47.33)  Donald Ruff. [name], MD, FACS General, Bariatric, & Minimally Invasive [facility] [place], [place]

## 2019-01-11 ENCOUNTER — Other Ambulatory Visit: Payer: Self-pay

## 2019-01-11 ENCOUNTER — Telehealth: Payer: Self-pay | Admitting: Family Medicine

## 2019-01-11 DIAGNOSIS — I10 Essential (primary) hypertension: Secondary | ICD-10-CM

## 2019-01-11 MED ORDER — CARVEDILOL 12.5 MG PO TABS: 12.5000 mg | ORAL_TABLET | Freq: Two times a day (BID) | ORAL | 0 refills | Status: DC

## 2019-01-11 NOTE — Telephone Encounter (Signed)
Patient will call back to schedule he stated

## 2019-01-11 NOTE — Telephone Encounter (Signed)
°  LAST APPOINTMENT DATE: 06/02/18 with Dr. Juleen China  NEXT APPOINTMENT DATE:needs TOC appointment  MEDICATION: Carvedilol 12.5 mg  PHARMACY:[facility] [place]  **Let patient know to contact pharmacy at the end of the day to make sure medication is ready. **  ** Please notify patient to allow 48-72 hours to process**  **Encourage patient to contact the pharmacy for refills or they can request refills through [facility]** **CLINICAL FILLS OUT ALL BELOW:   LAST REFILL: 02/14/19  QTY:[id number]  REFILL DATE: 12/09/18    OTHER COMMENTS:    Okay for refill?  Please advise

## 2019-01-11 NOTE — Telephone Encounter (Signed)
Medication refill sent in one time please schedule for TOC

## 2019-01-12 DIAGNOSIS — Z20828 Contact with and (suspected) exposure to other viral communicable diseases: Secondary | ICD-10-CM | POA: Diagnosis not present

## 2019-01-18 ENCOUNTER — Other Ambulatory Visit (HOSPITAL_COMMUNITY)
Admission: RE | Admit: 2019-01-18 | Discharge: 2019-01-18 | Disposition: A | Discharge status: Home / Self Care | Payer: BC Managed Care – PPO | Source: Ambulatory Visit | Attending: General Surgery | Admitting: General Surgery

## 2019-01-18 ENCOUNTER — Encounter (HOSPITAL_COMMUNITY): Payer: Self-pay

## 2019-01-18 DIAGNOSIS — Z20828 Contact with and (suspected) exposure to other viral communicable diseases: Secondary | ICD-10-CM | POA: Diagnosis not present

## 2019-01-18 DIAGNOSIS — Z01812 Encounter for preprocedural laboratory examination: Secondary | ICD-10-CM | POA: Insufficient documentation

## 2019-01-18 NOTE — Patient Instructions (Addendum)
DUE TO COVID-19 ONLY ONE VISITOR IS ALLOWED TO COME WITH YOU AND STAY IN THE WAITING ROOM ONLY DURING PRE OP AND PROCEDURE DAY OF SURGERY. THE 1 VISITOR MAY VISIT WITH YOU AFTER SURGERY IN YOUR PRIVATE ROOM DURING VISITING HOURS ONLY!  YOU NEED TO HAVE A COVID 19 TEST ON_______ @_______ , THIS TEST MUST BE DONE BEFORE SURGERY, COME  [address], [place] , .  [facility]) ONCE YOUR COVID TEST IS COMPLETED, PLEASE BEGIN THE QUARANTINE INSTRUCTIONS AS OUTLINED IN YOUR HANDOUT.                ENDRE COUTTS  01/18/2019   Your procedure is scheduled on: 01-22-19   Report to [facility] Main  Entrance   Report to admitting at         1100 AM     Call this number if you have problems the morning of surgery [phone]    Remember: MORNING OF SURGERY DRINK:   DRINK 1 G2 drink BEFORE YOU LEAVE HOME, DRINK ALL OF THE  G2 DRINK AT ONE TIME.   NO SOLID FOOD AFTER 600 PM THE NIGHT BEFORE YOUR SURGERY.  YOU MAY DRINK CLEAR FLUIDS. THE G2 DRINK YOU DRINK BEFORE YOU LEAVE HOME WILL BE THE LAST FLUIDS YOU DRINK BEFORE SURGERY.  PAIN IS EXPECTED AFTER SURGERY AND WILL NOT BE COMPLETELY ELIMINATED. AMBULATION AND TYLENOL WILL HELP REDUCE INCISIONAL AND GAS PAIN. MOVEMENT IS KEY!  YOU ARE EXPECTED TO BE OUT OF BED WITHIN 4 HOURS OF ADMISSION TO YOUR PATIENT ROOM.  SITTING IN THE RECLINER THROUGHOUT THE DAY IS IMPORTANT FOR DRINKING FLUIDS AND MOVING GAS THROUGHOUT THE GI TRACT.  COMPRESSION STOCKINGS SHOULD BE WORN [facility] STAY UNLESS YOU ARE WALKING.   INCENTIVE SPIROMETER SHOULD BE USED EVERY HOUR WHILE AWAKE TO DECREASE POST-OPERATIVE COMPLICATIONS SUCH AS PNEUMONIA.  WHEN DISCHARGED HOME, IT IS IMPORTANT TO CONTINUE TO WALK EVERY HOUR AND USE THE INCENTIVE SPIROMETER EVERY HOUR.      CLEAR LIQUID DIET  Until 1000am    Foods Allowed                                                                     Foods Excluded  Coffee and tea,  regular and decaf                             liquids that you cannot  Plain Jell-O any favor except red or purple                                           see through such as: Fruit ices (not with fruit pulp)                                     milk, soups, orange juice  Iced Popsicles  All solid food                                   Cranberry, grape and apple juices Sports drinks like Gatorade Lightly seasoned clear broth or consume(fat free) Sugar, honey syrup  Sample Menu Breakfast                                Lunch                                     Supper Cranberry juice                    Beef broth                            Chicken broth Jell-O                                     Grape juice                           Apple juice Coffee or tea                        Jell-O                                      Popsicle                                                Coffee or tea                        Coffee or tea  _____________________________________________________________________           BRUSH YOUR TEETH MORNING OF SURGERY AND RINSE YOUR MOUTH OUT, NO CHEWING GUM CANDY OR MINTS.     Take these medicines the morning of surgery with A SIP OF WATER: zocor , coreg ,wellbutrin  DO NOT TAKE ANY DIABETIC MEDICATIONS DAY OF YOUR SURGERY                               You may not have any metal on your body including hair pins and              piercings  Do not wear jewelry,  lotions, powders or perfumes, deodorant                       Men may shave face and neck.   Do not bring valuables to the hospital. [facility] IS NOT             RESPONSIBLE   FOR VALUABLES.  Contacts, dentures or bridgework may not be worn into surgery.  Marland Kitchen  Please read over the following fact sheets you were given: _____________________________________________________________________             [facility] - Preparing for Surgery Before  surgery, you can play an important role.  Because skin is not sterile, your skin needs to be as free of germs as possible.  You can reduce the number of germs on your skin by washing with CHG (chlorahexidine gluconate) soap before surgery.  CHG is an antiseptic cleaner which kills germs and bonds with the skin to continue killing germs even after washing. Please DO NOT use if you have an allergy to CHG or antibacterial soaps.  If your skin becomes reddened/irritated stop using the CHG and inform your nurse when you arrive at Short Stay. Do not shave (including legs and underarms) for at least 48 hours prior to the first CHG shower.  You may shave your face/neck. Please follow these instructions carefully:  1.  Shower with CHG Soap the night before surgery and the  morning of Surgery.  2.  If you choose to wash your hair, wash your hair first as usual with your  normal  shampoo.  3.  After you shampoo, rinse your hair and body thoroughly to remove the  shampoo.                           4.  Use CHG as you would any other liquid soap.  You can apply chg directly  to the skin and wash                       Gently with a scrungie or clean washcloth.  5.  Apply the CHG Soap to your body ONLY FROM THE NECK DOWN.   Do not use on face/ open                           Wound or open sores. Avoid contact with eyes, ears mouth and genitals (private parts).                       Wash face,  Genitals (private parts) with your normal soap.             6.  Wash thoroughly, paying special attention to the area where your surgery  will be performed.  7.  Thoroughly rinse your body with warm water from the neck down.  8.  DO NOT shower/wash with your normal soap after using and rinsing off  the CHG Soap.                9.  Pat yourself dry with a clean towel.            10.  Wear clean pajamas.            11.  Place clean sheets on your bed the night of your first shower and do not  sleep with pets. Day of Surgery  : Do not apply any lotions/deodorants the morning of surgery.  Please wear clean clothes to the hospital/surgery center.  FAILURE TO FOLLOW THESE INSTRUCTIONS MAY RESULT IN THE CANCELLATION OF YOUR SURGERY PATIENT SIGNATURE_________________________________  NURSE SIGNATURE__________________________________  ________________________________________________________________________   Adam Phenix  An incentive spirometer is a tool that can help keep your lungs clear and active. This tool measures how well you are filling your lungs with each breath. Taking  long deep breaths may help reverse or decrease the chance of developing breathing (pulmonary) problems (especially infection) following:  A long period of time when you are unable to move or be active. BEFORE THE PROCEDURE   If the spirometer includes an indicator to show your best effort, your nurse or respiratory therapist will set it to a desired goal.  If possible, sit up straight or lean slightly forward. Try not to slouch.  Hold the incentive spirometer in an upright position. INSTRUCTIONS FOR USE  1. Sit on the edge of your bed if possible, or sit up as far as you can in bed or on a chair. 2. Hold the incentive spirometer in an upright position. 3. Breathe out normally. 4. Place the mouthpiece in your mouth and seal your lips tightly around it. 5. Breathe in slowly and as deeply as possible, raising the piston or the ball toward the top of the column. 6. Hold your breath for 3-5 seconds or for as long as possible. Allow the piston or ball to fall to the bottom of the column. 7. Remove the mouthpiece from your mouth and breathe out normally. 8. Rest for a few seconds and repeat Steps 1 through 7 at least 10 times every 1-2 hours when you are awake. Take your time and take a few normal breaths between deep breaths. 9. The spirometer may include an indicator to show your best effort. Use the indicator as a goal to work  toward during each repetition. 10. After each set of 10 deep breaths, practice coughing to be sure your lungs are clear. If you have an incision (the cut made at the time of surgery), support your incision when coughing by placing a pillow or rolled up towels firmly against it. Once you are able to get out of bed, walk around indoors and cough well. You may stop using the incentive spirometer when instructed by your caregiver.  RISKS AND COMPLICATIONS  Take your time so you do not get dizzy or light-headed.  If you are in pain, you may need to take or ask for pain medication before doing incentive spirometry. It is harder to take a deep breath if you are having pain. AFTER USE  Rest and breathe slowly and easily.  It can be helpful to keep track of a log of your progress. Your caregiver can provide you with a simple table to help with this. If you are using the spirometer at home, follow these instructions: SEEK MEDICAL CARE IF:   You are having difficultly using the spirometer.  You have trouble using the spirometer as often as instructed.  Your pain medication is not giving enough relief while using the spirometer.  You develop fever of 100.5 F (38.1 C) or higher. SEEK IMMEDIATE MEDICAL CARE IF:   You cough up bloody sputum that had not been present before.  You develop fever of 102 F (38.9 C) or greater.  You develop worsening pain at or near the incision site. MAKE SURE YOU:   Understand these instructions.  Will watch your condition.  Will get help right away if you are not doing well or get worse. Document Released: 07/12/2006 Document Revised: 05/24/2011 Document Reviewed: 09/12/2006 Exit[employer] Patient Information 2014 [employer], [place].   ________________________________________________________________________  WHAT IS A BLOOD TRANSFUSION? Blood Transfusion Information  A transfusion is the replacement of blood or some of its parts. Blood is made up of multiple  cells which provide different functions.  Red blood cells carry oxygen  and are used for blood loss replacement.  White blood cells fight against infection.  Platelets control bleeding.  Plasma helps clot blood.  Other blood products are available for specialized needs, such as hemophilia or other clotting disorders. BEFORE THE TRANSFUSION  Who gives blood for transfusions?   Healthy volunteers who are fully evaluated to make sure their blood is safe. This is blood bank blood. Transfusion therapy is the safest it has ever been in the practice of medicine. Before blood is taken from a donor, a complete history is taken to make sure that person has no history of diseases nor engages in risky social behavior (examples are intravenous drug use or sexual activity with multiple partners). The donor's travel history is screened to minimize risk of transmitting infections, such as malaria. The donated blood is tested for signs of infectious diseases, such as HIV and hepatitis. The blood is then tested to be sure it is compatible with you in order to minimize the chance of a transfusion reaction. If you or a relative donates blood, this is often done in anticipation of surgery and is not appropriate for emergency situations. It takes many days to process the donated blood. RISKS AND COMPLICATIONS Although transfusion therapy is very safe and saves many lives, the main dangers of transfusion include:   Getting an infectious disease.  Developing a transfusion reaction. This is an allergic reaction to something in the blood you were given. Every precaution is taken to prevent this. The decision to have a blood transfusion has been considered carefully by your caregiver before blood is given. Blood is not given unless the benefits outweigh the risks. AFTER THE TRANSFUSION  Right after receiving a blood transfusion, you will usually feel much better and more energetic. This is especially true if your red  blood cells have gotten low (anemic). The transfusion raises the level of the red blood cells which carry oxygen, and this usually causes an energy increase.  The nurse administering the transfusion will monitor you carefully for complications. HOME CARE INSTRUCTIONS  No special instructions are needed after a transfusion. You may find your energy is better. Speak with your caregiver about any limitations on activity for underlying diseases you may have. SEEK MEDICAL CARE IF:   Your condition is not improving after your transfusion.  You develop redness or irritation at the intravenous (IV) site. SEEK IMMEDIATE MEDICAL CARE IF:  Any of the following symptoms occur over the next 12 hours:  Shaking chills.  You have a temperature by mouth above 102 F (38.9 C), not controlled by medicine.  Chest, back, or muscle pain.  People around you feel you are not acting correctly or are confused.  Shortness of breath or difficulty breathing.  Dizziness and fainting.  You get a rash or develop hives.  You have a decrease in urine output.  Your urine turns a dark color or changes to pink, red, or brown. Any of the following symptoms occur over the next 10 days:  You have a temperature by mouth above 102 F (38.9 C), not controlled by medicine.  Shortness of breath.  Weakness after normal activity.  The white part of the eye turns yellow (jaundice).  You have a decrease in the amount of urine or are urinating less often.  Your urine turns a dark color or changes to pink, red, or brown. Document Released: 02/27/2000 Document Revised: 05/24/2011 Document Reviewed: 10/16/2007 [facility] Patient Information 2014 [place], [place].  _______________________________________________________________________

## 2019-01-19 ENCOUNTER — Encounter (HOSPITAL_COMMUNITY): Payer: Self-pay

## 2019-01-19 ENCOUNTER — Encounter (HOSPITAL_COMMUNITY)
Admission: RE | Admit: 2019-01-19 | Discharge: 2019-01-19 | Disposition: A | Discharge status: Home / Self Care | Payer: BC Managed Care – PPO | Source: Ambulatory Visit | Attending: General Surgery | Admitting: General Surgery

## 2019-01-19 ENCOUNTER — Ambulatory Visit (INDEPENDENT_AMBULATORY_CARE_PROVIDER_SITE_OTHER): Payer: BC Managed Care – PPO | Admitting: Family Medicine

## 2019-01-19 ENCOUNTER — Encounter: Payer: Self-pay | Admitting: Family Medicine

## 2019-01-19 ENCOUNTER — Other Ambulatory Visit: Payer: Self-pay

## 2019-01-19 VITALS — BP 132/85

## 2019-01-19 DIAGNOSIS — E669 Obesity, unspecified: Secondary | ICD-10-CM | POA: Diagnosis not present

## 2019-01-19 DIAGNOSIS — Z01812 Encounter for preprocedural laboratory examination: Secondary | ICD-10-CM | POA: Diagnosis not present

## 2019-01-19 DIAGNOSIS — Z79899 Other long term (current) drug therapy: Secondary | ICD-10-CM | POA: Insufficient documentation

## 2019-01-19 DIAGNOSIS — Z7982 Long term (current) use of aspirin: Secondary | ICD-10-CM | POA: Insufficient documentation

## 2019-01-19 DIAGNOSIS — E118 Type 2 diabetes mellitus with unspecified complications: Secondary | ICD-10-CM | POA: Diagnosis not present

## 2019-01-19 DIAGNOSIS — G4733 Obstructive sleep apnea (adult) (pediatric): Secondary | ICD-10-CM | POA: Insufficient documentation

## 2019-01-19 DIAGNOSIS — E785 Hyperlipidemia, unspecified: Secondary | ICD-10-CM | POA: Diagnosis not present

## 2019-01-19 DIAGNOSIS — E782 Mixed hyperlipidemia: Secondary | ICD-10-CM | POA: Diagnosis not present

## 2019-01-19 DIAGNOSIS — N200 Calculus of kidney: Secondary | ICD-10-CM

## 2019-01-19 DIAGNOSIS — Z87891 Personal history of nicotine dependence: Secondary | ICD-10-CM | POA: Diagnosis not present

## 2019-01-19 DIAGNOSIS — I10 Essential (primary) hypertension: Secondary | ICD-10-CM | POA: Diagnosis not present

## 2019-01-19 DIAGNOSIS — F341 Dysthymic disorder: Secondary | ICD-10-CM

## 2019-01-19 HISTORY — DX: Personal history of urinary calculi: Z87.442

## 2019-01-19 HISTORY — DX: Type 2 diabetes mellitus without complications: E11.9

## 2019-01-19 LAB — CBC WITH DIFFERENTIAL/PLATELET
Abs Immature Granulocytes: 0.02 10*3/uL (ref 0.00–0.07)
Basophils Absolute: 0 10*3/uL (ref 0.0–0.1)
Basophils Relative: 1 %
Eosinophils Absolute: 0.2 10*3/uL (ref 0.0–0.5)
Eosinophils Relative: 3 %
HCT: 45.3 % (ref 39.0–52.0)
Hemoglobin: 15.2 g/dL (ref 13.0–17.0)
Immature Granulocytes: 0 %
Lymphocytes Relative: 16 %
Lymphs Abs: 1.1 10*3/uL (ref 0.7–4.0)
MCH: 30.4 pg (ref 26.0–34.0)
MCHC: 33.6 g/dL (ref 30.0–36.0)
MCV: 90.6 fL (ref 80.0–100.0)
Monocytes Absolute: 0.6 10*3/uL (ref 0.1–1.0)
Monocytes Relative: 9 %
Neutro Abs: 5.1 10*3/uL (ref 1.7–7.7)
Neutrophils Relative %: 71 %
Platelets: 211 10*3/uL (ref 150–400)
RBC: 5 MIL/uL (ref 4.22–5.81)
RDW: 13.3 % (ref 11.5–15.5)
WBC: 7.2 10*3/uL (ref 4.0–10.5)
nRBC: 0 % (ref 0.0–0.2)

## 2019-01-19 LAB — COMPREHENSIVE METABOLIC PANEL
ALT: 34 U/L (ref 0–44)
AST: 24 U/L (ref 15–41)
Albumin: 4.3 g/dL (ref 3.5–5.0)
Alkaline Phosphatase: 67 U/L (ref 38–126)
Anion gap: 10 (ref 5–15)
BUN: 20 mg/dL (ref 6–20)
CO2: 29 mmol/L (ref 22–32)
Calcium: 10.5 mg/dL — ABNORMAL HIGH (ref 8.9–10.3)
Chloride: 99 mmol/L (ref 98–111)
Creatinine, Ser: 1.07 mg/dL (ref 0.61–1.24)
GFR calc Af Amer: >60 mL/min (ref >60)
GFR calc non Af Amer: >60 mL/min (ref >60)
Glucose, Bld: 99 mg/dL (ref 70–99)
Potassium: 3.6 mmol/L (ref 3.5–5.1)
Sodium: 138 mmol/L (ref 135–145)
Total Bilirubin: 0.8 mg/dL (ref 0.3–1.2)
Total Protein: 6.9 g/dL (ref 6.5–8.1)

## 2019-01-19 LAB — GLUCOSE, CAPILLARY: Glucose-Capillary: 110 mg/dL — ABNORMAL HIGH (ref 70–99)

## 2019-01-19 LAB — NOVEL CORONAVIRUS, NAA (HOSP ORDER, SEND-OUT TO REF LAB; TAT 18-24 HRS): SARS-CoV-2, NAA: NOT DETECTED

## 2019-01-19 LAB — ABO/RH: ABO/RH(D): O POS

## 2019-01-19 LAB — HEMOGLOBIN A1C
Hgb A1c MFr Bld: 5.6 % (ref 4.8–5.6)
Mean Plasma Glucose: 114.02 mg/dL

## 2019-01-19 NOTE — Progress Notes (Signed)
PCP - Briscoe Deutscher Cardiologist -   Chest x-ray -  EKG -  Stress Test -  ECHO -  Cardiac Cath -   Sleep Study -  CPAP -   Fasting Blood Sugar -  Checks Blood Sugar _____ times a day  Blood Thinner Instructions: Aspirin Instructions: Last Dose:  Anesthesia review: Difficult intubation history   Patient denies shortness of breath, fever, cough and chest pain at PAT appointment   NONE   Patient verbalized understanding of instructions that were given to them at the PAT appointment. Patient was also instructed that they will need to review over the PAT instructions again at home before surgery.

## 2019-01-19 NOTE — Assessment & Plan Note (Signed)
Stable.  Continue simvastatin 40 mg daily.  Check lipid panel with next blood draw.  He will come in in 3 months or so for CPE.

## 2019-01-19 NOTE — Assessment & Plan Note (Signed)
Stable.  Continue uric acid 300 mg daily.

## 2019-01-19 NOTE — Assessment & Plan Note (Signed)
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  We will stop Trulicity today.

## 2019-01-19 NOTE — Assessment & Plan Note (Signed)
At goal.  Continue Coreg 12.5 mg twice daily losartan 100-25 once daily.

## 2019-01-19 NOTE — Progress Notes (Signed)
Chief Complaint:  Donald Gilbert is a 63 y.o. male who presents today for a virtual office visit with a chief complaint of HTN and to transfer care.   Assessment/Plan:  Dysthymia Continue wellbutrin 150mg  daily. Patient not sure if it is doing anything. Consider stopping after he completes his bariatric surgery.   Recurrent kidney stones Stable.  Continue uric acid 300 mg daily.  Mixed hyperlipidemia Stable.  Continue simvastatin 40 mg daily.  Check lipid panel with next blood draw.  He will come in in 3 months or so for CPE.  Essential hypertension At goal.  Continue Coreg 12.5 mg twice daily losartan 100-25 once daily.  Obesity Will be undergoing sleeve gastrectomy next week.  We will stop Trulicity today.    Subjective:  HPI:  His stable, chronic medical conditions are outlined below:  #Essential hypertension - On Coreg 12.5 mg twice daily and  Losartan 100-25mg  once daily and tolerating well - ROS: No reported chest pain or shortness of breath  #Dyslipidemia -On simvastatin 40 mg daily.  Tolerating well. - ROS: No reported mylagias  # Dysthymia - On Wellbutrin 150mg  daily and tolerating well - ROS: No reported SI or HI  # History of Urate Kidney Stones - On allopurinol 300 mg daily.  # Obesity - Sees bariatric specialist - Dr - On Trulicity 1.5 mg weekly  ROS: Per HPI  PMH: He reports that he quit smoking about 29 years ago. He quit smokeless tobacco use about 19 years ago.  His smokeless tobacco use included chew. He reports previous alcohol use. He reports that he does not use drugs.      Objective/Observations  Physical Exam: Gen: NAD, resting comfortably Pulm: Normal work of breathing Neuro: Grossly normal, moves all extremities Psych: Normal affect and thought content  Results for orders placed or performed during the hospital encounter of 01/19/19 (from the past 24 hour(s))  Glucose, capillary     Status: Abnormal   Collection Time:  01/19/19  8:16 AM  Result Value Ref Range   Glucose-Capillary 110 (H) 70 - 99 mg/dL  CBC WITH DIFFERENTIAL     Status: None   Collection Time: 01/19/19  9:05 AM  Result Value Ref Range   WBC 7.2 4.0 - 10.5 K/uL   RBC 5.00 4.22 - 5.81 MIL/uL   Hemoglobin 15.2 13.0 - 17.0 g/dL   HCT 13/06/20 13/06/20 - 06.2 %   MCV 90.6 80.0 - 100.0 fL   MCH 30.4 26.0 - 34.0 pg   MCHC 33.6 30.0 - 36.0 g/dL   RDW 37.6 28.3 - 15.1 %   Platelets 211 150 - 400 K/uL   nRBC 0.0 0.0 - 0.2 %   Neutrophils Relative % 71 %   Neutro Abs 5.1 1.7 - 7.7 K/uL   Lymphocytes Relative 16 %   Lymphs Abs 1.1 0.7 - 4.0 K/uL   Monocytes Relative 9 %   Monocytes Absolute 0.6 0.1 - 1.0 K/uL   Eosinophils Relative 3 %   Eosinophils Absolute 0.2 0.0 - 0.5 K/uL   Basophils Relative 1 %   Basophils Absolute 0.0 0.0 - 0.1 K/uL   Immature Granulocytes 0 %   Abs Immature Granulocytes 0.02 0.00 - 0.07 K/uL  Comprehensive metabolic panel     Status: Abnormal   Collection Time: 01/19/19  9:05 AM  Result Value Ref Range   Sodium 138 135 - 145 mmol/L   Potassium 3.6 3.5 - 5.1 mmol/L   Chloride 99 98 -  111 mmol/L   CO2 29 22 - 32 mmol/L   Glucose, Bld 99 70 - 99 mg/dL   BUN 20 6 - 20 mg/dL   Creatinine, Ser 1.07 0.61 - 1.24 mg/dL   Calcium 10.5 (H) 8.9 - 10.3 mg/dL   Total Protein 6.9 6.5 - 8.1 g/dL   Albumin 4.3 3.5 - 5.0 g/dL   AST 24 15 - 41 U/L   ALT 34 0 - 44 U/L   Alkaline Phosphatase 67 38 - 126 U/L   Total Bilirubin 0.8 0.3 - 1.2 mg/dL   GFR calc non Af Amer >60 >60 mL/min   GFR calc Af Amer >60 >60 mL/min   Anion gap 10 5 - 15  Type and screen     Status: None (Preliminary result)   Collection Time: 01/19/19  9:05 AM  Result Value Ref Range   ABO/RH(D) PENDING    Antibody Screen PENDING    Sample Expiration      01/22/2019,2359 Performed at [facility], [place] [address], [place], [place] [address]      Virtual Visit via Video   I connected with Donald Gilbert on 01/19/19 at 11:40 AM EST by a  video enabled telemedicine application and verified that I am speaking with the correct person using two identifiers. I discussed the limitations of evaluation and management by telemedicine and the availability of in person appointments. The patient expressed understanding and agreed to proceed.   Patient location: Home Provider location: [place] participating in the virtual visit: Myself and Patient     Donald Gilbert. Jerline Pain, MD 01/19/2019 10:59 AM

## 2019-01-19 NOTE — Progress Notes (Signed)
Difficult intubation Patient brought form to preop. Placed on front of chart . Konrad Felix ,PA   Aware and assesed pt. At preop.

## 2019-01-19 NOTE — Anesthesia Preprocedure Evaluation (Addendum)
Anesthesia Evaluation  Patient identified by MRN, date of birth, ID band Patient awake    Reviewed: Allergy & Precautions, NPO status , Patient's Chart, lab work & pertinent test results  History of Anesthesia Complications (+) DIFFICULT AIRWAY  Airway Mallampati: II  TM Distance: <3 FB Neck ROM: Full    Dental  (+) Teeth Intact, Dental Advisory Given   Pulmonary sleep apnea and Continuous Positive Airway Pressure Ventilation , former smoker,    breath sounds clear to auscultation       Cardiovascular hypertension, Pt. on home beta blockers and Pt. on medications  Rhythm:Regular Rate:Normal     Neuro/Psych Depression    GI/Hepatic negative GI ROS, Neg liver ROS,   Endo/Other  diabetes, Type 2  Renal/GU      Musculoskeletal  (+) Arthritis ,   Abdominal (+) + obese,   Peds  Hematology negative hematology ROS (+)   Anesthesia Other Findings   Reproductive/Obstetrics negative OB ROS                           Anesthesia Physical Anesthesia Plan  ASA: III  Anesthesia Plan: General   Post-op Pain Management:    Induction: Intravenous, Rapid sequence and Cricoid pressure planned  PONV Risk Score and Plan: 3 and Ondansetron, Dexamethasone and Midazolam  Airway Management Planned: Oral ETT and Video Laryngoscope Planned  Additional Equipment: None  Intra-op Plan:   Post-operative Plan: Extubation in OR  Informed Consent: I have reviewed the patients History and Physical, chart, labs and discussed the procedure including the risks, benefits and alternatives for the proposed anesthesia with the patient or authorized representative who has indicated his/her understanding and acceptance.     Dental advisory given  Plan Discussed with: CRNA  Anesthesia Plan Comments: (See PAT note 01/19/2019, Konrad Felix, PA-C)      Anesthesia Quick Evaluation

## 2019-01-19 NOTE — Progress Notes (Addendum)
Anesthesia Chart Review   Case: [id number] Date/Time: 01/22/19 1245   Procedure: LAPAROSCOPIC GASTRIC SLEEVE RESECTION, Upper Endo, Eras Pathway (N/A )   Anesthesia type: General   Pre-op diagnosis: Morbid Obesity   Location: [facility] ROOM 01 / [facility]   Surgeon: [name], MD      DISCUSSION:63 y.o. former smoker (quit 11/13/89) with h/o HLD, HTN, OSA on CPAP, DM II, morbid obesity scheduled for above procedure 01/22/2019 with Dr. [name].   Pt with letter from previous anesthesia stating difficult intubation, on chart.  Per note due to tongue enlargement, anterior larynx, and reduced mouth opening.  Bag mask ventilation was difficult, patient's airway ultimately secured asleep, "Intubating fast track LMA with tube placement."  Pt feels that the letter regarding difficult intubation is incorrect.  He reports the surgery date listed on the letter is not the date of his procedure.  Discussed with Dr. Marcie Bal, evaluate DOS.  VS: BP (!) 160/81   Pulse 66   Temp 36.8 C (Oral)   Resp 16   Ht 5\' 9"  (1.753 m)   Wt 128.4 kg   SpO2 98%   BMI 41.79 kg/m   PROVIDERS: Vivi Barrack, MD is PCP last seen 01/19/2019 stable at this visit.    LABS: Labs reviewed: Acceptable for surgery. (all labs ordered are listed, but only abnormal results are displayed)  Labs Reviewed  COMPREHENSIVE METABOLIC PANEL - Abnormal; Notable for the following components:      Result Value   Calcium 10.5 (*)    All other components within normal limits  GLUCOSE, CAPILLARY - Abnormal; Notable for the following components:   Glucose-Capillary 110 (*)    All other components within normal limits  CBC WITH DIFFERENTIAL/PLATELET  HEMOGLOBIN A1C  TYPE AND SCREEN  ABO/RH     IMAGES:   EKG: 06/02/2018 Rate 66 bpm Sinus rhythm  Nonspecific QRS widening   CV:  Past Medical History:  Diagnosis Date  . Allergy    mild   . Arthralgia of multiple joints 04/03/2017  . Arthritis   . Atopic dermatitis  04/03/2017  . BPPV (benign paroxysmal positional vertigo)   . Chickenpox   . Constipation    maybe twice a week due to job   . Diabetes mellitus without complication ([place])    type 2  . Difficult intubation   . Dysthymia 04/03/2017  . History of kidney stones    recurrent  . Hyperlipidemia   . Hypertension   . Insulin resistance 04/03/2017  . Nasal polyp   . OSA on CPAP   . Sleep apnea    wears cpap   11cm H20  . Urinary hesitancy 04/03/2017  . Vitamin D deficiency 04/03/2017    Past Surgical History:  Procedure Laterality Date  . COLONOSCOPY  03/2008   no precancerous polyps  . COLONOSCOPY WITH PROPOFOL N/A 10/02/2018   Procedure: COLONOSCOPY WITH PROPOFOL;  Surgeon: Doran Stabler, MD;  Location: [facility];  Service: Gastroenterology;  Laterality: N/A;  . LASIK    . TONSILLECTOMY    . UMBILICAL HERNIA REPAIR     2016  . uvula removed       MEDICATIONS: . allopurinol (ZYLOPRIM) 300 MG tablet  . aspirin EC 81 MG tablet  . buPROPion (WELLBUTRIN XL) 150 MG 24 hr tablet  . carvedilol (COREG) 12.5 MG tablet  . diclofenac (VOLTAREN) 75 MG EC tablet  . losartan-hydrochlorothiazide (HYZAAR) 100-25 MG tablet  . Multiple Vitamin (MULTIVITAMIN WITH MINERALS) TABS tablet  .  oxymetazoline (AFRIN) 0.05 % nasal spray  . simvastatin (ZOCOR) 40 MG tablet  . Vitamin D, Ergocalciferol, (DRISDOL) 1.25 MG (50000 UT) CAPS capsule   No current facility-administered medications for this encounter.     Janey Genta [facility] Pre[facility]Surgical Testing [phone] 01/19/19  4:06 PM

## 2019-01-19 NOTE — Assessment & Plan Note (Signed)
Continue wellbutrin 150mg  daily. Patient not sure if it is doing anything. Consider stopping after he completes his bariatric surgery.

## 2019-01-21 MED ORDER — BUPIVACAINE LIPOSOME 1.3 % IJ SUSP
20.0000 mL | Freq: Once | INTRAMUSCULAR | Status: DC
  Filled 2019-01-21: qty 20

## 2019-01-22 ENCOUNTER — Inpatient Hospital Stay (HOSPITAL_COMMUNITY)
Admission: RE | Admit: 2019-01-22 | Discharge: 2019-01-23 | DRG: 621 | Disposition: A | Discharge status: Home / Self Care | Payer: BC Managed Care – PPO | Attending: General Surgery | Admitting: General Surgery

## 2019-01-22 ENCOUNTER — Encounter (HOSPITAL_COMMUNITY): Payer: Self-pay

## 2019-01-22 ENCOUNTER — Ambulatory Visit (HOSPITAL_COMMUNITY): Payer: BC Managed Care – PPO | Admitting: Anesthesiology

## 2019-01-22 ENCOUNTER — Ambulatory Visit (HOSPITAL_COMMUNITY): Payer: BC Managed Care – PPO | Admitting: Physician Assistant

## 2019-01-22 ENCOUNTER — Other Ambulatory Visit: Payer: Self-pay

## 2019-01-22 ENCOUNTER — Encounter (HOSPITAL_COMMUNITY)
Admission: RE | Disposition: A | Discharge status: Home / Self Care | Payer: Self-pay | Source: Home / Self Care | Attending: General Surgery

## 2019-01-22 DIAGNOSIS — G4733 Obstructive sleep apnea (adult) (pediatric): Secondary | ICD-10-CM | POA: Diagnosis not present

## 2019-01-22 DIAGNOSIS — E559 Vitamin D deficiency, unspecified: Secondary | ICD-10-CM | POA: Diagnosis present

## 2019-01-22 DIAGNOSIS — R7303 Prediabetes: Secondary | ICD-10-CM | POA: Diagnosis present

## 2019-01-22 DIAGNOSIS — M19041 Primary osteoarthritis, right hand: Secondary | ICD-10-CM | POA: Diagnosis present

## 2019-01-22 DIAGNOSIS — K5900 Constipation, unspecified: Secondary | ICD-10-CM | POA: Diagnosis not present

## 2019-01-22 DIAGNOSIS — Z8249 Family history of ischemic heart disease and other diseases of the circulatory system: Secondary | ICD-10-CM | POA: Diagnosis not present

## 2019-01-22 DIAGNOSIS — K449 Diaphragmatic hernia without obstruction or gangrene: Secondary | ICD-10-CM | POA: Diagnosis present

## 2019-01-22 DIAGNOSIS — E782 Mixed hyperlipidemia: Secondary | ICD-10-CM | POA: Diagnosis not present

## 2019-01-22 DIAGNOSIS — Z87442 Personal history of urinary calculi: Secondary | ICD-10-CM

## 2019-01-22 DIAGNOSIS — K660 Peritoneal adhesions (postprocedural) (postinfection): Secondary | ICD-10-CM | POA: Diagnosis present

## 2019-01-22 DIAGNOSIS — Z6839 Body mass index (BMI) 39.0-39.9, adult: Secondary | ICD-10-CM | POA: Diagnosis not present

## 2019-01-22 DIAGNOSIS — I10 Essential (primary) hypertension: Secondary | ICD-10-CM | POA: Diagnosis not present

## 2019-01-22 DIAGNOSIS — Z9884 Bariatric surgery status: Secondary | ICD-10-CM

## 2019-01-22 DIAGNOSIS — Z6841 Body Mass Index (BMI) 40.0 and over, adult: Secondary | ICD-10-CM

## 2019-01-22 DIAGNOSIS — Z86010 Personal history of colonic polyps: Secondary | ICD-10-CM

## 2019-01-22 DIAGNOSIS — Z8261 Family history of arthritis: Secondary | ICD-10-CM | POA: Diagnosis not present

## 2019-01-22 DIAGNOSIS — E669 Obesity, unspecified: Secondary | ICD-10-CM

## 2019-01-22 HISTORY — PX: LAPAROSCOPIC GASTRIC SLEEVE RESECTION: SHX5895

## 2019-01-22 LAB — TYPE AND SCREEN
ABO/RH(D): O POS
Antibody Screen: NEGATIVE

## 2019-01-22 LAB — HEMOGLOBIN AND HEMATOCRIT, BLOOD
HCT: 43.6 % (ref 39.0–52.0)
Hemoglobin: 14.7 g/dL (ref 13.0–17.0)

## 2019-01-22 LAB — GLUCOSE, CAPILLARY
Glucose-Capillary: 93 mg/dL (ref 70–99)
Glucose-Capillary: 122 mg/dL — ABNORMAL HIGH (ref 70–99)
Glucose-Capillary: 132 mg/dL — ABNORMAL HIGH (ref 70–99)

## 2019-01-22 SURGERY — GASTRECTOMY, SLEEVE, LAPAROSCOPIC
Anesthesia: General | Site: Abdomen

## 2019-01-22 MED ORDER — CARVEDILOL 12.5 MG PO TABS
12.5000 mg | ORAL_TABLET | Freq: Two times a day (BID) | ORAL | Status: DC
  Filled 2019-01-22: qty 1

## 2019-01-22 MED ORDER — PROPOFOL 10 MG/ML IV BOLUS
INTRAVENOUS | Status: DC | PRN
  Administered 2019-01-22: 150 mg via INTRAVENOUS

## 2019-01-22 MED ORDER — LACTATED RINGERS IV SOLN: INTRAVENOUS | Status: DC

## 2019-01-22 MED ORDER — MIDAZOLAM HCL 2 MG/2ML IJ SOLN
INTRAMUSCULAR | Status: DC | PRN
  Administered 2019-01-22: 2 mg via INTRAVENOUS

## 2019-01-22 MED ORDER — DEXAMETHASONE SODIUM PHOSPHATE 10 MG/ML IJ SOLN
INTRAMUSCULAR | Status: AC
  Filled 2019-01-22: qty 1

## 2019-01-22 MED ORDER — ROCURONIUM BROMIDE 10 MG/ML (PF) SYRINGE
PREFILLED_SYRINGE | INTRAVENOUS | Status: AC
  Filled 2019-01-22: qty 10

## 2019-01-22 MED ORDER — SODIUM CHLORIDE 0.9 % IV SOLN
2.0000 g | INTRAVENOUS | Status: AC
  Administered 2019-01-22: 2 g via INTRAVENOUS
  Filled 2019-01-22: qty 2

## 2019-01-22 MED ORDER — ONDANSETRON HCL 4 MG/2ML IJ SOLN: 4.0000 mg | Freq: Four times a day (QID) | INTRAMUSCULAR | Status: DC | PRN

## 2019-01-22 MED ORDER — SUGAMMADEX SODIUM 500 MG/5ML IV SOLN
INTRAVENOUS | Status: AC
  Filled 2019-01-22: qty 5

## 2019-01-22 MED ORDER — LOSARTAN POTASSIUM 50 MG PO TABS
100.0000 mg | ORAL_TABLET | Freq: Every day | ORAL | Status: DC
  Administered 2019-01-23: 100 mg via ORAL
  Filled 2019-01-22: qty 2

## 2019-01-22 MED ORDER — EPHEDRINE SULFATE-NACL 50-0.9 MG/10ML-% IV SOSY
PREFILLED_SYRINGE | INTRAVENOUS | Status: DC | PRN
  Administered 2019-01-22 (×5): 10 mg via INTRAVENOUS

## 2019-01-22 MED ORDER — ENOXAPARIN SODIUM 30 MG/0.3ML ~~LOC~~ SOLN
30.0000 mg | Freq: Two times a day (BID) | SUBCUTANEOUS | Status: DC
  Administered 2019-01-22 – 2019-01-23 (×2): 30 mg via SUBCUTANEOUS
  Filled 2019-01-22 (×2): qty 0.3

## 2019-01-22 MED ORDER — CHLORHEXIDINE GLUCONATE 4 % EX LIQD: 60.0000 mL | Freq: Once | CUTANEOUS | Status: DC

## 2019-01-22 MED ORDER — FENTANYL CITRATE (PF) 250 MCG/5ML IJ SOLN
INTRAMUSCULAR | Status: AC
  Filled 2019-01-22: qty 5

## 2019-01-22 MED ORDER — ALLOPURINOL 300 MG PO TABS: 300.0000 mg | ORAL_TABLET | Freq: Every evening | ORAL | Status: DC

## 2019-01-22 MED ORDER — ACETAMINOPHEN 500 MG PO TABS
1000.0000 mg | ORAL_TABLET | Freq: Three times a day (TID) | ORAL | Status: DC
  Administered 2019-01-22 – 2019-01-23 (×2): 1000 mg via ORAL
  Filled 2019-01-22 (×2): qty 2

## 2019-01-22 MED ORDER — ENOXAPARIN (LOVENOX) PATIENT EDUCATION KIT
PACK | Freq: Once | Status: AC
  Administered 2019-01-22: 22:00:00
  Filled 2019-01-22: qty 1

## 2019-01-22 MED ORDER — FENTANYL CITRATE (PF) 100 MCG/2ML IJ SOLN
INTRAMUSCULAR | Status: AC
  Filled 2019-01-22: qty 4

## 2019-01-22 MED ORDER — SUGAMMADEX SODIUM 500 MG/5ML IV SOLN
INTRAVENOUS | Status: DC | PRN
  Administered 2019-01-22: 450 mg via INTRAVENOUS

## 2019-01-22 MED ORDER — ACETAMINOPHEN 325 MG PO TABS: 325.0000 mg | ORAL_TABLET | Freq: Once | ORAL | Status: DC | PRN

## 2019-01-22 MED ORDER — ENALAPRILAT 1.25 MG/ML IV SOLN
1.2500 mg | Freq: Four times a day (QID) | INTRAVENOUS | Status: DC | PRN
  Filled 2019-01-22: qty 1

## 2019-01-22 MED ORDER — HYDROCHLOROTHIAZIDE 25 MG PO TABS
25.0000 mg | ORAL_TABLET | Freq: Every day | ORAL | Status: DC
  Administered 2019-01-23: 25 mg via ORAL
  Filled 2019-01-22: qty 1

## 2019-01-22 MED ORDER — LACTATED RINGERS IV SOLN
INTRAVENOUS | Status: DC
  Administered 2019-01-22 (×2): via INTRAVENOUS

## 2019-01-22 MED ORDER — GLYCOPYRROLATE 0.2 MG/ML IJ SOLN
INTRAMUSCULAR | Status: DC | PRN
  Administered 2019-01-22: 0.2 mg via INTRAVENOUS

## 2019-01-22 MED ORDER — GABAPENTIN 100 MG PO CAPS
200.0000 mg | ORAL_CAPSULE | Freq: Two times a day (BID) | ORAL | Status: DC
  Administered 2019-01-22 – 2019-01-23 (×2): 200 mg via ORAL
  Filled 2019-01-22 (×2): qty 2

## 2019-01-22 MED ORDER — ENSURE MAX PROTEIN PO LIQD
2.0000 [oz_av] | ORAL | Status: DC
  Administered 2019-01-23 (×4): 2 [oz_av] via ORAL

## 2019-01-22 MED ORDER — PROPOFOL 10 MG/ML IV BOLUS
INTRAVENOUS | Status: AC
  Filled 2019-01-22: qty 20

## 2019-01-22 MED ORDER — ACETAMINOPHEN 10 MG/ML IV SOLN: 1000.0000 mg | Freq: Once | INTRAVENOUS | Status: DC | PRN

## 2019-01-22 MED ORDER — DIPHENHYDRAMINE HCL 50 MG/ML IJ SOLN: 12.5000 mg | Freq: Three times a day (TID) | INTRAMUSCULAR | Status: DC | PRN

## 2019-01-22 MED ORDER — INSULIN ASPART 100 UNIT/ML ~~LOC~~ SOLN
0.0000 [IU] | SUBCUTANEOUS | Status: DC
  Administered 2019-01-22 – 2019-01-23 (×4): 3 [IU] via SUBCUTANEOUS

## 2019-01-22 MED ORDER — BUPIVACAINE LIPOSOME 1.3 % IJ SUSP
INTRAMUSCULAR | Status: DC | PRN
  Administered 2019-01-22: 20 mL

## 2019-01-22 MED ORDER — EPHEDRINE 5 MG/ML INJ
INTRAVENOUS | Status: AC
  Filled 2019-01-22: qty 20

## 2019-01-22 MED ORDER — LOSARTAN POTASSIUM-HCTZ 100-25 MG PO TABS: 1.0000 | ORAL_TABLET | Freq: Every day | ORAL | Status: DC

## 2019-01-22 MED ORDER — LIDOCAINE 2% (20 MG/ML) 5 ML SYRINGE
INTRAMUSCULAR | Status: DC | PRN
  Administered 2019-01-22: 1.5 mg/kg/h via INTRAVENOUS

## 2019-01-22 MED ORDER — PANTOPRAZOLE SODIUM 40 MG IV SOLR
40.0000 mg | Freq: Every day | INTRAVENOUS | Status: DC
  Administered 2019-01-22: 40 mg via INTRAVENOUS
  Filled 2019-01-22: qty 40

## 2019-01-22 MED ORDER — MIDAZOLAM HCL 2 MG/2ML IJ SOLN
INTRAMUSCULAR | Status: AC
  Filled 2019-01-22: qty 2

## 2019-01-22 MED ORDER — DEXAMETHASONE SODIUM PHOSPHATE 4 MG/ML IJ SOLN: 4.0000 mg | INTRAMUSCULAR | Status: DC

## 2019-01-22 MED ORDER — ACETAMINOPHEN 500 MG PO TABS
1000.0000 mg | ORAL_TABLET | ORAL | Status: AC
  Administered 2019-01-22: 1000 mg via ORAL
  Filled 2019-01-22: qty 2

## 2019-01-22 MED ORDER — MEPERIDINE HCL 50 MG/ML IJ SOLN: 6.2500 mg | INTRAMUSCULAR | Status: DC | PRN

## 2019-01-22 MED ORDER — KETAMINE HCL 10 MG/ML IJ SOLN
INTRAMUSCULAR | Status: DC | PRN
  Administered 2019-01-22: 70 mg via INTRAVENOUS

## 2019-01-22 MED ORDER — POTASSIUM CHLORIDE IN NACL 20-0.45 MEQ/L-% IV SOLN
INTRAVENOUS | Status: DC
  Administered 2019-01-22 – 2019-01-23 (×2): via INTRAVENOUS
  Filled 2019-01-22 (×3): qty 1000

## 2019-01-22 MED ORDER — ACETAMINOPHEN 160 MG/5ML PO SOLN
1000.0000 mg | Freq: Three times a day (TID) | ORAL | Status: DC
  Filled 2019-01-22: qty 40.6

## 2019-01-22 MED ORDER — OXYCODONE HCL 5 MG/5ML PO SOLN
5.0000 mg | Freq: Four times a day (QID) | ORAL | Status: DC | PRN
  Administered 2019-01-23: 5 mg via ORAL
  Filled 2019-01-22: qty 5

## 2019-01-22 MED ORDER — HEPARIN SODIUM (PORCINE) 5000 UNIT/ML IJ SOLN
5000.0000 [IU] | INTRAMUSCULAR | Status: AC
  Administered 2019-01-22: 5000 [IU] via SUBCUTANEOUS
  Filled 2019-01-22: qty 1

## 2019-01-22 MED ORDER — GABAPENTIN 300 MG PO CAPS
300.0000 mg | ORAL_CAPSULE | ORAL | Status: AC
  Administered 2019-01-22: 300 mg via ORAL
  Filled 2019-01-22: qty 1

## 2019-01-22 MED ORDER — SODIUM CHLORIDE (PF) 0.9 % IJ SOLN
INTRAMUSCULAR | Status: DC | PRN
  Administered 2019-01-22: 50 mL

## 2019-01-22 MED ORDER — ONDANSETRON HCL 4 MG/2ML IJ SOLN
INTRAMUSCULAR | Status: AC
  Filled 2019-01-22: qty 2

## 2019-01-22 MED ORDER — ROCURONIUM BROMIDE 10 MG/ML (PF) SYRINGE
PREFILLED_SYRINGE | INTRAVENOUS | Status: DC | PRN
  Administered 2019-01-22: 50 mg via INTRAVENOUS
  Administered 2019-01-22: 10 mg via INTRAVENOUS
  Administered 2019-01-22: 20 mg via INTRAVENOUS
  Administered 2019-01-22: 10 mg via INTRAVENOUS

## 2019-01-22 MED ORDER — APREPITANT 40 MG PO CAPS
40.0000 mg | ORAL_CAPSULE | ORAL | Status: AC
  Administered 2019-01-22: 40 mg via ORAL
  Filled 2019-01-22: qty 1

## 2019-01-22 MED ORDER — DEXAMETHASONE SODIUM PHOSPHATE 10 MG/ML IJ SOLN
INTRAMUSCULAR | Status: DC | PRN
  Administered 2019-01-22: 10 mg via INTRAVENOUS

## 2019-01-22 MED ORDER — SUCCINYLCHOLINE CHLORIDE 200 MG/10ML IV SOSY
PREFILLED_SYRINGE | INTRAVENOUS | Status: DC | PRN
  Administered 2019-01-22: 140 mg via INTRAVENOUS

## 2019-01-22 MED ORDER — SCOPOLAMINE 1 MG/3DAYS TD PT72
1.0000 | MEDICATED_PATCH | TRANSDERMAL | Status: DC
  Administered 2019-01-22: 1.5 mg via TRANSDERMAL
  Filled 2019-01-22: qty 1

## 2019-01-22 MED ORDER — ONDANSETRON HCL 4 MG/2ML IJ SOLN
INTRAMUSCULAR | Status: DC | PRN
  Administered 2019-01-22: 4 mg via INTRAVENOUS

## 2019-01-22 MED ORDER — SODIUM CHLORIDE (PF) 0.9 % IJ SOLN
INTRAMUSCULAR | Status: AC
  Filled 2019-01-22: qty 50

## 2019-01-22 MED ORDER — ACETAMINOPHEN 160 MG/5ML PO SOLN: 325.0000 mg | Freq: Once | ORAL | Status: DC | PRN

## 2019-01-22 MED ORDER — LIDOCAINE 2% (20 MG/ML) 5 ML SYRINGE
INTRAMUSCULAR | Status: AC
  Filled 2019-01-22: qty 5

## 2019-01-22 MED ORDER — PROMETHAZINE HCL 25 MG/ML IJ SOLN: 12.5000 mg | Freq: Four times a day (QID) | INTRAMUSCULAR | Status: DC | PRN

## 2019-01-22 MED ORDER — MORPHINE SULFATE (PF) 4 MG/ML IV SOLN
1.0000 mg | INTRAVENOUS | Status: DC | PRN
  Administered 2019-01-22 – 2019-01-23 (×2): 2 mg via INTRAVENOUS
  Filled 2019-01-22 (×2): qty 1

## 2019-01-22 MED ORDER — LACTATED RINGERS IR SOLN
Status: DC | PRN
  Administered 2019-01-22: 1000 mL

## 2019-01-22 MED ORDER — SIMETHICONE 80 MG PO CHEW: 80.0000 mg | CHEWABLE_TABLET | Freq: Four times a day (QID) | ORAL | Status: DC | PRN

## 2019-01-22 MED ORDER — PROMETHAZINE HCL 25 MG/ML IJ SOLN: 6.2500 mg | INTRAMUSCULAR | Status: DC | PRN

## 2019-01-22 MED ORDER — FENTANYL CITRATE (PF) 250 MCG/5ML IJ SOLN
INTRAMUSCULAR | Status: DC | PRN
  Administered 2019-01-22: 50 ug via INTRAVENOUS
  Administered 2019-01-22: 125 ug via INTRAVENOUS

## 2019-01-22 MED ORDER — LIDOCAINE HCL 2 % IJ SOLN
INTRAMUSCULAR | Status: AC
  Filled 2019-01-22: qty 20

## 2019-01-22 MED ORDER — FENTANYL CITRATE (PF) 100 MCG/2ML IJ SOLN
25.0000 ug | INTRAMUSCULAR | Status: DC | PRN
  Administered 2019-01-22 (×2): 50 ug via INTRAVENOUS

## 2019-01-22 MED ORDER — LIDOCAINE 2% (20 MG/ML) 5 ML SYRINGE
INTRAMUSCULAR | Status: DC | PRN
  Administered 2019-01-22: 40 mg via INTRAVENOUS

## 2019-01-22 MED ORDER — 0.9 % SODIUM CHLORIDE (POUR BTL) OPTIME
TOPICAL | Status: DC | PRN
  Administered 2019-01-22: 14:00:00 1000 mL

## 2019-01-22 SURGICAL SUPPLY — 78 items
APPLICATOR COTTON TIP 6 STRL (MISCELLANEOUS) IMPLANT
APPLICATOR COTTON TIP 6IN STRL (MISCELLANEOUS)
APPLIER CLIP ROT 10 11.4 M/L (STAPLE)
APPLIER CLIP ROT 13.4 12 LRG (CLIP) ×2
BENZOIN TINCTURE PRP APPL 2/3 (GAUZE/BANDAGES/DRESSINGS) ×2 IMPLANT
BLADE SURG SZ11 CARB STEEL (BLADE) ×2 IMPLANT
BNDG ADH 1X3 SHEER STRL LF (GAUZE/BANDAGES/DRESSINGS) ×12 IMPLANT
CABLE HIGH FREQUENCY MONO STRZ (ELECTRODE) IMPLANT
CHLORAPREP W/TINT 26 (MISCELLANEOUS) ×4 IMPLANT
CLIP APPLIE ROT 10 11.4 M/L (STAPLE) IMPLANT
CLIP APPLIE ROT 13.4 12 LRG (CLIP) ×1 IMPLANT
COVER SURGICAL LIGHT HANDLE (MISCELLANEOUS) ×2 IMPLANT
COVER WAND RF STERILE (DRAPES) IMPLANT
DECANTER SPIKE VIAL GLASS SM (MISCELLANEOUS) ×2 IMPLANT
DEVICE SUT QUICK LOAD TK 5 (STAPLE) ×4 IMPLANT
DEVICE SUT TI-KNOT TK 5X26 (MISCELLANEOUS) ×2 IMPLANT
DEVICE SUTURE ENDOST 10MM (ENDOMECHANICALS) ×2 IMPLANT
DISSECTOR BLUNT TIP ENDO 5MM (MISCELLANEOUS) ×2 IMPLANT
DRAPE UTILITY XL STRL (DRAPES) ×4 IMPLANT
ELECT L-HOOK LAP 45CM DISP (ELECTROSURGICAL) ×2
ELECT PENCIL ROCKER SW 15FT (MISCELLANEOUS) ×2 IMPLANT
ELECT REM PT RETURN 15FT ADLT (MISCELLANEOUS) ×2 IMPLANT
ELECTRODE L-HOOK LAP 45CM DISP (ELECTROSURGICAL) ×1 IMPLANT
GAUZE SPONGE 2X2 8PLY STRL LF (GAUZE/BANDAGES/DRESSINGS) IMPLANT
GAUZE SPONGE 4X4 12PLY STRL (GAUZE/BANDAGES/DRESSINGS) IMPLANT
GLOVE BIO SURGEON STRL SZ7.5 (GLOVE) ×2 IMPLANT
GLOVE BIOGEL M 8.0 STRL (GLOVE) ×2 IMPLANT
GLOVE BIOGEL PI IND STRL 6.5 (GLOVE) ×1 IMPLANT
GLOVE BIOGEL PI IND STRL 7.0 (GLOVE) ×3 IMPLANT
GLOVE BIOGEL PI INDICATOR 6.5 (GLOVE) ×1
GLOVE BIOGEL PI INDICATOR 7.0 (GLOVE) ×3
GLOVE INDICATOR 6.5 STRL GRN (GLOVE) ×2 IMPLANT
GLOVE INDICATOR 8.0 STRL GRN (GLOVE) ×2 IMPLANT
GOWN STRL REUS W/ TWL LRG LVL3 (GOWN DISPOSABLE) ×1 IMPLANT
GOWN STRL REUS W/TWL LRG LVL3 (GOWN DISPOSABLE) ×1
GOWN STRL REUS W/TWL XL LVL3 (GOWN DISPOSABLE) ×6 IMPLANT
GRASPER SUT TROCAR 14GX15 (MISCELLANEOUS) ×2 IMPLANT
HOVERMATT SINGLE USE (MISCELLANEOUS) ×2 IMPLANT
KIT BASIN OR (CUSTOM PROCEDURE TRAY) ×2 IMPLANT
KIT TURNOVER KIT A (KITS) IMPLANT
MARKER SKIN DUAL TIP RULER LAB (MISCELLANEOUS) ×2 IMPLANT
NEEDLE SPNL 22GX3.5 QUINCKE BK (NEEDLE) ×2 IMPLANT
PACK UNIVERSAL I (CUSTOM PROCEDURE TRAY) ×2 IMPLANT
PENCIL SMOKE EVACUATOR (MISCELLANEOUS) IMPLANT
RELOAD STAPLER 60MM BLK (STAPLE) IMPLANT
RELOAD STAPLER BLUE 60MM (STAPLE) ×4 IMPLANT
RELOAD STAPLER GOLD 60MM (STAPLE) ×1 IMPLANT
RELOAD STAPLER GREEN 60MM (STAPLE) ×1 IMPLANT
SCISSORS LAP 5X45 EPIX DISP (ENDOMECHANICALS) ×2 IMPLANT
SEALANT SURGICAL APPL DUAL CAN (MISCELLANEOUS) IMPLANT
SET IRRIG TUBING LAPAROSCOPIC (IRRIGATION / IRRIGATOR) ×2 IMPLANT
SET TUBE SMOKE EVAC HIGH FLOW (TUBING) ×2 IMPLANT
SHEARS HARMONIC ACE PLUS 45CM (MISCELLANEOUS) ×2 IMPLANT
SLEEVE GASTRECTOMY 40FR VISIGI (MISCELLANEOUS) ×2 IMPLANT
SLEEVE XCEL OPT CAN 5 100 (ENDOMECHANICALS) ×6 IMPLANT
SOL ANTI FOG 6CC (MISCELLANEOUS) ×1 IMPLANT
SOLUTION ANTI FOG 6CC (MISCELLANEOUS) ×1
SPONGE GAUZE 2X2 STER 10/PKG (GAUZE/BANDAGES/DRESSINGS)
SPONGE LAP 18X18 RF (DISPOSABLE) ×2 IMPLANT
STAPLER ECHELON BIOABSB 60 FLE (MISCELLANEOUS) ×12 IMPLANT
STAPLER ECHELON LONG 60 440 (INSTRUMENTS) ×2 IMPLANT
STAPLER RELOAD 60MM BLK (STAPLE)
STAPLER RELOAD BLUE 60MM (STAPLE) ×8
STAPLER RELOAD GOLD 60MM (STAPLE) ×2
STAPLER RELOAD GREEN 60MM (STAPLE) ×2
STRIP CLOSURE SKIN 1/2X4 (GAUZE/BANDAGES/DRESSINGS) ×2 IMPLANT
SUT MNCRL AB 4-0 PS2 18 (SUTURE) ×2 IMPLANT
SUT SURGIDAC NAB ES-9 0 48 120 (SUTURE) ×4 IMPLANT
SUT VICRYL 0 TIES 12 18 (SUTURE) ×2 IMPLANT
SYR 20ML LL LF (SYRINGE) ×2 IMPLANT
SYR 50ML LL SCALE MARK (SYRINGE) ×2 IMPLANT
TOWEL OR 17X26 10 PK STRL BLUE (TOWEL DISPOSABLE) ×2 IMPLANT
TOWEL OR NON WOVEN STRL DISP B (DISPOSABLE) ×2 IMPLANT
TROCAR BLADELESS 15MM (ENDOMECHANICALS) ×2 IMPLANT
TROCAR BLADELESS OPT 5 100 (ENDOMECHANICALS) ×2 IMPLANT
TUBE CALIBRATION LAPBAND (TUBING) ×2 IMPLANT
TUBING CONNECTING 10 (TUBING) ×2 IMPLANT
TUBING ENDO SMARTCAP (MISCELLANEOUS) ×2 IMPLANT

## 2019-01-22 NOTE — Interval H&P Note (Signed)
History and Physical Interval Note:  01/22/2019 12:13 PM  Donald Gilbert  has presented today for surgery, with the diagnosis of Morbid Obesity.  The various methods of treatment have been discussed with the patient and family. After consideration of risks, benefits and other options for treatment, the patient has consented to  Procedure(s): LAPAROSCOPIC GASTRIC SLEEVE RESECTION, Upper Endo, Eras Pathway (N/A) as a surgical intervention.  The patient's history has been reviewed, patient examined, no change in status, stable for surgery.  I have reviewed the patient's chart and labs.  Questions were answered to the patient's satisfaction.     [name]

## 2019-01-22 NOTE — Anesthesia Postprocedure Evaluation (Signed)
Anesthesia Post Note  Patient: TELVIN REINDERS  Procedure(s) Performed: LAPAROSCOPIC GASTRIC SLEEVE RESECTION AND HIATAL HERNIA REPAIR, Upper Endo, Eras Pathway (N/A Abdomen)     Patient location during evaluation: PACU Anesthesia Type: General Level of consciousness: awake and alert Pain management: pain level controlled Vital Signs Assessment: post-procedure vital signs reviewed and stable Respiratory status: spontaneous breathing, nonlabored ventilation, respiratory function stable and patient connected to nasal cannula oxygen Cardiovascular status: blood pressure returned to baseline and stable Postop Assessment: no apparent nausea or vomiting Anesthetic complications: no    Last Vitals:  Vitals:   01/22/19 1545 01/22/19 1600  BP: (!) 142/84 138/83  Pulse: 72 68  Resp: 14 15  Temp:  36.6 C  SpO2: 98% 95%    Last Pain:  Vitals:   01/22/19 1600  TempSrc:   PainSc: Asleep                 Jovi Zavadil,W. EDMOND

## 2019-01-22 NOTE — Op Note (Signed)
ISAO SELTZER [id number] [birth year] 01/22/2019  Preoperative diagnosis: sleeve gastrectomy in progress  Postoperative diagnosis: Same   Procedure: Upper endoscopy   Surgeon: Catalina Antigua B. Hassell Done  M.D., FACS   Anesthesia: Gen.   Indications for procedure: This patient was undergoing a sleeve gastrectomy by Dr. [name].    Description of procedure: The endoscopy was placed in the mouth and into the oropharynx and under endoscopic vision it was advanced to the esophagogastric junction.  The pouch was insufflated and the sleeve appeared symmetrical and without bleeding. The pylorus was identified and appropriate atrum remained.   The EG junction was at 45 cm.  It was a little patulous.  .   No bleeding or leaks were detected.  The scope was withdrawn without difficulty.     Matt B. Hassell Done, MD, FACS General, Bariatric, & Minimally Invasive Surgery [facility], [place]

## 2019-01-22 NOTE — Anesthesia Procedure Notes (Signed)
Procedure Name: Intubation Date/Time: 01/22/2019 1:30 PM Performed by: Sharlette Dense, CRNA Patient Re-evaluated:Patient Re-evaluated prior to induction Oxygen Delivery Method: Circle system utilized Preoxygenation: Pre-oxygenation with 100% oxygen Induction Type: IV induction and Rapid sequence Laryngoscope Size: Glidescope and 4 Grade View: Grade II Tube type: Parker flex tip Tube size: 7.5 mm Number of attempts: 2 Airway Equipment and Method: Video-laryngoscopy and Stylet Placement Confirmation: ETT inserted through vocal cords under direct vision,  positive ETCO2 and breath sounds checked- equal and bilateral Secured at: 22 cm Tube secured with: Tape Dental Injury: Teeth and Oropharynx as per pre-operative assessment  Difficulty Due To: Difficulty was anticipated, Difficult Airway- due to large tongue, Difficult Airway- due to reduced neck mobility and Difficult Airway- due to anterior larynx

## 2019-01-22 NOTE — Progress Notes (Signed)
Patient alert and oriented.    Provided support and encouragement.  Encouraged pulmonary toilet, ambulation and small sips of liquids. Pt walked 2 laps in hallway. Postop pain management sheet given and discussed with pt.  All questions answered.  Will continue to monitor.

## 2019-01-22 NOTE — Progress Notes (Signed)
Patient teaching completed on lovenox administration for home use. All questions answered.

## 2019-01-22 NOTE — Transfer of Care (Signed)
Immediate Anesthesia Transfer of Care Note  Patient: Donald Gilbert  Procedure(s) Performed: LAPAROSCOPIC GASTRIC SLEEVE RESECTION AND HIATAL HERNIA REPAIR, Upper Endo, Eras Pathway (N/A Abdomen)  Patient Location: PACU  Anesthesia Type:General  Level of Consciousness: awake, alert  and patient cooperative  Airway & Oxygen Therapy: Patient Spontanous Breathing and Patient connected to face mask oxygen  Post-op Assessment: Report given to RN and Post -op Vital signs reviewed and stable  Post vital signs: Reviewed and stable  Last Vitals:  Vitals Value Taken Time  BP 138/82 01/22/19 1530  Temp 36.5 C 01/22/19 1530  Pulse 66 01/22/19 1532  Resp 16 01/22/19 1532  SpO2 100 % 01/22/19 1532  Vitals shown include unvalidated device data.  Last Pain:  Vitals:   01/22/19 1530  TempSrc:   PainSc: Asleep         Complications: No apparent anesthesia complications

## 2019-01-22 NOTE — Progress Notes (Signed)
Patient has walked 3 laps around unit tonight and has demonstrated IS use. Patient has completed 12 oz of water and has started protein. No complaints at this time.

## 2019-01-22 NOTE — Progress Notes (Signed)
PHARMACY CONSULT FOR:  Risk Assessment for Post-Discharge VTE Following Bariatric Surgery  Post-Discharge VTE Risk Assessment: This patient's probability of 30-day post-discharge VTE is increased due to the factors marked: X  Male  X  Age >/=60 years    BMI >/=50 kg/m2    CHF    Dyspnea at Rest    Paraplegia  X  Non-gastric-band surgery    Operation Time >/=3 hr    Return to OR     Length of Stay >/= 3 d      Hx of VTE   Hypercoagulable condition   Significant venous stasis   Predicted probability of 30-day post-discharge VTE: 0.59%  Other patient-specific factors to consider: none   Recommendation for Discharge: . Enoxaparin 40 mg [place] q12h x 2 weeks post-discharge . Case manager consulted to review insurance coverage and provide estimated patient cost . Follow daily and recalculate estimated 30d VTE risk if returns to OR or LOS reaches 3 days.   Donald Gilbert is a 63 y.o. male who underwent laparoscopic sleeve gastrectomy on 01/22/19.   Case start: 1347 Case end: 1519   No Known Allergies  Patient Measurements: Height: 5\' 9"  (175.3 cm) Weight: 269 lb 2 oz (122.1 kg) IBW/kg (Calculated) : 70.7 Body mass index is 39.74 kg/m.  Recent Labs    01/22/19 1548  HGB 14.7  HCT 43.6   Estimated Creatinine Clearance: 94.8 mL/min (by C-G formula based on SCr of 1.07 mg/dL).    Past Medical History:  Diagnosis Date  . Allergy    mild   . Arthralgia of multiple joints 04/03/2017  . Arthritis   . Atopic dermatitis 04/03/2017  . BPPV (benign paroxysmal positional vertigo)   . Chickenpox   . Constipation    maybe twice a week due to job   . Diabetes mellitus without complication ([facility])    type 2  . Difficult intubation   . Dysthymia 04/03/2017  . History of kidney stones    recurrent  . Hyperlipidemia   . Hypertension   . Insulin resistance 04/03/2017  . Nasal polyp   . OSA on CPAP   . Sleep apnea    wears cpap   11cm H20  . Urinary hesitancy 04/03/2017  .  Vitamin D deficiency 04/03/2017     Medications Prior to Admission  Medication Sig Dispense Refill Last Dose  . allopurinol (ZYLOPRIM) 300 MG tablet TAKE 1 TABLET(300 MG) BY MOUTH DAILY (Patient taking differently: Take 300 mg by mouth every evening. ) 90 tablet 2 01/21/2019 at Unknown time  . aspirin EC 81 MG tablet Take 81 mg by mouth daily.   01/01/2019  . buPROPion (WELLBUTRIN XL) 150 MG 24 hr tablet TAKE 1 TABLET(150 MG) BY MOUTH DAILY 60 tablet 0 01/22/2019 at 0700  . carvedilol (COREG) 12.5 MG tablet Take 1 tablet (12.5 mg total) by mouth 2 (two) times daily with a meal. 180 tablet 0 01/22/2019 at 0700  . diclofenac (VOLTAREN) 75 MG EC tablet TAKE 1 TABLET(75 MG) BY MOUTH TWICE DAILY (Patient taking differently: Take 75 mg by mouth 2 (two) times daily as needed (pain.). ) 180 tablet 0 01/21/2019 at Unknown time  . losartan-hydrochlorothiazide (HYZAAR) 100-25 MG tablet TAKE 1 TABLET BY MOUTH DAILY 90 tablet 3 01/21/2019 at Unknown time  . Multiple Vitamin (MULTIVITAMIN WITH MINERALS) TABS tablet Take 1 tablet by mouth daily.   01/21/2019 at Unknown time  . oxymetazoline (AFRIN) 0.05 % nasal spray Place 1 spray into both nostrils at  bedtime as needed for congestion.    01/21/2019 at Unknown time  . simvastatin (ZOCOR) 40 MG tablet TAKE 1 TABLET(40 MG) BY MOUTH DAILY (Patient taking differently: Take 40 mg by mouth daily. ) 90 tablet 2 01/22/2019 at 0700  . Vitamin D, Ergocalciferol, (DRISDOL) 1.25 MG (50000 UT) CAPS capsule Take 50,000 Units by mouth every Friday.   01/21/2019 at Unknown time      Reuel Boom, PharmD, BCPS [phone] 01/22/2019, 4:02 PM

## 2019-01-22 NOTE — Op Note (Signed)
01/22/2019 Donald Gilbert 1955/07/08 [id number]   PRE-OPERATIVE DIAGNOSIS:     Obesity BMI 40   Vitamin D deficiency   Essential hypertension   Mixed hyperlipidemia   OSA on CPAP   Prediabetes  POST-OPERATIVE DIAGNOSIS:  Same + hiatal hernia  PROCEDURE:  Procedure(s): LAPAROSCOPIC SLEEVE GASTRECTOMY WITH HIATAL HERNIA REPAIR UPPER GI ENDOSCOPY  SURGEON:  Surgeon(s): Atilano InaEric M Jermany Rimel, MD FACS FASMBS  ASSISTANTS: Luretha MurphyMatthew Martin MD FACS  ANESTHESIA:   general  DRAINS: none   BOUGIE: 40 fr ViSiGi  LOCAL MEDICATIONS USED:   Exparel  EBL: minimal  SPECIMEN:  Source of Specimen:  Greater curvature of stomach  DISPOSITION OF SPECIMEN:  PATHOLOGY  COUNTS:  YES  INDICATION FOR PROCEDURE: This is a very pleasant 63 y.o.-year-old morbidly obese male who has had unsuccessful attempts for sustained weight loss. The patient presents today for a planned laparoscopic sleeve gastrectomy with upper endoscopy. We have discussed the risk and benefits of the procedure extensively preoperatively. Please see my separate notes.  PROCEDURE: After obtaining informed consent and receiving 5000 units of subcutaneous heparin, the patient was [facility] and placed supine on the operating room table. General endotracheal anesthesia was established. Sequential compression devices were placed. A orogastric tube was placed. The patient's abdomen was prepped and draped in the usual standard surgical fashion. The patient received preoperative IV antibiotic. A surgical timeout was performed. ERAS protocol used.   Access to the abdomen was achieved using a 5 mm 0 laparoscope thru a 5 mm trocar In the left upper Quadrant 2 fingerbreadths below the left subcostal margin using the Optiview technique. Pneumoperitoneum was smoothly established up to 15 mm of mercury. The laparoscope was advanced and the abdominal cavity was surveilled. The patient was then placed in reverse  Trendelenburg.   A 5 mm trocar was placed slightly above and to the left of the umbilicus under direct visualization.  The [facility] liver retractor was placed under the left lobe of the liver through a 5 mm trocar incision site in the subxiphoid position. A 5 mm trocar was placed in the lateral right upper quadrant along with a 15 mm trocar in the mid right abdomen. A final 5 mm trocar was placed in the lateral LUQ.  All under direct visualization after exparel had been infiltrated in bilateral lateral upper abdominal walls as a TAP block.  He had some omental adhesions around the umbilicus from her prior umbilical hernia repair.   A round piece of mesh was visible  The stomach was inspected. It was completely decompressed and the orogastric tube was removed. There was a fair amount of fat around the GE junction. His preop ugi showed no hiatal hernia.   There was no anterior dimple that was obviously visible. The calibration tube was placed in the oropharynx and guided down into the stomach by the CRNA. 10 mL of air was insufflated into the calibration balloon. The calibration tubing was then gently pulled back by the CRNA and it slid past the GE junction. At this point the calibration tubing was desufflated and pulled back into the esophagus. This confirmed my suspicion of a clinically significant hiatal hernia. The gastrohepatic ligament was incised with harmonic scalpel. The right crus was identified. We identified the crossing fat along the right crus. The adipose tissue just above this area was incised with harmonic scalpel. I then bluntly dissected out this area and identified the left crus. There was evidence of a  hiatal hernia. I then mobilized the esophagus. The left and right crus were further mobilized with blunt dissection. I was then able to reapproximate the left and right crus with 0 Ethibond using an Endostitch suture device and securing it with a titanium tyknot. We then had the CRNA  readvanced the calibration tubing back into the stomach. 10 mL of air was insufflated into the calibration tube balloon. The calibration tube was then gently pulled back and there was resistance at the GE junction. The tube did not slide back up into the esophagus. At this point the calibration tubing was deflated and removed from the patient's body.   We identified the pylorus and measured 6 cm proximal to the pylorus and identified an area of where we would start taking down the short gastric vessels. Harmonic scalpel was used to take down the short gastric vessels along the greater curvature of the stomach. We were able to enter the lesser sac. We continued to march along the greater curvature of the stomach taking down the short gastrics. As we approached the gastrosplenic ligament we took care in this area not to injure the spleen. We were able to take down the entire gastrosplenic ligament. We then mobilized the fundus away from the left crus of diaphragm. There were some posterior gastric avascular attachments that were taken down. This left the stomach completely mobilized. No vessels had been taken down along the lesser curvature of the stomach.  We then reidentified the pylorus. A 40Fr ViSiGi was then placed in the oropharynx and advanced down into the stomach and placed in the distal antrum and positioned along the lesser curvature. It was placed under suction which secured the 40Fr ViSiGi in place along the lesser curve. Then using the Ethicon echelon 60 mm stapler with a green load with Seamguard, I placed a stapler along the antrum approximately 5 cm from the pylorus. The stapler was angled so that there is ample room at the angularis incisura. I then fired the first staple load after inspecting it posteriorly to ensure adequate space both anteriorly and posteriorly. At this point I still was not completely past the angularis so with a gold load with Seamguard, I placed the stapler in position  just inside the prior stapleline. We then rotated the stomach to insure that there was adequate anteriorly as well as posteriorly. The stapler was then fired.  At this point I started using 60 mm blue load staple cartridges with Seamguard. The echelon stapler was then repositioned with a 60 mm blue load with Seamguard and we continued to march up along the [place]. My assistant was holding traction along the greater curvature stomach along the cauterized short gastric vessels ensuring that the stomach was symmetrically retracted. Prior to each firing of the staple, we rotated the stomach to ensure that there is adequate stomach left.  As we approached the fundus, I used 60 mm blue cartridge with Seamguard aiming  lateral to the GE junction after mobilizing some of the esophageal fat pad.  The sleeve was inspected. There is no evidence of cork screw. The staple line appeared hemostatic. The CRNA inflated the ViSiGi to the green zone and the upper abdomen was flooded with saline. There were no bubbles. The sleeve was decompressed and the ViSiGi removed. My assistant scrubbed out and performed an upper endoscopy. The sleeve easily distended with air and the scope was easily advanced to the pylorus. There is no evidence of internal bleeding or cork screwing. There was  no narrowing at the angularis. There is no evidence of bubbles. Please see his operative note for further details. The gastric sleeve was decompressed and the endoscope was removed.  The greater curvature the stomach was grasped with a laparoscopic grasper and removed from the 15 mm trocar site.  The liver retractor was removed. I then closed the 15 mm trocar site with 1 interrupted 0 Vicryl sutures through the fascia using the endoclose. The closure was viewed laparoscopically and it was airtight. Remaining Exparel was then infiltrated in the preperitoneal spaces around the trocar sites. Pneumoperitoneum was released. All trocar sites were closed with a  4-0 Monocryl in a subcuticular fashion followed by the application of benzoin, steri-strips, and bandaids. The patient was extubated and taken to the recovery room in stable condition. All needle, instrument, and sponge counts were correct x2. There are no immediate complications  (1) 60 mm green with Seamguard (1) 60 mm gold with seamguard (4) 60 mm blue with  seamguard  PLAN OF CARE: Admit to inpatient   PATIENT DISPOSITION:  PACU - hemodynamically stable.   Delay start of Pharmacological VTE agent (>24hrs) due to surgical blood loss or risk of bleeding:  no  Mary Sella. Andrey Campanile, MD, FACS FASMBS General, Bariatric, & Minimally Invasive Surgery [facility], [place]

## 2019-01-23 ENCOUNTER — Encounter (HOSPITAL_COMMUNITY): Payer: Self-pay | Admitting: General Surgery

## 2019-01-23 DIAGNOSIS — M19041 Primary osteoarthritis, right hand: Secondary | ICD-10-CM | POA: Diagnosis present

## 2019-01-23 DIAGNOSIS — K5900 Constipation, unspecified: Secondary | ICD-10-CM | POA: Diagnosis present

## 2019-01-23 DIAGNOSIS — E559 Vitamin D deficiency, unspecified: Secondary | ICD-10-CM | POA: Diagnosis present

## 2019-01-23 DIAGNOSIS — E782 Mixed hyperlipidemia: Secondary | ICD-10-CM | POA: Diagnosis present

## 2019-01-23 DIAGNOSIS — Z6841 Body Mass Index (BMI) 40.0 and over, adult: Secondary | ICD-10-CM | POA: Diagnosis not present

## 2019-01-23 DIAGNOSIS — Z8261 Family history of arthritis: Secondary | ICD-10-CM | POA: Diagnosis not present

## 2019-01-23 DIAGNOSIS — Z8249 Family history of ischemic heart disease and other diseases of the circulatory system: Secondary | ICD-10-CM | POA: Diagnosis not present

## 2019-01-23 DIAGNOSIS — Z86010 Personal history of colonic polyps: Secondary | ICD-10-CM | POA: Diagnosis not present

## 2019-01-23 DIAGNOSIS — K660 Peritoneal adhesions (postprocedural) (postinfection): Secondary | ICD-10-CM | POA: Diagnosis present

## 2019-01-23 DIAGNOSIS — I10 Essential (primary) hypertension: Secondary | ICD-10-CM | POA: Diagnosis present

## 2019-01-23 DIAGNOSIS — K449 Diaphragmatic hernia without obstruction or gangrene: Secondary | ICD-10-CM | POA: Diagnosis present

## 2019-01-23 DIAGNOSIS — Z87442 Personal history of urinary calculi: Secondary | ICD-10-CM | POA: Diagnosis not present

## 2019-01-23 DIAGNOSIS — R7303 Prediabetes: Secondary | ICD-10-CM | POA: Diagnosis present

## 2019-01-23 DIAGNOSIS — G4733 Obstructive sleep apnea (adult) (pediatric): Secondary | ICD-10-CM | POA: Diagnosis present

## 2019-01-23 LAB — CBC WITH DIFFERENTIAL/PLATELET
Abs Immature Granulocytes: 0.05 10*3/uL (ref 0.00–0.07)
Basophils Absolute: 0 10*3/uL (ref 0.0–0.1)
Basophils Relative: 0 %
Eosinophils Absolute: 0 10*3/uL (ref 0.0–0.5)
Eosinophils Relative: 0 %
HCT: 42.7 % (ref 39.0–52.0)
Hemoglobin: 14.2 g/dL (ref 13.0–17.0)
Immature Granulocytes: 0 %
Lymphocytes Relative: 5 %
Lymphs Abs: 0.7 10*3/uL (ref 0.7–4.0)
MCH: 30.6 pg (ref 26.0–34.0)
MCHC: 33.3 g/dL (ref 30.0–36.0)
MCV: 92 fL (ref 80.0–100.0)
Monocytes Absolute: 0.4 10*3/uL (ref 0.1–1.0)
Monocytes Relative: 3 %
Neutro Abs: 11 10*3/uL — ABNORMAL HIGH (ref 1.7–7.7)
Neutrophils Relative %: 92 %
Platelets: 198 10*3/uL (ref 150–400)
RBC: 4.64 MIL/uL (ref 4.22–5.81)
RDW: 13.3 % (ref 11.5–15.5)
WBC: 12 10*3/uL — ABNORMAL HIGH (ref 4.0–10.5)
nRBC: 0 % (ref 0.0–0.2)

## 2019-01-23 LAB — COMPREHENSIVE METABOLIC PANEL
ALT: 67 U/L — ABNORMAL HIGH (ref 0–44)
AST: 43 U/L — ABNORMAL HIGH (ref 15–41)
Albumin: 3.8 g/dL (ref 3.5–5.0)
Alkaline Phosphatase: 60 U/L (ref 38–126)
Anion gap: 10 (ref 5–15)
BUN: 24 mg/dL — ABNORMAL HIGH (ref 6–20)
CO2: 26 mmol/L (ref 22–32)
Calcium: 8.7 mg/dL — ABNORMAL LOW (ref 8.9–10.3)
Chloride: 101 mmol/L (ref 98–111)
Creatinine, Ser: 1.06 mg/dL (ref 0.61–1.24)
GFR calc Af Amer: >60 mL/min (ref >60)
GFR calc non Af Amer: >60 mL/min (ref >60)
Glucose, Bld: 148 mg/dL — ABNORMAL HIGH (ref 70–99)
Potassium: 3.8 mmol/L (ref 3.5–5.1)
Sodium: 137 mmol/L (ref 135–145)
Total Bilirubin: 1 mg/dL (ref 0.3–1.2)
Total Protein: 6.5 g/dL (ref 6.5–8.1)

## 2019-01-23 LAB — GLUCOSE, CAPILLARY
Glucose-Capillary: 105 mg/dL — ABNORMAL HIGH (ref 70–99)
Glucose-Capillary: 122 mg/dL — ABNORMAL HIGH (ref 70–99)
Glucose-Capillary: 140 mg/dL — ABNORMAL HIGH (ref 70–99)
Glucose-Capillary: 145 mg/dL — ABNORMAL HIGH (ref 70–99)

## 2019-01-23 MED ORDER — GABAPENTIN 100 MG PO CAPS: 200.0000 mg | ORAL_CAPSULE | Freq: Two times a day (BID) | ORAL | 0 refills | Status: DC

## 2019-01-23 MED ORDER — PANTOPRAZOLE SODIUM 40 MG PO TBEC: 40.0000 mg | DELAYED_RELEASE_TABLET | Freq: Every day | ORAL | 0 refills | Status: DC

## 2019-01-23 MED ORDER — ACETAMINOPHEN 500 MG PO TABS: 1000.0000 mg | ORAL_TABLET | Freq: Three times a day (TID) | ORAL | 0 refills | Status: AC

## 2019-01-23 MED ORDER — TRAMADOL HCL 50 MG PO TABS: 50.0000 mg | ORAL_TABLET | Freq: Four times a day (QID) | ORAL | 0 refills | Status: DC | PRN

## 2019-01-23 MED ORDER — ONDANSETRON 4 MG PO TBDP: 4.0000 mg | ORAL_TABLET | Freq: Four times a day (QID) | ORAL | 0 refills | Status: DC | PRN

## 2019-01-23 MED ORDER — ENOXAPARIN SODIUM 40 MG/0.4ML ~~LOC~~ SOLN: 40.0000 mg | Freq: Two times a day (BID) | SUBCUTANEOUS | 0 refills | Status: DC

## 2019-01-23 NOTE — Progress Notes (Signed)
Patient ID: Donald Gilbert, male   DOB: 01-Dec-1955, 63 y.o.   MRN: [id number]   Progress Note: Metabolic and Bariatric Surgery Service   Chief Complaint/Subjective: Feels good Minimal epigastric pain with oral intake Walking a lot  Objective: Vital signs in last 24 hours: Temp:  [97.6 F (36.4 C)-98.6 F (37 C)] 98.6 F (37 C) (11/10 0606) Pulse Rate:  [55-79] 55 (11/10 0606) Resp:  [12-18] 18 (11/10 0606) BP: (125-165)/(70-86) 125/73 (11/10 0606) SpO2:  [95 %-100 %] 96 % (11/10 0606) Weight:  [122.1 kg] 122.1 kg (11/09 1101) Last BM Date: 01/22/19  Intake/Output from previous day: 11/09 0701 - 11/10 0700 In: 3872.9 [P.O.:1080; I.V.:2692.9; IV Piggyback:100] Out: 1800 [Urine:1800] Intake/Output this shift: No intake/output data recorded.  Lungs: cta  Cardiovascular: reg  Abd: soft, mild expected ttp, incisions ok  Extremities: no edema  Neuro: alert, nad  Lab Results: CBC  Recent Labs    01/22/19 1548 01/23/19 0258  WBC  --  12.0*  HGB 14.7 14.2  HCT 43.6 42.7  PLT  --  198   BMET Recent Labs    01/23/19 0258  NA 137  K 3.8  CL 101  CO2 26  GLUCOSE 148*  BUN 24*  CREATININE 1.06  CALCIUM 8.7*   PT/INR No results for input(s): LABPROT, INR in the last 72 hours. ABG No results for input(s): PHART, HCO3 in the last 72 hours.  Invalid input(s): PCO2, PO2  Studies/Results:  Anti-infectives: Anti-infectives (From admission, onward)   Start     Dose/Rate Route Frequency Ordered Stop   01/22/19 1115  cefoTEtan (CEFOTAN) 2 g in sodium chloride 0.9 % 100 mL IVPB     2 g 200 mL/hr over 30 Minutes Intravenous On call to O.R. 01/22/19 1100 01/23/19 0003      Medications: Scheduled Meds: . acetaminophen  1,000 mg Oral Q8H   Or  . acetaminophen (TYLENOL) oral liquid 160 mg/5 mL  1,000 mg Oral Q8H  . allopurinol  300 mg Oral QPM  . carvedilol  12.5 mg Oral BID WC  . enoxaparin (LOVENOX) injection  30 mg Subcutaneous Q12H  . gabapentin  200 mg  Oral Q12H  . hydrochlorothiazide  25 mg Oral Daily  . insulin aspart  0-20 Units Subcutaneous Q4H  . losartan  100 mg Oral Daily  . pantoprazole (PROTONIX) IV  40 mg Intravenous QHS  . Ensure Max Protein  2 oz Oral Q2H   Continuous Infusions: . 0.45 % NaCl with KCl 20 mEq / L 125 mL/hr at 01/23/19 0309   PRN Meds:.diphenhydrAMINE, enalaprilat, morphine injection, ondansetron (ZOFRAN) IV, oxyCODONE, promethazine, simethicone  Assessment/Plan: Patient Active Problem List   Diagnosis Date Noted  . Prediabetes 01/22/2019  . 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 Former smoker 01/19/2019  . Difficult intubation   . OSA on CPAP 05/30/2018  . Vitamin D deficiency 04/03/2017  . Obesity 04/03/2017  . Essential hypertension 04/03/2017  . Mixed hyperlipidemia 04/03/2017  . Recurrent kidney stones 04/03/2017  . Arthralgia of multiple joints 04/03/2017  . Atopic dermatitis 04/03/2017  . Dysthymia 04/03/2017   s/p Procedure(s): LAPAROSCOPIC GASTRIC SLEEVE RESECTION AND HIATAL HERNIA REPAIR, Upper Endo, Eras Pathway 01/22/2019  Doing well Discussed intraop findings Spoke with wife on phone Discussed dc instructions Discussed 2 weeks of lovenox post discharge  Discussed monitoring BP  Disposition:  LOS: 0 days  The patient should be discharged from the hospital today  [name], MD [phone] [facility]

## 2019-01-23 NOTE — TOC Benefit Eligibility Note (Signed)
Transition of Care [facility]) Benefit Eligibility Note    Patient Details  Name: Donald Gilbert MRN: [id number] Date of Birth: 1955/07/25   Medication/Dose: Lovenox 40 mg/0.49m 2xday for 14days  Covered?: Yes  Tier: 3 Drug  Prescription [facility]age [facility]/Phone Number:: [facility] [phone] Co-Pay: $10.00  Prior Approval: No  Deductible: Met       FKerin SalenPhone Number: 01/23/2019, 12:54 PM

## 2019-01-23 NOTE — Progress Notes (Addendum)
Patient alert and oriented, pain is controlled. Patient is tolerating fluids, advanced to protein shake today, patient is tolerating well.  Reviewed Gastric sleeve discharge instructions with patient and patient is able to articulate understanding.  Provided information on BELT program, Support Group, lovenox education and [facility] outpatient pharmacy. All questions answered, will continue to monitor.  Total fluid intake1200 Per dehydration protocol call back one week post op

## 2019-01-23 NOTE — Discharge Instructions (Signed)
Enoxaparin injection What is this medicine? ENOXAPARIN (ee nox a PA rin) is used after knee, hip, or abdominal surgeries to prevent blood clotting. It is also used to treat existing blood clots in the lungs or in the veins. This medicine may be used for other purposes; ask your health care provider or pharmacist if you have questions. COMMON BRAND NAME(S): Lovenox What should I tell my health care provider before I take this medicine? They need to know if you have any of these conditions:  bleeding disorders, hemorrhage, or hemophilia  infection of the heart or heart valves  kidney or liver disease  previous stroke  prosthetic heart valve  recent surgery or delivery of a baby  ulcer in the stomach or intestine, diverticulitis, or other bowel disease  an unusual or allergic reaction to enoxaparin, heparin, pork or pork products, other medicines, foods, dyes, or preservatives  pregnant or trying to get pregnant  breast-feeding How should I use this medicine? This medicine is for injection under the skin. It is usually given by a health-care professional. You or a family member may be trained on how to give the injections. If you are to give yourself injections, make sure you understand how to use the syringe, measure the dose if necessary, and give the injection. To avoid bruising, do not rub the site where this medicine has been injected. Do not take your medicine more often than directed. Do not stop taking except on the advice of your doctor or health care professional. Make sure you receive a puncture-resistant container to dispose of the needles and syringes once you have finished with them. Do not reuse these items. Return the container to your doctor or health care professional for proper disposal. Talk to your pediatrician regarding the use of this medicine in children. Special care may be needed. Overdosage: If you think you have taken too much of this medicine contact a poison  control center or emergency room at once. NOTE: This medicine is only for you. Do not share this medicine with others. What if I miss a dose? If you miss a dose, take it as soon as you can. If it is almost time for your next dose, take only that dose. Do not take double or extra doses. What may interact with this medicine?  aspirin and aspirin-like medicines  certain medicines that treat or prevent blood clots  dipyridamole  NSAIDs, medicines for pain and inflammation, like ibuprofen or naproxen This list may not describe all possible interactions. Give your health care provider a list of all the medicines, herbs, non-prescription drugs, or dietary supplements you use. Also tell them if you smoke, drink alcohol, or use illegal drugs. Some items may interact with your medicine. What should I watch for while using this medicine? Visit your healthcare professional for regular checks on your progress. You may need blood work done while you are taking this medicine. Your condition will be monitored carefully while you are receiving this medicine. It is important not to miss any appointments. If you are going to need surgery or other procedure, tell your healthcare professional that you are using this medicine. Using this medicine for a long time may weaken your bones and increase the risk of bone fractures. Avoid sports and activities that might cause injury while you are using this medicine. Severe falls or injuries can cause unseen bleeding. Be careful when using sharp tools or knives. Consider using an electric razor. Take special care brushing or flossing your   teeth. Report any injuries, bruising, or red spots on the skin to your healthcare professional. Wear a medical ID bracelet or chain. Carry a card that describes your disease and details of your medicine and dosage times. What side effects may I notice from receiving this medicine? Side effects that you should report to your doctor or health  care professional as soon as possible:  allergic reactions like skin rash, itching or hives, swelling of the face, lips, or tongue  bone pain  signs and symptoms of bleeding such as bloody or black, tarry stools; red or dark-brown urine; spitting up blood or brown material that looks like coffee grounds; red spots on the skin; unusual bruising or bleeding from the eye, gums, or nose  signs and symptoms of a blood clot such as chest pain; shortness of breath; pain, swelling, or warmth in the leg  signs and symptoms of a stroke such as changes in vision; confusion; trouble speaking or understanding; severe headaches; sudden numbness or weakness of the face, arm or leg; trouble walking; dizziness; loss of coordination Side effects that usually do not require medical attention (report to your doctor or health care professional if they continue or are bothersome):  hair loss  pain, redness, or irritation at site where injected This list may not describe all possible side effects. Call your doctor for medical advice about side effects. You may report side effects to FDA at 1-800-FDA-1088. Where should I keep my medicine? Keep out of the reach of children. Store at room temperature between 15 and 30 degrees C (59 and 86 degrees F). Do not freeze. If your injections have been specially prepared, you may need to store them in the refrigerator. Ask your pharmacist. Throw away any unused medicine after the expiration date. NOTE: This sheet is a summary. It may not cover all possible information. If you have questions about this medicine, talk to your doctor, pharmacist, or health care provider.  2020 Elsevier/Gold Standard (2017-02-24 11:25:34)     GASTRIC BYPASS/SLEEVE  Home Care Instructions   These instructions are to help you care for yourself when you go home.  Call: If you have any problems. . Call [phone] and ask for the surgeon on call . If you need immediate help, come to the ER  at [place].  . Tell the ER staff that you are a new post-op gastric bypass or gastric sleeve patient   Signs and symptoms to report: . Severe vomiting or nausea o If you cannot keep down clear liquids for longer than 1 day, call your surgeon  . Abdominal pain that does not get better after taking your pain medication . Fever over 100.4 F with chills . Heart beating over 100 beats a minute . Shortness of breath at rest . Chest pain .  Redness, swelling, drainage, or foul odor at incision (surgical) sites .  If your incisions open or pull apart . Swelling or pain in calf (lower leg) . Diarrhea (Loose bowel movements that happen often), frequent watery, uncontrolled bowel movements . Constipation, (no bowel movements for 3 days) if this happens: Pick one o Milk of Magnesia, 2 tablespoons by mouth, 3 times a day for 2 days if needed o Stop taking Milk of Magnesia once you have a bowel movement o Call your doctor if constipation continues Or o Miralax  (instead of Milk of Magnesia) following the label instructions o Stop taking Miralax once you have a bowel movement o Call your doctor if constipation   continues . Anything you think is not normal   Normal side effects after surgery: . Unable to sleep at night or unable to focus . Irritability or moody . Being tearful (crying) or depressed These are common complaints, possibly related to your anesthesia medications that put you to sleep, stress of surgery, and change in lifestyle.  This usually goes away a few weeks after surgery.  If these feelings continue, call your primary care doctor.   Wound Care: You may have surgical glue, steri-strips, or staples over your incisions after surgery . Surgical glue:  Looks like a clear film over your incisions and will wear off a little at a time . Steri-strips: Strips of tape over your incisions. You may notice a yellowish color on the skin under the steri-strips. This is used to make the    steri-strips stick better. Do not pull the steri-strips off - let them fall off . Staples: Staples may be removed before you leave the hospital o If you go home with staples, call [facility], [phone] at for an appointment with your surgeon's nurse to have staples removed 10 days after surgery. . Showering: You may shower two (2) days after your surgery unless your surgeon tells you differently o Wash gently around incisions with warm soapy water, rinse well, and gently pat dry  o No tub baths until staples are removed, steri-strips fall off or glue is gone.    Medications: . Medications should be liquid or crushed if larger than the size of a dime . Extended release pills (medication that release a little bit at a time through the day) should NOT be crushed or cut. (examples include XL, ER, DR, SR) . Depending on the size and number of medications you take, you may need to space (take a few throughout the day)/change the time you take your medications so that you do not over-fill your pouch (smaller stomach) . Make sure you follow-up with your primary care doctor to make medication changes needed during rapid weight loss and life-style changes . If you have diabetes, follow up with the doctor that orders your diabetes medication(s) within one week after surgery and check your blood sugar regularly. . Do not drive while taking prescription pain medication  . It is ok to take Tylenol by the bottle instructions with your pain medicine or instead of your pain medicine as needed.  DO NOT TAKE NSAIDS (EXAMPLES OF NSAIDS:  IBUPROFREN/ NAPROXEN)  Diet:                    First 2 Weeks  You will see the dietician t about two (2) weeks after your surgery. The dietician will increase the types of foods you can eat if you are handling liquids well: . If you have severe vomiting or nausea and cannot keep down clear liquids lasting longer than 1 day, call your surgeon @ ([phone]) Protein  Shake . Drink at least 2 ounces of shake 5-6 times per day . Each serving of protein shakes (usually 8 - 12 ounces) should have: o 15 grams of protein  o And no more than 5 grams of carbohydrate  . Goal for protein each day: o Men = 80 grams per day o Women = 60 grams per day . Protein powder may be added to fluids such as non-fat milk or Lactaid milk or unsweetened Soy/Almond milk (limit to 35 grams added protein powder per serving)  Hydration . Slowly increase the   amount of water and other clear liquids as tolerated (See Acceptable Fluids) . Slowly increase the amount of protein shake as tolerated  .  Sip fluids slowly and throughout the day.  Do not use straws. . May use sugar substitutes in small amounts (no more than 6 - 8 packets per day; i.e. Splenda)  Fluid Goal . The first goal is to drink at least 8 ounces of protein shake/drink per day (or as directed by the nutritionist); some examples of protein shakes are Syntrax Nectar, Adkins Advantage, EAS Edge HP, and Unjury. See handout from pre-op Bariatric Education Class: o Slowly increase the amount of protein shake you drink as tolerated o You may find it easier to slowly sip shakes throughout the day o It is important to get your proteins in first . Your fluid goal is to drink 64 - 100 ounces of fluid daily o It may take a few weeks to build up to this . 32 oz (or more) should be clear liquids  And  . 32 oz (or more) should be full liquids (see below for examples) . Liquids should not contain sugar, caffeine, or carbonation  Clear Liquids: . Water or Sugar-free flavored water (i.e. Fruit H2O, Propel) . Decaffeinated coffee or tea (sugar-free) . Crystal Lite, Wyler's Lite, Minute Maid Lite . Sugar-free Jell-O . Bouillon or broth . Sugar-free Popsicle:   *Less than 20 calories each; Limit 1 per day  Full Liquids: Protein Shakes/Drinks + 2 choices per day of other full liquids . Full liquids must be: o No More Than 15  grams of Carbs per serving  o No More Than 3 grams of Fat per serving . Strained low-fat cream soup (except Cream of Potato or Tomato) . Non-Fat milk . Fat-free Lactaid Milk . Unsweetened Soy Or Unsweetened Almond Milk . Low Sugar yogurt (Dannon Lite & Fit, Greek yogurt; Oikos Triple Zero; Chobani Simply 100; Yoplait 100 calorie Greek - No Fruit on the Bottom)    Vitamins and Minerals . Start 1 day after surgery unless otherwise directed by your surgeon . 2 Chewable Bariatric Specific Multivitamin / Multimineral Supplement with iron (Example: Bariatric Advantage Multi EA) . Chewable Calcium with Vitamin D-3 (Example: 3 Chewable Calcium Plus 600 with Vitamin D-3) o Take 500 mg three (3) times a day for a total of 1500 mg each day o Do not take all 3 doses of calcium at one time as it may cause constipation, and you can only absorb 500 mg  at a time  o Do not mix multivitamins containing iron with calcium supplements; take 2 hours apart . Menstruating women and those with a history of anemia (a blood disease that causes weakness) may need extra iron o Talk with your doctor to see if you need more iron . Do not stop taking or change any vitamins or minerals until you talk to your dietitian or surgeon . Your Dietitian and/or surgeon must approve all vitamin and mineral supplements   Activity and Exercise: Limit your physical activity as instructed by your doctor.  It is important to continue walking at home.  During this time, use these guidelines: . Do not lift anything greater than ten (10) pounds for at least two (2) weeks . Do not go back to work or drive until your surgeon says you can . You may have sex when you feel comfortable  o It is VERY important for male patients to use a reliable birth control method; fertility often increases after   surgery  o All hormonal birth control will be ineffective for 30 days after surgery due to medications given during surgery a barrier method must be  used. o Do not get pregnant for at least 18 months . Start exercising as soon as your doctor tells you that you can o Make sure your doctor approves any physical activity . Start with a simple walking program . Walk 5-15 minutes each day, 7 days per week.  . Slowly increase until you are walking 30-45 minutes per day Consider joining our BELT program. [phone] or email [email or web address]   Special Instructions Things to remember: . Use your CPAP when sleeping if this applies to you  . [facility] has two free Bariatric Surgery Support Groups that meet monthly o The 3rd Thursday of each month, 6 pm, [facility]  o The 2nd Friday of each month, 11:45 am in the private dining room in the basement of [place] . It is very important to keep all follow up appointments with your surgeon, dietitian, primary care physician, and behavioral health practitioner . Routine follow up schedule with your surgeon include appointments at 2-3 weeks, 6-8 weeks, 6 months, and 1 year at a minimum.  Your surgeon may request to see you more often.   o After the first year, please follow up with your bariatric surgeon and dietitian at least once a year in order to maintain best weight loss results [facility]: [phone] [facility]: [phone] Bariatric Nurse Coordinator: [phone]      Reviewed and Endorsed  by [facility] Patient Education Committee, June, 2016 Edits Approved: Aug, 2018    

## 2019-01-24 LAB — SURGICAL PATHOLOGY

## 2019-01-24 NOTE — Discharge Summary (Signed)
Physician Discharge Summary  Donald Gilbert LZJ:[id number] DOB: [birth year] DOA: 01/22/2019  PCP: Vivi Barrack, MD  Admit date: 01/22/2019 Discharge date: 01/23/2019  Recommendations for Outpatient Follow-up:   Follow-up Information    [name], MD Follow up on 02/16/2019.   Specialty: General Surgery Why: at 330 pm Contact information: [address] [place] [address] [phone]        Carlena Hurl, PA-C. Go on 03/15/2019.   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 Please see operative note. Preoperatively the patient was given 5000 units of subcutaneous heparin for DVT prophylaxis. Postoperative prophylactic Lovenox dosing was started on the evening of postoperative day 0. ERAS protocol was used. On the evening of postoperative day 0, the patient was started on water and ice chips. On postoperative day 1 the patient had no fever or tachycardia and was tolerating water in their diet was gradually advanced throughout the day. The patient was ambulating without difficulty. Their vital signs are stable without fever or tachycardia. Their hemoglobin had remained stable. The patient was maintained on their home settings for CPAP therapy. The patient had received discharge  instructions and counseling. They were deemed stable for discharge and had met discharge criteria  His post discharge VTE risk was elevated so he was sent out on 2 weeks of bid lovenox prophylaxis. He was instructed on use.   Discharge Instructions  Discharge Instructions    Ambulate hourly while awake   Complete by: As directed    Call MD for:  difficulty breathing, headache or visual disturbances   Complete by: As directed    Call MD for:  persistant dizziness or light-headedness   Complete by: As directed    Call MD for:  persistant nausea and vomiting   Complete by: As directed    Call MD for:  redness, tenderness, or signs of infection (pain, swelling, redness, odor or green/yellow discharge around incision site)   Complete by: As directed    Call MD for:  severe uncontrolled pain   Complete by: As directed    Call MD for:  temperature >101 F   Complete by: As directed    Diet bariatric full liquid   Complete by: As directed    Discharge instructions   Complete by: As directed    See bariatric discharge instructions Monitor blood pressure - if blood pressure is running low (systolic, top number <[id number]) - may need to decrease BP dose - please contact primary care doctor   Incentive spirometry   Complete by: As directed    Perform hourly while awake     Allergies as of 01/23/2019   No Known Allergies     Medication List    STOP taking these medications   aspirin EC 81 MG tablet   diclofenac 75  MG EC tablet Commonly known as: VOLTAREN     TAKE these medications   acetaminophen 500 MG tablet Commonly known as: TYLENOL Take 2 tablets (1,000 mg total) by mouth every 8 (eight) hours for 5 days.   allopurinol 300 MG tablet Commonly known as: ZYLOPRIM TAKE 1 TABLET(300 MG) BY MOUTH DAILY What changed: See the new instructions.   buPROPion 150 MG 24 hr tablet Commonly known as: WELLBUTRIN XL TAKE 1 TABLET(150 MG) BY MOUTH DAILY   carvedilol 12.5 MG  tablet Commonly known as: COREG Take 1 tablet (12.5 mg total) by mouth 2 (two) times daily with a meal.   enoxaparin 40 MG/0.4ML injection Commonly known as: LOVENOX Inject 0.4 mLs (40 mg total) into the skin every 12 (twelve) hours for 14 days.   gabapentin 100 MG capsule Commonly known as: NEURONTIN Take 2 capsules (200 mg total) by mouth every 12 (twelve) hours.   losartan-hydrochlorothiazide 100-25 MG tablet Commonly known as: HYZAAR TAKE 1 TABLET BY MOUTH DAILY Notes to patient: Monitor Blood Pressure Daily and keep a log for primary care physician.  You may need to make changes to your medications with rapid weight loss.     multivitamin with minerals Tabs tablet Take 1 tablet by mouth daily.   ondansetron 4 MG disintegrating tablet Commonly known as: ZOFRAN-ODT Take 1 tablet (4 mg total) by mouth every 6 (six) hours as needed for nausea or vomiting.   oxymetazoline 0.05 % nasal spray Commonly known as: AFRIN Place 1 spray into both nostrils at bedtime as needed for congestion.   pantoprazole 40 MG tablet Commonly known as: PROTONIX Take 1 tablet (40 mg total) by mouth daily.   simvastatin 40 MG tablet Commonly known as: ZOCOR TAKE 1 TABLET(40 MG) BY MOUTH DAILY What changed: See the new instructions.   traMADol 50 MG tablet Commonly known as: ULTRAM Take 1 tablet (50 mg total) by mouth every 6 (six) hours as needed (pain).   Vitamin D (Ergocalciferol) 1.25 MG (50000 UT) Caps capsule Commonly known as: DRISDOL Take 50,000 Units by mouth every Friday.      Follow-up Information    [name], MD Follow up on 02/16/2019.   Specialty: General Surgery Why: at 330 pm Contact information: [address] [place] [address] [phone]        Carlena Hurl, PA-C. Go on 03/15/2019.   Specialty: General Surgery Why: at 1030 Contact information: [place] [address] [phone]            The results of  significant diagnostics from this hospitalization (including imaging, microbiology, ancillary and laboratory) are listed below for reference.    Significant Diagnostic Studies: No results found.  Labs: Basic Metabolic Panel: Recent Labs  Lab 01/19/19 0905 01/23/19 0258  NA 138 137  K 3.6 3.8  CL 99 101  CO2 29 26  GLUCOSE 99 148*  BUN 20 24*  CREATININE 1.07 1.06  CALCIUM 10.5* 8.7*   Liver Function Tests: Recent Labs  Lab 01/19/19 0905 01/23/19 0258  AST 24 43*  ALT 34 67*  ALKPHOS 67 60  BILITOT 0.8 1.0  PROT 6.9 6.5  ALBUMIN 4.3 3.8    CBC: Recent Labs  Lab 01/19/19 0905 01/22/19 1548 01/23/19 0258  WBC 7.2  --  12.0*  NEUTROABS 5.1  --  11.0*  HGB 15.2 14.7 14.2  HCT 45.3 43.6 42.7  MCV 90.6  --  92.0  PLT 211  --  198    CBG: Recent Labs  Lab 01/22/19 2020 01/23/19 0009 01/23/19 0353 01/23/19 0732 01/23/19 1137  GLUCAP 132* 145* 140* 122* 105*    Principal Problem:   Obesity Active Problems:   Vitamin D deficiency   Essential hypertension   Mixed hyperlipidemia   OSA on CPAP   Prediabetes   S/P laparoscopic sleeve gastrectomy   Time coordinating discharge: 15 min  Signed:  Gayland Curry, MD [facility], [place] 01/24/2019, 8:06 AM

## 2019-01-26 NOTE — Addendum Note (Signed)
Addendum  created 01/26/19 1548 by Sharlette Dense, CRNA   Charge Capture section accepted, Visit diagnoses modified

## 2019-01-29 ENCOUNTER — Telehealth (HOSPITAL_COMMUNITY): Payer: Self-pay

## 2019-01-29 NOTE — Telephone Encounter (Addendum)
Patient called to discuss post bariatric surgery follow up questions. Voice mail left for return call back from patient.  See below:  RETURNED CALL 01/29/2019 AT 1259  1.  Tell me about your pain and pain management?denies, doesn't feel like he had surgery  2.  Let's talk about fluid intake.  How much total fluid are you taking in?80 ounces  3.  How much protein have you taken in the last 2 days?90 grams  4.  Have you had nausea?  Tell me about when have experienced nausea and what you did to help?denies  5.  Has the frequency or color changed with your urine?urine light in color   6.  Tell me what your incisions look like?itching but look good  7.  Have you been passing gas? BM?having daily bm , reminded that miralax is an option if needed and can be taken daily   8.  If a problem or question were to arise who would you call?  Do you know contact numbers for [place], [facility], and [facility]?aware of how to contact all services  9.  How has the walking going?walking regularly in home and exercise  10.  How are your vitamins and calcium going?  How are you taking them?taking mvi and calcium as recommended without difficulty  Taking lovenox twice per day without difficulty just ready to be done with shots

## 2019-02-05 ENCOUNTER — Telehealth: Payer: Self-pay | Admitting: Neurology

## 2019-02-05 DIAGNOSIS — Z9989 Dependence on other enabling machines and devices: Secondary | ICD-10-CM

## 2019-02-05 DIAGNOSIS — G4733 Obstructive sleep apnea (adult) (pediatric): Secondary | ICD-10-CM

## 2019-02-05 NOTE — Addendum Note (Signed)
Addended by: Star Age on: 02/05/2019 09:53 AM   Modules accepted: Orders

## 2019-02-05 NOTE — Telephone Encounter (Signed)
I called pt and discussed this with him. Pt is agreeable to the pressure change and increasing his humidity. A follow up appt was made with Amy, NP on 1/18/21at 9:00am. Pt verbalized understanding of recommendations and of new appt date and time. Pt had no questions at this time but was encouraged to call back if questions arise. Order for pressure change sent to [employer] via community message. Confirmation received that the order transmitted was successful.

## 2019-02-05 NOTE — Telephone Encounter (Signed)
I reviewed patient's CPAP compliance data from 01/06/2019 through 02/04/2019 which is a total of 30 days, during which time he used his CPAP 29 days with percent use days greater than 4 hours at 93%, indicating excellent compliance with an average usage of 5 hours and 36 minutes, residual AHI at goal at 0.8/h, leak acceptable with a 95th percentile at 9.5 L/min on a pressure of 11 cm with EPR of 3.  Due to weight loss and difficulty tolerating the current pressure, I recommend that we reduce his treatment pressure to 9 cm from 11 cm currently.  Please send pressure change order to his DME company and notify patient either by email through my chart or phone call.  In addition, he may want to try to increase his humidity level slightly, Due to dry mouth reported, it is the drier season, increasing humidity may also help his tolerance. If needed, the DME company can walk him through the process of changing his humidity level on his machine.

## 2019-02-05 NOTE — Telephone Encounter (Signed)
Pt states he has lost weight, and he has been waking up with dry mouth from CPAP, no moisture in his mouth.  Please call to discuss

## 2019-02-05 NOTE — Telephone Encounter (Signed)
I called pt. He has lost 35 lbs from gastric bypass surgery. He is complaining of a dry mouth while using cpap. He called [employer] and they told him to call his doctor's office. I advised him that I will ask Dr. Rexene Alberts to review his data and then will call him back. Pt verbalized understanding.

## 2019-02-06 ENCOUNTER — Encounter: Payer: BC Managed Care – PPO | Attending: General Surgery | Admitting: Skilled Nursing Facility1

## 2019-02-06 ENCOUNTER — Other Ambulatory Visit: Payer: Self-pay

## 2019-02-06 ENCOUNTER — Encounter: Payer: Self-pay | Admitting: Family Medicine

## 2019-02-06 DIAGNOSIS — E669 Obesity, unspecified: Secondary | ICD-10-CM | POA: Diagnosis not present

## 2019-02-07 ENCOUNTER — Other Ambulatory Visit: Payer: Self-pay

## 2019-02-07 MED ORDER — LOSARTAN POTASSIUM 100 MG PO TABS: 100.0000 mg | ORAL_TABLET | Freq: Every day | ORAL | 1 refills | Status: DC

## 2019-02-07 NOTE — Telephone Encounter (Signed)
Patient called to update he has not heard from [employer] in regards to the pressure change. Please follow up.

## 2019-02-07 NOTE — Telephone Encounter (Signed)
Received this notice from [employer]: "I've called and left him a voicemail. His pressure has been changed, in case he calls you again!"

## 2019-02-07 NOTE — Progress Notes (Signed)
2 Week Post-Operative Nutrition Class   Patient was seen on 05/09/18 for Post-Operative Nutrition education at the [facility].    Surgery date: 01/22/2019 Surgery type: sleeve Start weight at [facility]: 278.7 Weight today: 251.6   Body Composition Scale 02/06/2019  Total Body Fat % 33  Visceral Fat 25  Fat-Free Mass % 66.9   Total Body Water % 47.9   Muscle-Mass lbs 47.4  Body Fat Displacement          Torso  lbs 51.5         Left Leg  lbs 10.3         Right Leg  lbs 10.3         Left Arm  lbs 5.1         Right Arm   lbs 5.1     The following the learning objectives were met by the patient during this course:  Identifies Phase 3 (Soft, High Proteins) Dietary Goals and will begin from 2 weeks post-operatively to 2 months post-operatively  Identifies appropriate sources of fluids and proteins   States protein recommendations and appropriate sources post-operatively  Identifies the need for appropriate texture modifications, mastication, and bite sizes when consuming solids  Identifies appropriate multivitamin and calcium sources post-operatively  Describes the need for physical activity post-operatively and will follow MD recommendations  States when to call healthcare provider regarding medication questions or post-operative complications   Handouts given during class include:  Phase 3A: Soft, High Protein Diet Handout   Follow-Up Plan: Patient will follow-up at [facility] in 6 weeks for 2 month post-op nutrition visit for diet advancement per MD.

## 2019-02-07 NOTE — Telephone Encounter (Signed)
I have reached out to [employer] to assist with this.

## 2019-02-12 ENCOUNTER — Telehealth: Payer: Self-pay | Admitting: Dietician

## 2019-02-12 NOTE — Telephone Encounter (Signed)
I spoke with patient via telephone to assess fluid intake and food tolerance since diet advancement to solid protein foods on 02/06/2019.  Surgery Date: 01/22/2019  Surgery Type: Sleeve   Daily fluid intake: 64 ounces Daily protein intake: 80 grams  Patient states his transition to solid foods has gone very well.  Foods include refried beans, eggs with cheese, ham, [place] sausage, pork tenderloin, and other protein foods. States he lost a lot of weight at first but it has stalled a little for a few days. Walks at least 1 mile per day. No issues or other concerns at this time.   Plan to follow up for diet advancement in January 2021.   Nat Christen [place]) Alona Danford, MS, RD, LDN

## 2019-02-14 DIAGNOSIS — G4733 Obstructive sleep apnea (adult) (pediatric): Secondary | ICD-10-CM | POA: Diagnosis not present

## 2019-03-14 ENCOUNTER — Other Ambulatory Visit: Payer: Self-pay

## 2019-03-14 DIAGNOSIS — N200 Calculus of kidney: Secondary | ICD-10-CM

## 2019-03-14 MED ORDER — ALLOPURINOL 300 MG PO TABS: ORAL_TABLET | ORAL | 2 refills | Status: DC

## 2019-03-23 ENCOUNTER — Encounter: Payer: Self-pay | Admitting: Dietician

## 2019-03-23 ENCOUNTER — Telehealth: Payer: BC Managed Care – PPO | Admitting: Dietician

## 2019-03-23 ENCOUNTER — Encounter: Payer: BC Managed Care – PPO | Attending: General Surgery | Admitting: Dietician

## 2019-03-23 DIAGNOSIS — E669 Obesity, unspecified: Secondary | ICD-10-CM | POA: Insufficient documentation

## 2019-03-23 DIAGNOSIS — Z9884 Bariatric surgery status: Secondary | ICD-10-CM

## 2019-03-23 NOTE — Progress Notes (Signed)
Bariatric Nutrition Follow-Up Visit Medical Nutrition Therapy  Appt Start Time: 7:30am   End Time: 8:00am  MyChart Visit  2 Months Post-Operative Sleeve Gastrectomy Surgery Surgery Date: 01/22/2019  Pt's Expectations of Surgery/ Goals:  to be more mobile (and keep up with granddaughter), relieve pain, and feel better   NUTRITION ASSESSMENT  Anthropometrics  Start weight at [facility]: 278.7 lbs (date: 05/12/2018) Today's weight: N/A (virtual visit)  Body Composition Scale 02/06/2019  Weight  lbs 251.6  BMI   Total Body Fat  % 33     Visceral Fat 25  Fat-Free Mass  % 66.9     Total Body Water  % 47.9     Muscle-Mass  lbs 47.4  Body Fat Displacement ---         Torso  lbs 51.5         Left Leg  lbs 10.3         Right Leg  lbs 10.3         Left Arm  lbs 5.1         Right Arm  lbs 5.1    Lifestyle & Dietary Hx Patient states he tolerates all foods and fluids fine. Eating primarily protein foods for all meals and snacks. Meeting fluid goal. Physically active throughout the week. States his weight has stalled recently, otherwise no concerns.    24-Hr Dietary Recall First Meal: Vilma Meckel breakfast cup (or breakfast burrito with [place] sausage)  Snack: - Second Meal: refried beans with cheese (or ham and beans) (or pinto beans w/ jalapenos) (or deli ham)  Snack: beef jerky (or protein shake)  Third Meal: 6 meatballs (or rotisserie chicken) (or shrimp)  Snack: - Beverages: water, water w/ Jefm Miles, Body Armor Lyte  Estimated daily fluid intake: 64+ oz Estimated daily protein intake: 80 g Supplements: bariatric MVI, calcium  Current average weekly physical activity: walking   Post-Op Goals/ Signs/ Symptoms Using straws: no Drinking while eating: no Chewing/swallowing difficulties: no Changes in vision: no Changes to mood/headaches: no Hair loss/changes to skin/nails: no  Difficulty focusing/concentrating: no Sweating: no Dizziness/lightheadedness: no Palpitations: no   Carbonated/caffeinated beverages: no N/V/D/C/Gas: no Abdominal pain: no Dumping syndrome: no   NUTRITION DIAGNOSIS  Overweight/obesity ([place]-3.3) related to past poor dietary habits and physical inactivity as evidenced by completed bariatric surgery and following dietary guidelines for continued weight loss and healthy nutrition status.   NUTRITION INTERVENTION Nutrition counseling (C-1) and education (E-2) to facilitate bariatric surgery goals, including: . Diet advancement to the next phase (phase 4) now including non-starchy vegetables . The importance of consuming adequate calories as well as certain nutrients daily due to the body's need for essential vitamins, minerals, and fats . The importance of daily physical activity and to reach a goal of at least 150 minutes of moderate to vigorous physical activity weekly (or as directed by their physician) due to benefits such as increased musculature and improved lab values  Handouts Provided Include   Phase 4: Protein + Non-Starchy Vegetables   Learning Style & Readiness for Change Teaching method utilized: Visual & Auditory  Demonstrated degree of understanding via: Teach Back  Barriers to learning/adherence to lifestyle change: None Identified    MONITORING & EVALUATION Dietary intake, weekly physical activity, body weight, and goals in 4 months.  Next Steps Patient is to follow-up in 4 months for 6 month post-op follow-up.

## 2019-04-02 ENCOUNTER — Ambulatory Visit: Payer: Self-pay | Admitting: Family Medicine

## 2019-04-09 ENCOUNTER — Other Ambulatory Visit: Payer: Self-pay

## 2019-04-09 DIAGNOSIS — I10 Essential (primary) hypertension: Secondary | ICD-10-CM

## 2019-04-09 MED ORDER — CARVEDILOL 12.5 MG PO TABS: 12.5000 mg | ORAL_TABLET | Freq: Two times a day (BID) | ORAL | 0 refills | Status: DC

## 2019-04-29 ENCOUNTER — Other Ambulatory Visit: Payer: Self-pay | Admitting: Family Medicine

## 2019-05-18 DIAGNOSIS — G4733 Obstructive sleep apnea (adult) (pediatric): Secondary | ICD-10-CM | POA: Diagnosis not present

## 2019-05-24 ENCOUNTER — Other Ambulatory Visit: Payer: Self-pay

## 2019-05-24 ENCOUNTER — Encounter: Payer: Self-pay | Admitting: Family Medicine

## 2019-05-24 ENCOUNTER — Ambulatory Visit (INDEPENDENT_AMBULATORY_CARE_PROVIDER_SITE_OTHER): Payer: BC Managed Care – PPO | Admitting: Family Medicine

## 2019-05-24 VITALS — BP 129/76 | HR 56 | Temp 97.5°F | Ht 69.0 in | Wt 239.4 lb

## 2019-05-24 DIAGNOSIS — G4733 Obstructive sleep apnea (adult) (pediatric): Secondary | ICD-10-CM

## 2019-05-24 DIAGNOSIS — Z9989 Dependence on other enabling machines and devices: Secondary | ICD-10-CM

## 2019-05-24 DIAGNOSIS — R4189 Other symptoms and signs involving cognitive functions and awareness: Secondary | ICD-10-CM

## 2019-05-24 NOTE — Patient Instructions (Addendum)
Continue CPAP nightly and greater than 4 hours each night   We can consider repeating sleep study in future if you wish  Follow up in 1 year, sooner if you need [place]   Memory Compensation Strategies  1. Use "WARM" strategy.  W= write it down  A= associate it  R= repeat it  M= make a mental note  2.   You can keep a Glass blower/designer.  Use a 3-ring notebook with sections for the following: calendar, important names and phone numbers,  medications, doctors' names/phone numbers, lists/reminders, and a section to journal what you did  each day.   3.    Use a calendar to write appointments down.  4.    Write yourself a schedule for the day.  This can be placed on the calendar or in a separate section of the Memory Notebook.  Keeping a  regular schedule can help memory.  5.    Use medication organizer with sections for each day or morning/evening pills.  You may need help loading it  6.    Keep a basket, or pegboard by the door.  Place items that you need to take out with you in the basket or on the pegboard.  You may also want to  include a message board for reminders.  7.    Use sticky notes.  Place sticky notes with reminders in a place where the task is performed.  For example: " turn off the  stove" placed by the stove, "lock the door" placed on the door at eye level, " take your medications" on  the bathroom mirror or by the place where you normally take your medications.  8.    Use alarms/timers.  Use while cooking to remind yourself to check on food or as a reminder to take your medicine, or as a  reminder to make a call, or as a reminder to perform another task, etc.   Sleep Apnea Sleep apnea affects breathing during sleep. It causes breathing to stop for a short time or to become shallow. It can also increase the risk of:  Heart attack.  Stroke.  Being very overweight (obese).  Diabetes.  Heart failure.  Irregular heartbeat. The goal of treatment is to help you  breathe normally again. What are the causes? There are three kinds of sleep apnea:  Obstructive sleep apnea. This is caused by a blocked or collapsed airway.  Central sleep apnea. This happens when the brain does not send the right signals to the muscles that control breathing.  Mixed sleep apnea. This is a combination of obstructive and central sleep apnea. The most common cause of this condition is a collapsed or blocked airway. This can happen if:  Your throat muscles are too relaxed.  Your tongue and tonsils are too large.  You are overweight.  Your airway is too small. What increases the risk?  Being overweight.  Smoking.  Having a small airway.  Being older.  Being male.  Drinking alcohol.  Taking medicines to calm yourself (sedatives or tranquilizers).  Having family members with the condition. What are the signs or symptoms?  Trouble staying asleep.  Being sleepy or tired during the day.  Getting angry a lot.  Loud snoring.  Headaches in the morning.  Not being able to focus your mind (concentrate).  Forgetting things.  Less interest in sex.  Mood swings.  Personality changes.  Feelings of sadness (depression).  Waking up a lot during the night to  pee (urinate).  Dry mouth.  Sore throat. How is this diagnosed?  Your medical history.  A physical exam.  A test that is done when you are sleeping (sleep study). The test is most often done in a sleep lab but may also be done at home. How is this treated?   Sleeping on your side.  Using a medicine to get rid of mucus in your nose (decongestant).  Avoiding the use of alcohol, medicines to help you relax, or certain pain medicines (narcotics).  Losing weight, if needed.  Changing your diet.  Not smoking.  Using a machine to open your airway while you sleep, such as: ? An oral appliance. This is a mouthpiece that shifts your lower jaw forward. ? A CPAP device. This device blows  air through a mask when you breathe out (exhale). ? An EPAP device. This has valves that you put in each nostril. ? A BPAP device. This device blows air through a mask when you breathe in (inhale) and breathe out.  Having surgery if other treatments do not work. It is important to get treatment for sleep apnea. Without treatment, it can lead to:  High blood pressure.  Coronary artery disease.  In men, not being able to have an erection (impotence).  Reduced thinking ability. Follow these instructions at home: Lifestyle  Make changes that your doctor recommends.  Eat a healthy diet.  Lose weight if needed.  Avoid alcohol, medicines to help you relax, and some pain medicines.  Do not use any products that contain nicotine or tobacco, such as cigarettes, e-cigarettes, and chewing tobacco. If you need help quitting, ask your doctor. General instructions  Take over-the-counter and prescription medicines only as told by your doctor.  If you were given a machine to use while you sleep, use it only as told by your doctor.  If you are having surgery, make sure to tell your doctor you have sleep apnea. You may need to bring your device with you.  Keep all follow-up visits as told by your doctor. This is important. Contact a doctor if:  The machine that you were given to use during sleep bothers you or does not seem to be working.  You do not get better.  You get worse. Get help right away if:  Your chest hurts.  You have trouble breathing in enough air.  You have an uncomfortable feeling in your back, arms, or stomach.  You have trouble talking.  One side of your body feels weak.  A part of your face is hanging down. These symptoms may be an emergency. Do not wait to see if the symptoms will go away. Get medical help right away. Call your local emergency services (911 in the [place] Do not drive yourself to the hospital. Summary  This condition affects breathing during  sleep.  The most common cause is a collapsed or blocked airway.  The goal of treatment is to help you breathe normally while you sleep. This information is not intended to replace advice given to you by your health care provider. Make sure you discuss any questions you have with your health care provider. Document Revised: 12/16/2017 Document Reviewed: 10/25/2017 Elsevier Patient Education  [place].

## 2019-05-24 NOTE — Progress Notes (Addendum)
PATIENT: Donald Gilbert DOB: [birth year]  REASON FOR VISIT: follow up HISTORY FROM: patient  Chief Complaint  Patient presents with  . Follow-up    Alone. Rm 6. Patient mentioned that he recently had bariatric sleeve surgery and has lost 55 pounds. He would like to know would this affect him using his cpap machine.      HISTORY OF PRESENT ILLNESS: Today 05/24/19 Donald Gilbert is a 64 y.o. male here today for follow up for OSA on CPAP. He reports that he is doing very well. He is status post bariatric sleeve in November 2020. He has lost approximately 55 pounds. He continues to tolerate CPAP therapy. He does feel that this significantly helps improve sleep quality. He denies any concerns with CPAP therapy today. He does mention that he is currently working out of town. He is only sleeping about 4 hours a night. He feels that there are times where he forgets what he wants to say. 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 He denies any concerns mentioned by his family. He is able to perform all ADLs and drives without difficulty.  Compliance report dated 04/22/2019 through 05/21/2019 reveals that he used CPAP 30 of the past 30 days for compliance of 100%.  23 days he used CPAP greater than 4 hours for compliance of 77%.  Average usage was 4 hours and 50 minutes.  Residual AHI was 0.4 on 9 cm of water and an EPR of 3.  There was no significant leak noted.  History (copied from my note on 06/01/2018)  Donald Gilbert is a 64 y.o. male here today for follow up for OSA on CPAP.  Patient reports compliance with nightly use and feels that CPAP is very beneficial.  Download report dated 04/30/2018 through 05/29/2018 reveals that he is using CPAP 30 out of 30 days for compliance of 100%.  He is using CPAP greater than 4 hours 30 out of 30 days for compliance 100%.  Average usage was 7 hours and 22 minutes.  AHI was 0.6 on 11 cm of water and EPR of 3.  There was no significant  leak.  He is tolerating CPAP well.  He is in need of new supplies.   HISTORY: (copied from Dr Teofilo Pod note on 10/20/2017)  Dear Dr. Earlene Plater,   I saw your patient, Donald Gilbert, upon your kind request in my neurologic clinic today for initial consultation of his sleep disorder in particular, reevaluation of his prior diagnosis of OSA. The patient is accompanied by his wife today. As you know, Donald Gilbert is a 64 year old right-handed gentleman with an underlying medical history of vitamin D deficiency, hypertension, hyperlipidemia, recurrent kidney stones, insulin resistance, b/l lower extremity edema, arthritis, eczema, remote history of smoking and morbid obesity with BMI of 40, who was previously diagnosed with obstructive sleep apnea and placed on CPAP therapy. Prior sleep study results are not available for my review today. We will request old sleep study records. He had a sleep specialist in [place] but has not seen him in over one year. CPAP supplies were coming from [place]. A CPAP download was reviewed today from 09/20/2017 through 10/19/2017 which is a total of 30 days, during which time he used his CPAP 29 days with percent used days greater than 4 hours at 76.7%, indicating adequate compliance with an average usage of 4 hours and 41 minutes, residual AHI at goal at 0.7 per hour, leak low, CPAP pressure  of 10 cm. I reviewed your office note from 08/02/2017. Using a Mirage FX nasal mask with success. He typically does not sleep very long. He averages about 4-5 hours of sleep he reports. Bedtime is generally around 8:30 or 9, rise time around 2 AM. He does not have night to night nocturia or recurrent morning headaches. He does not report any family history of OSA. He had UPPP after his original sleep apnea diagnosis. His sleep study was about 6 or 7 years ago. He moved from [place] back to [place] in October 2018 to be closer to his daughter and now 10-month-old granddaughter. He has another daughter  in [place]. He quit smoking in 1991 and drinks alcohol occasionally, once or twice per week, caffeine none regular basis, came off of soda. He lives with his wife. They help take care of the grandbaby. He would like to get new supplies. 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 Weight has fluctuated a little bit but generally has remained fairly stable.   REVIEW OF SYSTEMS: Out of a complete 14 system review of symptoms, the patient complains only of the following symptoms, word finding difficulty at times  and all other reviewed systems are negative.  ALLERGIES: No Known Allergies  HOME MEDICATIONS: Outpatient Medications Prior to Visit  Medication Sig Dispense Refill  . allopurinol (ZYLOPRIM) 300 MG tablet TAKE 1 TABLET(300 MG) BY MOUTH DAILY 90 tablet 2  . buPROPion (WELLBUTRIN XL) 150 MG 24 hr tablet TAKE 1 TABLET(150 MG) BY MOUTH DAILY 60 tablet 0  . calcium carbonate (TUMS - DOSED IN MG ELEMENTAL CALCIUM) 500 MG chewable tablet Chew 1 tablet by mouth 3 (three) times daily with meals.    . carvedilol (COREG) 12.5 MG tablet Take 1 tablet (12.5 mg total) by mouth 2 (two) times daily with a meal. 180 tablet 0  . losartan (COZAAR) 100 MG tablet TAKE 1 TABLET BY MOUTH EVERY DAY 90 tablet 1  . Multiple Vitamin (MULTIVITAMIN WITH MINERALS) TABS tablet Take 1 tablet by mouth daily.    . pantoprazole (PROTONIX) 40 MG tablet Take 1 tablet (40 mg total) by mouth daily. 90 tablet 0  . simvastatin (ZOCOR) 40 MG tablet TAKE 1 TABLET(40 MG) BY MOUTH DAILY (Patient taking differently: Take 40 mg by mouth daily. ) 90 tablet 2  . enoxaparin (LOVENOX) 40 MG/0.4ML injection Inject 0.4 mLs (40 mg total) into the skin every 12 (twelve) hours for 14 days. 11.2 mL 0  . gabapentin (NEURONTIN) 100 MG capsule Take 2 capsules (200 mg total) by mouth every 12 (twelve) hours. 20 capsule 0  . losartan-hydrochlorothiazide (HYZAAR) 100-25 MG tablet TAKE 1 TABLET BY MOUTH DAILY 90 tablet  3  . ondansetron (ZOFRAN-ODT) 4 MG disintegrating tablet Take 1 tablet (4 mg total) by mouth every 6 (six) hours as needed for nausea or vomiting. 20 tablet 0  . oxymetazoline (AFRIN) 0.05 % nasal spray Place 1 spray into both nostrils at bedtime as needed for congestion.     . traMADol (ULTRAM) 50 MG tablet Take 1 tablet (50 mg total) by mouth every 6 (six) hours as needed (pain). 10 tablet 0  . Vitamin D, Ergocalciferol, (DRISDOL) 1.25 MG (50000 UT) CAPS capsule Take 50,000 Units by mouth every Friday.     No facility-administered medications prior to visit.    PAST MEDICAL HISTORY: Past Medical History:  Diagnosis Date  . Allergy    mild   . Arthralgia of multiple joints  04/03/2017  . Arthritis   . Atopic dermatitis 04/03/2017  . BPPV (benign paroxysmal positional vertigo)   . Chickenpox   . Constipation    maybe twice a week due to job   . Diabetes mellitus without complication ([facility])    type 2  . Difficult intubation   . Dysthymia 04/03/2017  . History of kidney stones    recurrent  . Hyperlipidemia   . Hypertension   . Insulin resistance 04/03/2017  . Nasal polyp   . OSA on CPAP   . Sleep apnea    wears cpap   11cm H20  . Urinary hesitancy 04/03/2017  . Vitamin D deficiency 04/03/2017    PAST SURGICAL HISTORY: Past Surgical History:  Procedure Laterality Date  . COLONOSCOPY  03/2008   no precancerous polyps  . COLONOSCOPY WITH PROPOFOL N/A 10/02/2018   Procedure: COLONOSCOPY WITH PROPOFOL;  Surgeon: Doran Stabler, MD;  Location: [facility];  Service: Gastroenterology;  Laterality: N/A;  . LAPAROSCOPIC GASTRIC SLEEVE RESECTION N/A 01/22/2019   Procedure: LAPAROSCOPIC GASTRIC SLEEVE RESECTION AND HIATAL HERNIA REPAIR, Upper Endo, Eras Pathway;  Surgeon: [name], MD;  Location: [facility];  Service: General;  Laterality: N/A;  . LASIK    . TONSILLECTOMY    . UMBILICAL HERNIA REPAIR     2016  . uvula removed       FAMILY HISTORY: Family History  Problem  Relation Age of Onset  . Hypertension Mother   . Colon cancer Neg Hx   . Colon polyps Neg Hx   . Esophageal cancer Neg Hx   . Rectal cancer Neg Hx   . Stomach cancer Neg Hx     SOCIAL HISTORY: Social History   Socioeconomic History  . Marital status: Married    Spouse name: Not on file  . Number of children: Not on file  . Years of education: Not on file  . Highest education level: Not on file  Occupational History    Employer: Special educational needs teacher  Tobacco Use  . Smoking status: Former Smoker    Quit date: 11/1989    Years since quitting: 29.5  . Smokeless tobacco: Former Systems developer    Types: [place] date: 2001  Substance and Sexual Activity  . Alcohol use: Not Currently    Comment: OCC   . Drug use: No  . Sexual activity: Yes  Other Topics Concern  . Not on file  Social History Narrative  . Not on file   Social Determinants of Health   Financial Resource Strain:   . Difficulty of Paying Living Expenses:   Food Insecurity:   . Worried About Charity fundraiser in the Last Year:   . Arboriculturist in the Last Year:   Transportation Needs:   . Film/video editor (Medical):   Marland Kitchen Lack of Transportation (Non-Medical):   Physical Activity:   . Days of Exercise per Week:   . Minutes of Exercise per Session:   Stress:   . Feeling of Stress :   Social Connections:   . Frequency of Communication with Friends and Family:   . Frequency of Social Gatherings with Friends and Family:   . Attends Religious Services:   . Active Member of Clubs or Organizations:   . Attends Archivist Meetings:   Marland Kitchen Marital Status:   Intimate Partner Violence:   . Fear of Current or Ex-Partner:   . Emotionally Abused:   Marland Kitchen Physically Abused:   .  Sexually Abused:       PHYSICAL EXAM  Vitals:   05/24/19 1510  BP: 129/76  Pulse: (!) 56  Temp: (!) 97.5 F (36.4 C)  TempSrc: Oral  Weight: 239 lb 6.4 oz (108.6 kg)  Height: 5\' 9"  (1.753 m)   Body mass index is  35.35 kg/m.  Generalized: Well developed, in no acute distress  Cardiology: normal rate and rhythm, no murmur noted Respiratory: Clear to auscultation bilaterally Neurological examination  Mentation: Alert oriented to time, place, history taking. Follows all commands speech and language fluent Cranial nerve II-XII: Pupils were equal round reactive to light. Extraocular movements were full, visual field were full  Motor: The motor testing reveals 5 over 5 strength of all 4 extremities. Good symmetric motor tone is noted throughout.  Gait and station: Gait is normal.  DIAGNOSTIC DATA (LABS, IMAGING, TESTING) - I reviewed patient records, labs, notes, testing and imaging myself where available.  No flowsheet data found.   Lab Results  Component Value Date   WBC 12.0 (H) 01/23/2019   HGB 14.2 01/23/2019   HCT 42.7 01/23/2019   MCV 92.0 01/23/2019   PLT 198 01/23/2019      Component Value Date/Time   NA 137 01/23/2019 0258   K 3.8 01/23/2019 0258   CL 101 01/23/2019 0258   CO2 26 01/23/2019 0258   GLUCOSE 148 (H) 01/23/2019 0258   BUN 24 (H) 01/23/2019 0258   BUN 16 11/10/2016 0000   CREATININE 1.06 01/23/2019 0258   CREATININE 1.19 02/24/2018 1608   CALCIUM 8.7 (L) 01/23/2019 0258   PROT 6.5 01/23/2019 0258   ALBUMIN 3.8 01/23/2019 0258   AST 43 (H) 01/23/2019 0258   ALT 67 (H) 01/23/2019 0258   ALKPHOS 60 01/23/2019 0258   BILITOT 1.0 01/23/2019 0258   GFRNONAA >60 01/23/2019 0258   GFRAA >60 01/23/2019 0258   Lab Results  Component Value Date   CHOL 157 05/09/2018   HDL 37 05/09/2018   LDLCALC 93 05/09/2018   TRIG 171 (A) 05/09/2018   CHOLHDL 4 04/04/2017   Lab Results  Component Value Date   HGBA1C 5.6 01/19/2019   Lab Results  Component Value Date   VITAMINB12 451 04/04/2017   Lab Results  Component Value Date   TSH 1.93 05/09/2018     ASSESSMENT AND PLAN 64 y.o. year old male  has a past medical history of Allergy, Arthralgia of multiple joints  (04/03/2017), Arthritis, Atopic dermatitis (04/03/2017), BPPV (benign paroxysmal positional vertigo), Chickenpox, Constipation, Diabetes mellitus without complication ([facility]), Difficult intubation, Dysthymia (04/03/2017), History of kidney stones, Hyperlipidemia, Hypertension, Insulin resistance (04/03/2017), Nasal polyp, OSA on CPAP, Sleep apnea, Urinary hesitancy (04/03/2017), and Vitamin D deficiency (04/03/2017). here with     ICD-10-CM   1. OSA on CPAP  G47.33 For home use only DME continuous positive airway pressure (CPAP)   Z99.89   2. Subjective memory complaints  R41.89     Donald Gilbert is doing very well with CPAP therapy. Compliance report reveals excellent compliance. He was encouraged to continue using CPAP nightly and for greater than 4 hours each night. We have discussed possibility of repeating sleep study, however, at this time he continues to note significant improvement in sleep quality with CPAP therapy. Sleep study did reveal severe obstructive sleep apnea. We will follow-up in 1 year to discuss repeating sleep study if weight loss continues. We have also discussed concerns of word finding difficulty from time to time. He denies any other concerns of confusion  or memory loss. He does admit that he is spending much more time out of town at work and is not sleeping as well as he does at home. He feels that this could be contributing. He is exercising every day and was encouraged to continue doing so. He will call me with any new or worsening concerns. He will follow-up in 1 year, sooner if needed. He verbalizes understanding and agreement with this plan.   Orders Placed This Encounter  Procedures  . For home use only DME continuous positive airway pressure (CPAP)    supplies    Order Specific Question:   Length of Need    Answer:   Lifetime    Order Specific Question:   Patient has OSA or probable OSA    Answer:   Yes    Order Specific Question:   Is the patient currently using CPAP in the  home    Answer:   Yes    Order Specific Question:   Settings    Answer:   Other see comments    Order Specific Question:   CPAP supplies needed    Answer:   Mask, headgear, cushions, filters, heated tubing and water chamber     No orders of the defined types were placed in this encounter.     I spent 20 minutes with the patient. 50% of this time was spent counseling and educating patient on plan of care and medications.    Shawnie Dappermy Dwyne Hasegawa, FNP-C 05/24/2019, 3:50 PM [facility] [address], [address] [place], [place] [address] [phone]  I reviewed the above note and documentation by the Nurse Practitioner and agree with the history, exam, assessment and plan as outlined above. I was available for consultation. Huston FoleySaima Athar, MD, PhD [facility]

## 2019-05-26 ENCOUNTER — Ambulatory Visit: Payer: BC Managed Care – PPO

## 2019-05-26 ENCOUNTER — Ambulatory Visit: Payer: BC Managed Care – PPO | Attending: Internal Medicine

## 2019-05-26 DIAGNOSIS — Z23 Encounter for immunization: Secondary | ICD-10-CM

## 2019-05-26 NOTE — Progress Notes (Signed)
   Covid-19 Vaccination Clinic  Name:  Donald Gilbert    MRN: [id number] DOB: [birth year]  05/26/2019  Mr. Donald Gilbert was observed post Covid-19 immunization for 15 minutes without incident. He was provided with Vaccine Information Sheet and instruction to access the V-Safe system.   Mr. Donald Gilbert was instructed to call 911 with any severe reactions post vaccine: Marland Kitchen Difficulty breathing  . Swelling of face and throat  . A fast heartbeat  . A bad rash all over body  . Dizziness and weakness   Immunizations Administered    Name Date Dose VIS Date Route   Pfizer COVID-19 Vaccine 05/26/2019  9:25 AM 0.3 mL 02/23/2019 Intramuscular   Manufacturer: [employer], [employer]   Lot: [id number]   NDC: [address]-9

## 2019-05-29 NOTE — Progress Notes (Addendum)
CM to [facility] re: cpap orders in Epic. Received.

## 2019-06-07 ENCOUNTER — Other Ambulatory Visit: Payer: Self-pay | Admitting: Family Medicine

## 2019-06-07 ENCOUNTER — Other Ambulatory Visit: Payer: Self-pay

## 2019-06-07 DIAGNOSIS — E782 Mixed hyperlipidemia: Secondary | ICD-10-CM

## 2019-06-07 MED ORDER — SIMVASTATIN 40 MG PO TABS: ORAL_TABLET | ORAL | 2 refills | Status: DC

## 2019-06-14 ENCOUNTER — Telehealth (INDEPENDENT_AMBULATORY_CARE_PROVIDER_SITE_OTHER): Payer: BC Managed Care – PPO | Admitting: Family Medicine

## 2019-06-14 VITALS — Wt 230.0 lb

## 2019-06-14 DIAGNOSIS — I10 Essential (primary) hypertension: Secondary | ICD-10-CM | POA: Diagnosis not present

## 2019-06-14 DIAGNOSIS — F341 Dysthymic disorder: Secondary | ICD-10-CM

## 2019-06-14 DIAGNOSIS — E782 Mixed hyperlipidemia: Secondary | ICD-10-CM

## 2019-06-14 NOTE — Assessment & Plan Note (Signed)
Recommended patient check blood pressures at home for the next couple of weeks and check in with me via MyChart.  Will down titrate Coreg as tolerated.

## 2019-06-14 NOTE — Assessment & Plan Note (Signed)
Will take Wellbutrin off med list.  Has not been on this for several months and has not noticed any difference.

## 2019-06-14 NOTE — Progress Notes (Signed)
   Donald Gilbert is a 64 y.o. male who presents today for a virtual office visit.  Assessment/Plan:  Chronic Problems Addressed Today: Dysthymia Will take Wellbutrin off med list.  Has not been on this for several months and has not noticed any difference.  Mixed hyperlipidemia We will stop simvastatin.  He will come back in a few months for CPE and recheck lipid panel at that time.  Essential hypertension Recommended patient check blood pressures at home for the next couple of weeks and check in with me via MyChart.  Will down titrate Coreg as tolerated.    Subjective:  HPI:  Patient here for follow-up.  Underwent sleeve gastrectomy about 5 months ago.  Since our last visit, we switch his losartan-HCTZ combination pill to losartan 100 mg daily.  He has done well with surgery.  Lost about 50 pounds.  He would like to stop some of his medications.  He is no long taking Wellbutrin.  He would like to stop his cholesterol medication.  Is also resting coming down blood pressure meds.  Does not regularly check blood pressure.       Objective/Observations  Physical Exam: Gen: NAD, resting comfortably Pulm: Normal work of breathing Neuro: Grossly normal, moves all extremities Psych: Normal affect and thought content  Virtual Visit via Video   I connected with Donald Gilbert on 06/14/19 at  3:40 PM EDT by a video enabled telemedicine application and verified that I am speaking with the correct person using two identifiers. The limitations of evaluation and management by telemedicine and the availability of in person appointments were discussed. The patient expressed understanding and agreed to proceed.   Patient location: Home Provider location:  [place] [employer] Persons participating in the virtual visit: Myself and Patient     Donald Gilbert. Donald Ralph, MD 06/14/2019 2:22 PM

## 2019-06-14 NOTE — Assessment & Plan Note (Signed)
We will stop simvastatin.  He will come back in a few months for CPE and recheck lipid panel at that time.

## 2019-06-25 ENCOUNTER — Ambulatory Visit: Payer: BC Managed Care – PPO

## 2019-07-03 ENCOUNTER — Encounter: Payer: Self-pay | Admitting: Family Medicine

## 2019-07-04 ENCOUNTER — Other Ambulatory Visit: Payer: Self-pay | Admitting: *Deleted

## 2019-07-04 ENCOUNTER — Other Ambulatory Visit: Payer: Self-pay

## 2019-07-04 MED ORDER — SCOPOLAMINE 1 MG/3DAYS TD PT72: 1.0000 | MEDICATED_PATCH | TRANSDERMAL | 1 refills | Status: DC

## 2019-07-04 NOTE — Telephone Encounter (Signed)
Patient notified and Rx sent

## 2019-07-07 ENCOUNTER — Other Ambulatory Visit: Payer: Self-pay | Admitting: Family Medicine

## 2019-07-07 DIAGNOSIS — I10 Essential (primary) hypertension: Secondary | ICD-10-CM

## 2019-07-27 ENCOUNTER — Encounter: Payer: BC Managed Care – PPO | Attending: General Surgery | Admitting: Dietician

## 2019-07-27 ENCOUNTER — Encounter: Payer: Self-pay | Admitting: Dietician

## 2019-07-27 ENCOUNTER — Other Ambulatory Visit: Payer: Self-pay

## 2019-07-27 DIAGNOSIS — E669 Obesity, unspecified: Secondary | ICD-10-CM | POA: Diagnosis not present

## 2019-07-27 DIAGNOSIS — Z9884 Bariatric surgery status: Secondary | ICD-10-CM | POA: Diagnosis not present

## 2019-07-27 DIAGNOSIS — K912 Postsurgical malabsorption, not elsewhere classified: Secondary | ICD-10-CM | POA: Diagnosis not present

## 2019-07-27 NOTE — Progress Notes (Signed)
Bariatric Nutrition Follow-Up Visit Medical Nutrition Therapy  Appt Start Time: 7:15am   End Time: 7:50am  MyChart Visit  6 Months Post-Operative Sleeve Gastrectomy Surgery Surgery Date: 01/22/2019  Pt's Expectations of Surgery/ Goals:  to be more mobile (and keep up with granddaughter), relieve pain, and feel better Pt Reported Successes: more energy, able to weed eat the whole yard without stopping, no sciatic nerve pain  NUTRITION ASSESSMENT  Anthropometrics  Start weight at [facility]: 278.7 lbs (date: 05/12/2018) Today's weight: 234.6 lbs  Body Composition Scale 02/06/2019 07/27/2019  Weight  lbs 251.6 234.6  BMI 37.1 34.4  Total Body Fat  % 33 30.8     Visceral Fat 25 22  Fat-Free Mass  % 66.9 69.1     Total Body Water  % 47.9 50.1     Muscle-Mass  lbs 47.4 44.1  Body Fat Displacement --- ---         Torso  lbs 51.5 44.8         Left Leg  lbs 10.3 8.9         Right Leg  lbs 10.3 8.9         Left Arm  lbs 5.1 4.4         Right Arm  lbs 5.1 4.4    Lifestyle & Dietary Hx Patient states he tolerates all foods and fluids fine. Eating primarily protein foods for all meals and snacks. Meeting fluid goal. Physically active throughout the week. May have sushi, shrimp fajitas w/ refried beans, cowboy beans, 15 bean soup, meatloaf with lima beans. States he does not eat a lot at one time and finds himself feeling hungry throughout the day, about every 4 hours. States he would like to continue avoiding carbohydrates.   24-Hr Dietary Recall First Meal: 7 meatballs  Snack: Premier Protein shake  Second Meal: eggs + sausage  Snack: shrimp Third Meal: pork roast w/ sugar-free BBQ sauce Snack: - Beverages: water, water w/ Georgiann Hahn, Body [employer]or [employer]te  Estimated daily fluid intake: 2-3 L water per day plus other fluids  Estimated daily protein intake: 80 g Supplements: bariatric MVI, calcium  Current average weekly physical activity: walking   Post-Op Goals/ Signs/ Symptoms Using  straws: no Drinking while eating: no Chewing/swallowing difficulties: no Changes in vision: no Changes to mood/headaches: no Hair loss/changes to skin/nails: no  Difficulty focusing/concentrating: no Sweating: no Dizziness/lightheadedness: no Palpitations: no  Carbonated/caffeinated beverages: no N/V/D/C/Gas: no Abdominal pain: no Dumping syndrome: no   NUTRITION DIAGNOSIS  Overweight/obesity ([place]-3.3) related to past poor dietary habits and physical inactivity as evidenced by completed bariatric surgery and following dietary guidelines for continued weight loss and healthy nutrition status.   NUTRITION INTERVENTION Nutrition counseling (C-1) and education (E-2) to facilitate bariatric surgery goals, including: . Diet advancement to the next phase (phase 5) now including starchy vegetables . The importance of consuming adequate calories as well as certain nutrients daily due to the body's need for essential vitamins, minerals, and fats . The importance of daily physical activity and to reach a goal of at least 150 minutes of moderate to vigorous physical activity weekly (or as directed by their physician) due to benefits such as increased musculature and improved lab values  Handouts Provided Include   Phase 5: Protein + All Vegetables   Bariatric Snack Ideas  Learning Style & Readiness for Change Teaching method utilized: Visual & Auditory  Demonstrated degree of understanding via: Teach Back  Barriers to learning/adherence to lifestyle change: None  Identified    MONITORING & EVALUATION Dietary intake, weekly physical activity, body weight, and goals in 3 months.  Next Steps Patient is to follow-up in 3 months for 9 month post-op follow-up.

## 2019-07-27 NOTE — Patient Instructions (Signed)
.   Continue to aim for a minimum of 64 fluid ounces daily with at least 32 ounces being plain water . Eat non-starchy vegetables 2 times a day 7 days a week . Per meal/snack, eat 2-3 ounces of protein first  o  Add starchy vegetables (potatoes, sweet potatoes, corn, peas) as desired as long as you are able to meet your daily protein goal and eat non-starchy vegetables 2+ times per day . Continue to aim for 30 minutes of physical activity at least 5 times a week . Remember to take 3 calcium's plus your bariatric multivitamin DAILY  

## 2019-09-06 DIAGNOSIS — G4733 Obstructive sleep apnea (adult) (pediatric): Secondary | ICD-10-CM | POA: Diagnosis not present

## 2019-10-05 ENCOUNTER — Ambulatory Visit (INDEPENDENT_AMBULATORY_CARE_PROVIDER_SITE_OTHER): Payer: BC Managed Care – PPO | Admitting: Family Medicine

## 2019-10-05 ENCOUNTER — Other Ambulatory Visit: Payer: Self-pay | Admitting: Family Medicine

## 2019-10-05 ENCOUNTER — Encounter: Payer: Self-pay | Admitting: Family Medicine

## 2019-10-05 ENCOUNTER — Other Ambulatory Visit: Payer: Self-pay

## 2019-10-05 VITALS — BP 130/76 | HR 53 | Temp 97.5°F | Ht 69.0 in | Wt 235.0 lb

## 2019-10-05 DIAGNOSIS — E782 Mixed hyperlipidemia: Secondary | ICD-10-CM

## 2019-10-05 DIAGNOSIS — R7303 Prediabetes: Secondary | ICD-10-CM

## 2019-10-05 DIAGNOSIS — L989 Disorder of the skin and subcutaneous tissue, unspecified: Secondary | ICD-10-CM

## 2019-10-05 DIAGNOSIS — E669 Obesity, unspecified: Secondary | ICD-10-CM

## 2019-10-05 DIAGNOSIS — G8929 Other chronic pain: Secondary | ICD-10-CM

## 2019-10-05 DIAGNOSIS — Z125 Encounter for screening for malignant neoplasm of prostate: Secondary | ICD-10-CM

## 2019-10-05 DIAGNOSIS — I10 Essential (primary) hypertension: Secondary | ICD-10-CM

## 2019-10-05 DIAGNOSIS — Z9884 Bariatric surgery status: Secondary | ICD-10-CM

## 2019-10-05 DIAGNOSIS — E559 Vitamin D deficiency, unspecified: Secondary | ICD-10-CM

## 2019-10-05 DIAGNOSIS — M542 Cervicalgia: Secondary | ICD-10-CM

## 2019-10-05 NOTE — Assessment & Plan Note (Signed)
Check A1c. 

## 2019-10-05 NOTE — Assessment & Plan Note (Signed)
Check lipid panel  

## 2019-10-05 NOTE — Assessment & Plan Note (Signed)
At goal today.  He is only taking a nighttime dose of Coreg -doubt this is giving him much benefit for blood pressure control at this point.  We will cut this to 6.25 mg nightly for a week or so and then stop.  He will send a MyChart message for stopping his medication 11 and has blood pressures are looking.

## 2019-10-05 NOTE — Patient Instructions (Signed)
It was very nice to see you today!  We will check blood work today.  Please take half of a tablet of your Coreg for a week or 2 and then stop.  Please see me message to let me know how your blood pressures are looking before you stop taking the Coreg.  The sensation you are having are likely premature beats called PVCs.  If you start to develop any symptoms including chest pain, shortness of breath, dizziness, or feel like you are going to pass out please let me know.  Please work on exercises for your neck.  Take care, Dr [name]  Please try these tips to maintain a healthy lifestyle:   Eat at least 3 REAL meals and 1-2 snacks per day.  Aim for no more than 5 hours between eating.  If you eat breakfast, please do so within one hour of getting up.    Each meal should contain half fruits/vegetables, one quarter protein, and one quarter carbs (no bigger than a computer mouse)   Cut down on sweet beverages. This includes juice, soda, and sweet tea.     Drink at least 1 glass of water with each meal and aim for at least 8 glasses per day   Exercise at least 150 minutes every week.

## 2019-10-05 NOTE — Progress Notes (Signed)
Donald Gilbert is a 64 y.o. male who presents today for an office visit.  Assessment/Plan:  New/Acute Problems: Skin Lesions Will place referral to dermatology  Palpitations No red flags.  Based on history seems to be more consistent with PVCs.  Has not had any symptoms in a while since restarting Coreg.  Given that symptoms have mostly resolved and is not having any current symptoms, discussed low utility of EKG at this point.  Will defer for now.  We will continue with watchful waiting and he will let me know if has any concerning signs or symptoms including chest pain, shortness of breath, dizziness, or presyncope.  Encourage good oral hydration.  Discussed reasons to return to care.  Chronic Problems Addressed Today: Chronic neck pain No red flags.  He is typically in the town during the week so it is hard for him to come back to see physical therapy or sports medicine.  Discussed home exercises and handout was given.  He will let me know if he needs referral to sports med or physical therapy.  S/P laparoscopic sleeve gastrectomy Check folic acid, B12, and vitamin D.  Prediabetes Check A1c.  Mixed hyperlipidemia Check lipid panel.  Essential hypertension At goal today.  He is only taking a nighttime dose of Coreg -doubt this is giving him much benefit for blood pressure control at this point.  We will cut this to 6.25 mg nightly for a week or so and then stop.  He will send a MyChart message for stopping his medication 11 and has blood pressures are looking.  Obesity Weight stable.  Vitamin D deficiency Check vitamin D.     Subjective:  HPI:  Patient here for follow-up.  Over last several weeks he has noticed a thumping sensation in his chest.  This occurs randomly.  Usually occurs at rest.  Happens for just a couple of moments and then goes away.  No associated chest pain or shortness of breath.  No dizziness.  No feeling like he is going to pass out.  He stopped taking  his Coreg for a few weeks and has noticed improvement in his symptoms since restarting.  He has not had any other changes.  Does think he has been slightly more dehydrated lately.  Is also had chronic issues with neck pain.  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  Pain is currently manageable.       Objective:  Physical Exam: BP (!) 130/76 (BP Location: Left Arm, Patient Position: Sitting, Cuff Size: Large)   Pulse 53   Temp (!) 97.5 F (36.4 C) (Temporal)   Ht 5\' 9"  (1.753 m)   Wt (!) 235 lb (106.6 kg)   SpO2 98%   BMI 34.70 kg/m   Wt Readings from Last 3 Encounters:  10/05/19 (!) 235 lb (106.6 kg)  07/27/19 234 lb 9.6 oz (106.4 kg)  06/14/19 230 lb (104.3 kg)    Gen: No acute distress, resting comfortably CV: Regular rate and rhythm with no murmurs appreciated Pulm: Normal work of breathing, clear to auscultation bilaterally with no crackles, wheezes, or rhonchi MSK: - Neck: No deformities.  No midline tenderness.  Slightly decreased range of motion with leftward and rightward rotation. Neuro: Grossly normal, moves all extremities Psych: Normal affect and thought content  Time Spent: 45 minutes of total time was spent on the date of the encounter performing the following actions: chart review  prior to seeing the patient, obtaining history, performing a medically necessary exam, counseling on the treatment plan including plan for his palpitations,  placing orders, and documenting in our EHR.        [name]. [name], MD 10/05/2019 8:36 AM

## 2019-10-05 NOTE — Assessment & Plan Note (Signed)
No red flags.  He is typically in the town during the week so it is hard for him to come back to see physical therapy or sports medicine.  Discussed home exercises and handout was given.  He will let me know if he needs referral to sports med or physical therapy.

## 2019-10-05 NOTE — Assessment & Plan Note (Signed)
Check folic acid, B12, and vitamin D.

## 2019-10-05 NOTE — Assessment & Plan Note (Signed)
Weight stable.

## 2019-10-05 NOTE — Assessment & Plan Note (Signed)
Check vitamin D. 

## 2019-10-06 LAB — COMPREHENSIVE METABOLIC PANEL
AG Ratio: 2 (calc) (ref 1.0–2.5)
ALT: 13 U/L (ref 9–46)
AST: 13 U/L (ref 10–35)
Albumin: 4.3 g/dL (ref 3.6–5.1)
Alkaline phosphatase (APISO): 57 U/L (ref 35–144)
BUN/Creatinine Ratio: 38 (calc) — ABNORMAL HIGH (ref 6–22)
BUN: 36 mg/dL — ABNORMAL HIGH (ref 7–25)
CO2: 30 mmol/L (ref 20–32)
Calcium: 9.1 mg/dL (ref 8.6–10.3)
Chloride: 106 mmol/L (ref 98–110)
Creat: 0.96 mg/dL (ref 0.70–1.25)
Globulin: 2.1 g/dL (calc) (ref 1.9–3.7)
Glucose, Bld: 95 mg/dL (ref 65–99)
Potassium: 4.2 mmol/L (ref 3.5–5.3)
Sodium: 141 mmol/L (ref 135–146)
Total Bilirubin: 0.8 mg/dL (ref 0.2–1.2)
Total Protein: 6.4 g/dL (ref 6.1–8.1)

## 2019-10-06 LAB — CBC
HCT: 46 % (ref 38.5–50.0)
Hemoglobin: 15.1 g/dL (ref 13.2–17.1)
MCH: 29.5 pg (ref 27.0–33.0)
MCHC: 32.8 g/dL (ref 32.0–36.0)
MCV: 90 fL (ref 80.0–100.0)
MPV: 11.8 fL (ref 7.5–12.5)
Platelets: 173 10*3/uL (ref 140–400)
RBC: 5.11 10*6/uL (ref 4.20–5.80)
RDW: 13.1 % (ref 11.0–15.0)
WBC: 6 10*3/uL (ref 3.8–10.8)

## 2019-10-06 LAB — HEMOGLOBIN A1C
Hgb A1c MFr Bld: 5.4 % of total Hgb (ref <5.7)
Mean Plasma Glucose: 108 (calc)
eAG (mmol/L): 6 (calc)

## 2019-10-06 LAB — LIPID PANEL
Cholesterol: 209 mg/dL — ABNORMAL HIGH (ref <200)
HDL: 42 mg/dL (ref >=40)
LDL Cholesterol (Calc): 149 mg/dL (calc) — ABNORMAL HIGH
Non-HDL Cholesterol (Calc): 167 mg/dL (calc) — ABNORMAL HIGH (ref <130)
Total CHOL/HDL Ratio: 5 (calc) — ABNORMAL HIGH (ref <5.0)
Triglycerides: 75 mg/dL (ref <150)

## 2019-10-06 LAB — VITAMIN B12: Vitamin B-12: 1405 pg/mL — ABNORMAL HIGH (ref 200–1100)

## 2019-10-06 LAB — TSH: TSH: 1.72 mIU/L (ref 0.40–4.50)

## 2019-10-06 LAB — VITAMIN D 25 HYDROXY (VIT D DEFICIENCY, FRACTURES): Vit D, 25-Hydroxy: 46 ng/mL (ref 30–100)

## 2019-10-06 LAB — PSA: PSA: 1.9 ng/mL (ref <=4.0)

## 2019-10-06 LAB — FOLATE: Folate: >24 ng/mL

## 2019-10-08 NOTE — Progress Notes (Signed)
Please inform patient of the following:  Labs show that is is mildly dehydrated - would like for him to increase fluid intake if possible. Cholesterol levels are also up a bit since last year. Everything else is STABLE. Would like for him to keep working on diet and exercise and we can recheck in a year.  [name]. [name], MD 10/08/2019 8:45 AM

## 2019-10-18 ENCOUNTER — Other Ambulatory Visit: Payer: Self-pay | Admitting: Family Medicine

## 2019-10-20 ENCOUNTER — Encounter: Payer: Self-pay | Admitting: Family Medicine

## 2019-10-26 ENCOUNTER — Ambulatory Visit: Payer: BC Managed Care – PPO | Admitting: Dietician

## 2019-11-30 DIAGNOSIS — L578 Other skin changes due to chronic exposure to nonionizing radiation: Secondary | ICD-10-CM | POA: Diagnosis not present

## 2019-11-30 DIAGNOSIS — L814 Other melanin hyperpigmentation: Secondary | ICD-10-CM | POA: Diagnosis not present

## 2019-11-30 DIAGNOSIS — D1801 Hemangioma of skin and subcutaneous tissue: Secondary | ICD-10-CM | POA: Diagnosis not present

## 2019-11-30 DIAGNOSIS — D2271 Melanocytic nevi of right lower limb, including hip: Secondary | ICD-10-CM | POA: Diagnosis not present

## 2019-11-30 DIAGNOSIS — D485 Neoplasm of uncertain behavior of skin: Secondary | ICD-10-CM | POA: Diagnosis not present

## 2019-11-30 DIAGNOSIS — L821 Other seborrheic keratosis: Secondary | ICD-10-CM | POA: Diagnosis not present

## 2019-11-30 DIAGNOSIS — L570 Actinic keratosis: Secondary | ICD-10-CM | POA: Diagnosis not present

## 2019-12-13 ENCOUNTER — Other Ambulatory Visit: Payer: Self-pay | Admitting: Family Medicine

## 2019-12-13 DIAGNOSIS — N200 Calculus of kidney: Secondary | ICD-10-CM

## 2019-12-14 ENCOUNTER — Encounter: Payer: BC Managed Care – PPO | Attending: General Surgery | Admitting: Dietician

## 2019-12-14 ENCOUNTER — Encounter: Payer: Self-pay | Admitting: Dietician

## 2019-12-14 ENCOUNTER — Other Ambulatory Visit: Payer: Self-pay

## 2019-12-14 DIAGNOSIS — E669 Obesity, unspecified: Secondary | ICD-10-CM | POA: Diagnosis not present

## 2019-12-14 NOTE — Patient Instructions (Signed)
.   Prioritize protein and make sure you are meeting your daily goal o 60 grams for women, 80 grams for men  o Eat these foods first and always include a source of protein with meals/snacks  . Eat a variety of foods and balanced meals which include protein, non-starchy vegetables, and complex carbohydrates  . Aim for at least 64 ounces of fluid per day, with at least half being plain water  . Take your bariatric multivitamin and 3 calcium daily  . Aim for 150 minutes of physical activity per week  o Try to include strength training 2+ days throughout the week  

## 2019-12-14 NOTE — Progress Notes (Signed)
Bariatric Nutrition Follow-Up Visit Medical Nutrition Therapy  Appt Start Time: 7:25am   End Time: 7:50am  MyChart Visit  11 Months Post-Operative Sleeve Gastrectomy Surgery Surgery Date: 01/22/2019  Pt's Expectations of Surgery/ Goals:  to be more mobile (and keep up with granddaughter), relieve pain, and feel better Pt Reported Successes: more energy, able to weed eat the whole yard without stopping, no sciatic nerve pain, off most medications   NUTRITION ASSESSMENT  Anthropometrics  Start weight at [facility]: 278.7 lbs (date: 05/12/2018) Today's weight: 235 lbs  Body Composition Scale 02/06/2019 07/27/2019 12/14/2019  Weight  lbs 251.6 234.6 235  BMI 37.1 34.4 34.5  Total Body Fat  % 33 30.8 30.8     Visceral Fat 25 22 22   Fat-Free Mass  % 66.9 69.1 69.1     Total Body Water  % 47.9 50.1 50.1     Muscle-Mass  lbs 47.4 44.1 44.2  Body Fat Displacement --- --- ---         Torso  lbs 51.5 44.8 45         Left Leg  lbs 10.3 8.9 9         Right Leg  lbs 10.3 8.9 9         Left Arm  lbs 5.1 4.4 4.5         Right Arm  lbs 5.1 4.4 4.5    Lifestyle & Dietary Hx Patient states he tolerates all foods and fluids fine. Physically active throughout the week. States he still does not eat a lot at one time and finds himself feeling hungry throughout the day, about every 4 hours. States he would like to continue avoiding carbohydrates, generally does not eat bread, pasta, rice, potatoes, etc. May have mixed nuts, apples, or dark chocolate for snacks.   24-Hr Dietary Recall First Meal: 7 meatballs  Snack: Premier Protein shake  Second Meal: leftovers (or shrimp fajitas w/o tortillas + refried beans)  Snack: protein bar  Third Meal: meatloaf + green beans (or sushi)  Snack: - Beverages: water, water w/ , Body Armor Lyte  Estimated daily fluid intake: 2-3 L water per day plus other fluids  Estimated daily protein intake: 80 g Supplements: bariatric MVI, calcium (Tums), vitamin  D Current average weekly physical activity: walking, body workouts, yardwork   Post-Op Goals/ Signs/ Symptoms Using straws: no Drinking while eating: no Chewing/swallowing difficulties: no Changes in vision: no Changes to mood/headaches: no Hair loss/changes to skin/nails: no  Difficulty focusing/concentrating: no Sweating: no Dizziness/lightheadedness: no Palpitations: no  Carbonated/caffeinated beverages: no N/V/D/C/Gas: no Abdominal pain: no Dumping syndrome: no   NUTRITION DIAGNOSIS  Overweight/obesity ([place]-3.3) related to past poor dietary habits and physical inactivity as evidenced by completed bariatric surgery and following dietary guidelines for continued weight loss and healthy nutrition status.   NUTRITION INTERVENTION Nutrition counseling (C-1) and education (E-2) to facilitate bariatric surgery goals, including: . Diet advancement to the next phase (phase 7) now including all foods . The importance of consuming adequate calories as well as certain nutrients daily due to the body's need for essential vitamins, minerals, and fats . The importance of daily physical activity and to reach a goal of at least 150 minutes of moderate to vigorous physical activity weekly (or as directed by their physician) due to benefits such as increased musculature and improved lab values  Handouts Provided Include   Bariatric MyPlate  Learning Style & Readiness for Change Teaching method utilized: Visual & Auditory  Demonstrated  degree of understanding via: Teach Back  Barriers to learning/adherence to lifestyle change: None Identified    MONITORING & EVALUATION Dietary intake, weekly physical activity, body weight, and goals PRN.  Next Steps Patient is to contact [facility] as needed for further nutrition follow up.

## 2020-04-11 ENCOUNTER — Other Ambulatory Visit: Payer: Self-pay | Admitting: Family Medicine

## 2020-04-24 DIAGNOSIS — G4733 Obstructive sleep apnea (adult) (pediatric): Secondary | ICD-10-CM | POA: Diagnosis not present

## 2020-05-29 ENCOUNTER — Encounter: Payer: Self-pay | Admitting: Family Medicine

## 2020-05-29 ENCOUNTER — Telehealth (INDEPENDENT_AMBULATORY_CARE_PROVIDER_SITE_OTHER): Payer: BC Managed Care – PPO | Admitting: Family Medicine

## 2020-05-29 DIAGNOSIS — G4733 Obstructive sleep apnea (adult) (pediatric): Secondary | ICD-10-CM | POA: Diagnosis not present

## 2020-05-29 DIAGNOSIS — Z9989 Dependence on other enabling machines and devices: Secondary | ICD-10-CM | POA: Diagnosis not present

## 2020-05-29 NOTE — Progress Notes (Addendum)
PATIENT: Donald Gilbert DOB: 1956/01/19  REASON FOR VISIT: follow up HISTORY FROM: patient  Virtual Visit via Telephone Note  I connected with Donald Gilbert on 05/29/20 at  3:30 PM EDT by telephone and verified that I am speaking with the correct person using two identifiers.   I discussed the limitations, risks, security and privacy concerns of performing an evaluation and management service by telephone and the availability of in person appointments. I also discussed with the patient that there may be a patient responsible charge related to this service. The patient expressed understanding and agreed to proceed.   History of Present Illness:  05/29/20 ALL:  Donald Gilbert is a 65 y.o. male here today for follow up for OSA on CPAP. He continues to do well on therapy. He has had more of a dry mouth recently. Has not adjusted heated humidity. He continues to maintain weight loss of about 50 pounds. He has been able to discontinue all medications with the exception of losartan. He is feeling well today and without complaints.       05/24/2019 ALL:  Donald Gilbert is a 65 y.o. male here today for follow up for OSA on CPAP. He reports that he is doing very well. He is status post bariatric sleeve in November 2020. He has lost approximately 55 pounds. He continues to tolerate CPAP therapy. He does feel that this significantly helps improve sleep quality. He denies any concerns with CPAP therapy today. He does mention that he is currently working out of town. He is only sleeping about 4 hours a night. He feels that there are times where he forgets what he wants to say. 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 He denies any concerns mentioned by his family. He is able to perform all ADLs and drives without difficulty.  Compliance report dated 04/22/2019 through 05/21/2019 reveals that he used CPAP 30 of the past 30 days for compliance of 100%.  23 days he used CPAP  greater than 4 hours for compliance of 77%.  Average usage was 4 hours and 50 minutes.  Residual AHI was 0.4 on 9 cm of water and an EPR of 3.  There was no significant leak noted.   Observations/Objective:  Generalized: Well developed, in no acute distress  Mentation: Alert oriented to time, place, history taking. Follows all commands speech and language fluent   Assessment and Plan:  65 y.o. year old male  has a past medical history of Allergy, Arthralgia of multiple joints (04/03/2017), Arthritis, Atopic dermatitis (04/03/2017), BPPV (benign paroxysmal positional vertigo), Chickenpox, Constipation, Diabetes mellitus without complication (HCC), Difficult intubation, Dysthymia (04/03/2017), History of kidney stones, Hyperlipidemia, Hypertension, Insulin resistance (04/03/2017), Nasal polyp, OSA on CPAP, Sleep apnea, Urinary hesitancy (04/03/2017), and Vitamin D deficiency (04/03/2017). here with    ICD-10-CM   1. OSA on CPAP  G47.33 For home use only DME continuous positive airway pressure (CPAP)   Z99.89     He continues to do well on CPAP therapy. He was advised to continue nightly and 4 hour use. We have discussed sleep study results from 2019. He may consider retesting as he has lost 50 pounds. Split night study showed AHI 51/hr with no significant hypoxemia. Supply orders sent. He may adjust humidity as needed. He will follow up in 1 year, sooner if needed.   Orders Placed This Encounter  Procedures  . For home use only DME continuous positive airway pressure (CPAP)  Supplies    Order Specific Question:   Length of Need    Answer:   Lifetime    Order Specific Question:   Patient has OSA or probable OSA    Answer:   Yes    Order Specific Question:   Is the patient currently using CPAP in the home    Answer:   Yes    Order Specific Question:   Settings    Answer:   Other see comments    Order Specific Question:   CPAP supplies needed    Answer:   Mask, headgear, cushions, filters,  heated tubing and water chamber    No orders of the defined types were placed in this encounter.    Follow Up Instructions:  I discussed the assessment and treatment plan with the patient. The patient was provided an opportunity to ask questions and all were answered. The patient agreed with the plan and demonstrated an understanding of the instructions.   The patient was advised to call back or seek an in-person evaluation if the symptoms worsen or if the condition fails to improve as anticipated.  I provided 15 minutes of non-face-to-face time during this encounter. Patient located at their place of residence during Mychart visit. Provider is in the office.    Shawnie Dapper, NP   I reviewed the above note and documentation by the Nurse Practitioner and agree with the history, exam, assessment and plan as outlined above. I was available for consultation. Huston Foley, MD, PhD [facility])

## 2020-06-10 NOTE — Progress Notes (Signed)
Cm sent to [facility] 

## 2020-07-25 DIAGNOSIS — G4733 Obstructive sleep apnea (adult) (pediatric): Secondary | ICD-10-CM | POA: Diagnosis not present

## 2020-08-19 ENCOUNTER — Encounter (HOSPITAL_COMMUNITY): Payer: Self-pay | Admitting: *Deleted

## 2020-09-05 DIAGNOSIS — Z9884 Bariatric surgery status: Secondary | ICD-10-CM | POA: Diagnosis not present

## 2020-09-05 DIAGNOSIS — R69 Illness, unspecified: Secondary | ICD-10-CM | POA: Diagnosis not present

## 2020-09-05 DIAGNOSIS — K912 Postsurgical malabsorption, not elsewhere classified: Secondary | ICD-10-CM | POA: Diagnosis not present

## 2020-09-05 LAB — COMPREHENSIVE METABOLIC PANEL
Albumin: 4.5 (ref 3.5–5.0)
Calcium: 9.7 (ref 8.7–10.7)
GFR calc Af Amer: 107
GFR calc non Af Amer: 92
Globulin: 2.4

## 2020-09-05 LAB — CBC AND DIFFERENTIAL
HCT: 47 (ref 41–53)
Hemoglobin: 15.7 (ref 13.5–17.5)
Platelets: 217 (ref 150–399)
WBC: 5.7

## 2020-09-05 LAB — BASIC METABOLIC PANEL
CO2: 28 — AB (ref 13–22)
Chloride: 104 (ref 99–108)
Creatinine: 0.9 (ref 0.6–1.3)
Glucose: 73
Potassium: 4 (ref 3.4–5.3)
Sodium: 140 (ref 137–147)

## 2020-09-05 LAB — IRON,TIBC AND FERRITIN PANEL: Iron: 73

## 2020-09-05 LAB — CBC: RBC: 5.46 — AB (ref 3.87–5.11)

## 2020-09-05 LAB — HEPATIC FUNCTION PANEL
ALT: 16 (ref 10–40)
AST: 14 (ref 14–40)
Alkaline Phosphatase: 74 (ref 25–125)
Bilirubin, Total: 0.8

## 2020-09-05 LAB — VITAMIN D 25 HYDROXY (VIT D DEFICIENCY, FRACTURES): Vit D, 25-Hydroxy: 47

## 2020-09-08 ENCOUNTER — Other Ambulatory Visit: Payer: Self-pay | Admitting: Family Medicine

## 2020-09-08 DIAGNOSIS — N200 Calculus of kidney: Secondary | ICD-10-CM

## 2020-10-08 ENCOUNTER — Other Ambulatory Visit: Payer: Self-pay | Admitting: Family Medicine

## 2020-10-10 ENCOUNTER — Ambulatory Visit (INDEPENDENT_AMBULATORY_CARE_PROVIDER_SITE_OTHER): Payer: BC Managed Care – PPO | Admitting: Family Medicine

## 2020-10-10 ENCOUNTER — Other Ambulatory Visit: Payer: Self-pay

## 2020-10-10 ENCOUNTER — Encounter: Payer: Self-pay | Admitting: Family Medicine

## 2020-10-10 VITALS — BP 148/81 | HR 69 | Temp 98.2°F | Ht 69.0 in | Wt 247.2 lb

## 2020-10-10 DIAGNOSIS — I10 Essential (primary) hypertension: Secondary | ICD-10-CM

## 2020-10-10 DIAGNOSIS — R7303 Prediabetes: Secondary | ICD-10-CM

## 2020-10-10 DIAGNOSIS — E782 Mixed hyperlipidemia: Secondary | ICD-10-CM

## 2020-10-10 DIAGNOSIS — Z9989 Dependence on other enabling machines and devices: Secondary | ICD-10-CM

## 2020-10-10 DIAGNOSIS — G4733 Obstructive sleep apnea (adult) (pediatric): Secondary | ICD-10-CM

## 2020-10-10 DIAGNOSIS — N200 Calculus of kidney: Secondary | ICD-10-CM

## 2020-10-10 DIAGNOSIS — J339 Nasal polyp, unspecified: Secondary | ICD-10-CM

## 2020-10-10 DIAGNOSIS — N529 Male erectile dysfunction, unspecified: Secondary | ICD-10-CM

## 2020-10-10 LAB — LIPID PANEL
Cholesterol: 200 mg/dL (ref 0–200)
HDL: 37 mg/dL — ABNORMAL LOW (ref >39.00)
NonHDL: 163.19
Total CHOL/HDL Ratio: 5
Triglycerides: 248 mg/dL — ABNORMAL HIGH (ref 0.0–149.0)
VLDL: 49.6 mg/dL — ABNORMAL HIGH (ref 0.0–40.0)

## 2020-10-10 LAB — LDL CHOLESTEROL, DIRECT: Direct LDL: 143 mg/dL

## 2020-10-10 LAB — HEMOGLOBIN A1C: Hgb A1c MFr Bld: 5.4 % (ref 4.6–6.5)

## 2020-10-10 MED ORDER — ALLOPURINOL 300 MG PO TABS: ORAL_TABLET | ORAL | 3 refills | Status: DC

## 2020-10-10 MED ORDER — TADALAFIL 5 MG PO TABS: 5.0000 mg | ORAL_TABLET | Freq: Every day | ORAL | 0 refills | Status: AC | PRN

## 2020-10-10 MED ORDER — LOSARTAN POTASSIUM 100 MG PO TABS: 100.0000 mg | ORAL_TABLET | Freq: Every day | ORAL | 3 refills | Status: DC

## 2020-10-10 MED ORDER — ATORVASTATIN CALCIUM 40 MG PO TABS: 40.0000 mg | ORAL_TABLET | Freq: Every day | ORAL | 3 refills | Status: DC

## 2020-10-10 NOTE — Progress Notes (Signed)
Please inform patient of the following:  Cholesterol level is elevated.  Would be a good idea to start Lipitor 40 mg daily to improve his numbers and low risk of heart attack and stroke.  Please send in if he is willing to start.  He should continue working on diet and exercise and we can recheck in a year.

## 2020-10-10 NOTE — Patient Instructions (Signed)
It was very nice to see you today!  We will send medications to your pharmacy.  We will check your cholesterol level today.  I will place referral to ear nose and throat to look at your nose.  Please check about getting her shingles vaccine at the pharmacy.  I will see you back in year.  Come back to see me sooner if needed.  Take care, Dr [name]  PLEASE NOTE:  If you had any lab tests please let us know if you have not heard back within a few days. You may see your results on mychart before we have a chance to review them but we will give you a call once they are reviewed by [place]. If we ordered any referrals today, please let us know if you have not heard from their office within the next week.   Please try these tips to maintain a healthy lifestyle:  Eat at least 3 REAL meals and 1-2 snacks per day.  Aim for no more than 5 hours between eating.  If you eat breakfast, please do so within one hour of getting up.   Each meal should contain half fruits/vegetables, one quarter protein, and one quarter carbs (no bigger than a computer mouse)  Cut down on sweet beverages. This includes juice, soda, and sweet tea.  + Drink at least 1 glass of water with each meal and aim for at least 8 glasses per day  Exercise at least 150 minutes every week.

## 2020-10-10 NOTE — Assessment & Plan Note (Signed)
Check lipids 

## 2020-10-10 NOTE — Assessment & Plan Note (Signed)
Slightly above goal though typically well controlled at home.  Continue losartan 100 mg daily.

## 2020-10-10 NOTE — Progress Notes (Signed)
   Donald Gilbert is a 65 y.o. male who presents today for an office visit.  Assessment/Plan:  Chronic Problems Addressed Today: Prediabetes Check A1c.   Erectile dysfunction Discussed treatment options.  We will start Cialis 5 mg daily as needed.  Discussed potential side effects.  OSA on CPAP Doing well with CPAP.  He has some nasal polyps that have given him issue in the past.  Will place referral to ENT for evaluation.  Recurrent kidney stones Stable.  Continue allopurinol 300 mg daily.  Mixed hyperlipidemia Check lipids.  Essential hypertension   Slightly above goal though typically well controlled at home.  Continue losartan 100 mg daily.  Preventative Healthcare Advised him to get shingles vaccine at pharmacy.  He is up-to-date on other vaccines and screenings.    Subjective:  HPI:  See A/p.         Objective:  Physical Exam: BP (!) 148/81   Pulse 69   Temp 98.2 F (36.8 C) (Temporal)   Ht 5\' 9"  (1.753 m)   Wt 247 lb 3.2 oz (112.1 kg)   SpO2 97%   BMI 36.51 kg/m   Gen: No acute distress, resting comfortably CV: Regular rate and rhythm with no murmurs appreciated Pulm: Normal work of breathing, clear to auscultation bilaterally with no crackles, wheezes, or rhonchi Neuro: Grossly normal, moves all extremities Psych: Normal affect and thought content      Donald Gilbert M. , MD 10/10/2020 8:46 AM

## 2020-10-10 NOTE — Assessment & Plan Note (Signed)
Discussed treatment options.  We will start Cialis 5 mg daily as needed.  Discussed potential side effects.

## 2020-10-10 NOTE — Assessment & Plan Note (Signed)
Check A1c. 

## 2020-10-10 NOTE — Assessment & Plan Note (Signed)
Stable.  Continue allopurinol 300 mg daily.

## 2020-10-10 NOTE — Assessment & Plan Note (Signed)
Doing well with CPAP.  He has some nasal polyps that have given him issue in the past.  Will place referral to ENT for evaluation.

## 2020-10-23 ENCOUNTER — Encounter: Payer: Self-pay | Admitting: Family Medicine

## 2020-10-23 DIAGNOSIS — G4733 Obstructive sleep apnea (adult) (pediatric): Secondary | ICD-10-CM | POA: Diagnosis not present

## 2020-10-30 ENCOUNTER — Ambulatory Visit (INDEPENDENT_AMBULATORY_CARE_PROVIDER_SITE_OTHER): Payer: BC Managed Care – PPO | Admitting: Otolaryngology

## 2020-10-30 ENCOUNTER — Other Ambulatory Visit: Payer: Self-pay

## 2020-10-30 DIAGNOSIS — J343 Hypertrophy of nasal turbinates: Secondary | ICD-10-CM

## 2020-10-30 DIAGNOSIS — J310 Chronic rhinitis: Secondary | ICD-10-CM

## 2020-10-30 DIAGNOSIS — J339 Nasal polyp, unspecified: Secondary | ICD-10-CM

## 2020-10-30 MED ORDER — TRIAMCINOLONE ACETONIDE 55 MCG/ACT NA AERO: 2.0000 | INHALATION_SPRAY | Freq: Every day | NASAL | 12 refills | Status: DC

## 2020-10-30 NOTE — Progress Notes (Signed)
HPI: Donald Gilbert is a 65 y.o. male who presents is referred by his PCP Dr. [name] for evaluation of chronic nasal obstruction.  Patient is on CPAP.  He was told previously several years ago while in [place] that he had nasal polyps.  He uses Afrin on a regular basis every night in order to sleep.  He has chronic nasal obstruction which is much worse at night.  He has used nasal steroid sprays in the past but not on a regular basis..  Past Medical History:  Diagnosis Date   Allergy    mild    Arthralgia of multiple joints 04/03/2017   Arthritis    Atopic dermatitis 04/03/2017   BPPV (benign paroxysmal positional vertigo)    Chickenpox    Constipation    maybe twice a week due to job    Diabetes mellitus without complication (HCC)    type 2   Difficult intubation    Dysthymia 04/03/2017   History of kidney stones    recurrent   Hyperlipidemia    Hypertension    Insulin resistance 04/03/2017   Nasal polyp    OSA on CPAP    Sleep apnea    wears cpap   11cm H20   Urinary hesitancy 04/03/2017   Vitamin D deficiency 04/03/2017   Past Surgical History:  Procedure Laterality Date   COLONOSCOPY  03/2008   no precancerous polyps   COLONOSCOPY WITH PROPOFOL N/A 10/02/2018   Procedure: COLONOSCOPY WITH PROPOFOL;  Surgeon: Sherrilyn Rist, MD;  Location: [facility];  Service: Gastroenterology;  Laterality: N/A;   LAPAROSCOPIC GASTRIC SLEEVE RESECTION N/A 01/22/2019   Procedure: LAPAROSCOPIC GASTRIC SLEEVE RESECTION AND HIATAL HERNIA REPAIR, Upper Endo, Eras Pathway;  Surgeon: Gaynelle Adu, MD;  Location: [facility];  Service: General;  Laterality: N/A;   LASIK     TONSILLECTOMY     UMBILICAL HERNIA REPAIR     2016   uvula removed      Social History   Socioeconomic History   Marital status: Married    Spouse name: Not on file   Number of children: Not on file   Years of education: Not on file   Highest education level: Not on file  Occupational History    Employer: Engineer, structural  Tobacco Use   Smoking status: Former   Smokeless tobacco: Former    Types: Chew    Quit date: 2001  Vaping Use   Vaping Use: Never used  Substance and Sexual Activity   Alcohol use: Not Currently    Comment: OCC    Drug use: No   Sexual activity: Yes  Other Topics Concern   Not on file  Social History Narrative   Not on file   Social Determinants of Health   Financial Resource Strain: Not on file  Food Insecurity: Not on file  Transportation Needs: Not on file  Physical Activity: Not on file  Stress: Not on file  Social Connections: Not on file   Family History  Problem Relation Age of Onset   Hypertension Mother    Colon cancer Neg Hx    Colon polyps Neg Hx    Esophageal cancer Neg Hx    Rectal cancer Neg Hx    Stomach cancer Neg Hx    No Known Allergies Prior to Admission medications   Medication Sig Start Date End Date Taking? Authorizing Provider  allopurinol (ZYLOPRIM) 300 MG tablet TAKE 1 TABLET(300 MG) BY MOUTH DAILY 10/10/20  Ardith Dark, MD  atorvastatin (LIPITOR) 40 MG tablet Take 1 tablet (40 mg total) by mouth daily. 10/10/20   Ardith Dark, MD  calcium carbonate (TUMS - DOSED IN MG ELEMENTAL CALCIUM) 500 MG chewable tablet Chew 1 tablet by mouth 3 (three) times daily with meals.    [provider]  losartan (COZAAR) 100 MG tablet Take 1 tablet (100 mg total) by mouth daily. 10/10/20   Ardith Dark, MD  Multiple Vitamin (MULTIVITAMIN WITH MINERALS) TABS tablet Take 1 tablet by mouth daily.    [provider]  tadalafil (CIALIS) 5 MG tablet Take 1 tablet (5 mg total) by mouth daily as needed for erectile dysfunction. 10/10/20   Ardith Dark, MD     Positive ROS: Otherwise negative  All other systems have been reviewed and were otherwise negative with the exception of those mentioned in the HPI and as above.  Physical Exam: Constitutional: Alert, well-appearing, no acute distress Ears: External ears without  lesions or tenderness. Ear canals are clear bilaterally with intact, clear TMs.  Nasal: External nose without lesions. Septum with only mild deformity moderate rhinitis with swollen mucous membranes throughout.  On nasopharyngoscopy the right nasal cavity was clear but patient has a posterior left nasal polyp which is medial to the middle turbinate posteriorly.  No mucopurulent discharge noted and no signs of infection.. Oral: Lips and gums without lesions. Tongue and palate mucosa without lesions. Posterior oropharynx clear. Neck: No palpable adenopathy or masses Respiratory: Breathing comfortably  Skin: No facial/neck lesions or rash noted.  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  Plan: We will plan on obtaining a CT scan of his sinuses to evaluate the degree of sinonasal polyps.  He would probably benefit from having turbinate reductions and polyps removed as this was significantly help improve his nasal airway and breathing especially at night since he has sleep apnea uses CPAP. Also strongly recommended that he use nasal steroid spray on a regular basis and will call in Nasacort 2 sprays each nostril at night to use and to try to get off of the Afrin. He will follow-up in 3 to 4 weeks for recheck.   Narda Bonds, MD   CC:

## 2020-10-31 ENCOUNTER — Other Ambulatory Visit (INDEPENDENT_AMBULATORY_CARE_PROVIDER_SITE_OTHER): Payer: Self-pay | Admitting: Otolaryngology

## 2020-10-31 DIAGNOSIS — J339 Nasal polyp, unspecified: Secondary | ICD-10-CM

## 2020-11-03 ENCOUNTER — Other Ambulatory Visit (INDEPENDENT_AMBULATORY_CARE_PROVIDER_SITE_OTHER): Payer: Self-pay | Admitting: Otolaryngology

## 2020-11-04 ENCOUNTER — Other Ambulatory Visit (INDEPENDENT_AMBULATORY_CARE_PROVIDER_SITE_OTHER): Payer: Self-pay | Admitting: Otolaryngology

## 2020-11-20 ENCOUNTER — Ambulatory Visit (INDEPENDENT_AMBULATORY_CARE_PROVIDER_SITE_OTHER): Payer: BC Managed Care – PPO | Admitting: Otolaryngology

## 2020-11-22 ENCOUNTER — Ambulatory Visit
Admission: RE | Admit: 2020-11-22 | Discharge: 2020-11-22 | Disposition: A | Discharge status: Home / Self Care | Payer: BC Managed Care – PPO | Source: Ambulatory Visit | Attending: Otolaryngology | Admitting: Otolaryngology

## 2020-11-22 DIAGNOSIS — Z981 Arthrodesis status: Secondary | ICD-10-CM | POA: Diagnosis not present

## 2020-11-22 DIAGNOSIS — J339 Nasal polyp, unspecified: Secondary | ICD-10-CM

## 2020-11-22 DIAGNOSIS — J0100 Acute maxillary sinusitis, unspecified: Secondary | ICD-10-CM | POA: Diagnosis not present

## 2020-11-22 DIAGNOSIS — J3489 Other specified disorders of nose and nasal sinuses: Secondary | ICD-10-CM | POA: Diagnosis not present

## 2020-11-22 DIAGNOSIS — J342 Deviated nasal septum: Secondary | ICD-10-CM | POA: Diagnosis not present

## 2020-12-01 ENCOUNTER — Ambulatory Visit (INDEPENDENT_AMBULATORY_CARE_PROVIDER_SITE_OTHER): Payer: BC Managed Care – PPO | Admitting: Otolaryngology

## 2020-12-01 ENCOUNTER — Other Ambulatory Visit: Payer: Self-pay

## 2020-12-01 DIAGNOSIS — J310 Chronic rhinitis: Secondary | ICD-10-CM

## 2020-12-01 DIAGNOSIS — J3489 Other specified disorders of nose and nasal sinuses: Secondary | ICD-10-CM | POA: Diagnosis not present

## 2020-12-01 NOTE — Progress Notes (Signed)
HPI: Donald Gilbert is a 65 y.o. male who returns today for evaluation of nasal obstruction and review of recent CT scan.  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  He had previously had polyps removed several years ago in [place] from his nasal cavity. Since last visit I discussed with him concerning weaning off of the decongestant spray and regular use of nasal steroid spray Nasacort or Flonase 2 sprays each nostril at night. He was unable to make a follow-up visit at the scheduled time and wanted to review the CT scan on the telephone. Reviewed the CT scan with the patient which showed minimal sinus disease.  He still has mild to moderate septal deviation to the right with a septal spur posteriorly on the right.  But no intranasal polyps or significant intranasal obstruction noted as he has been only mild edema of the nasal mucosa.  No obstructive lesions were noted and nasopharynx was clear.  Mild mucosal thickening within the sinuses but no significant opacification or evidence of nasal polyps..  Past Medical History:  Diagnosis Date  . Allergy    mild   . Arthralgia of multiple joints 04/03/2017  . Arthritis   . Atopic dermatitis 04/03/2017  . BPPV (benign paroxysmal positional vertigo)   . Chickenpox   . Constipation    maybe twice a week due to job   . Diabetes mellitus without complication (HCC)    type 2  . Difficult intubation   . Dysthymia 04/03/2017  . History of kidney stones    recurrent  . Hyperlipidemia   . Hypertension   . Insulin resistance 04/03/2017  . Nasal polyp   . OSA on CPAP   . Sleep apnea    wears cpap   11cm H20  . Urinary hesitancy 04/03/2017  . Vitamin D deficiency 04/03/2017   Past Surgical History:  Procedure Laterality Date  . COLONOSCOPY  03/2008   no precancerous polyps  . COLONOSCOPY WITH PROPOFOL N/A 10/02/2018    Procedure: COLONOSCOPY WITH PROPOFOL;  Surgeon: Sherrilyn Rist, MD;  Location: [facility];  Service: Gastroenterology;  Laterality: N/A;  . LAPAROSCOPIC GASTRIC SLEEVE RESECTION N/A 01/22/2019   Procedure: LAPAROSCOPIC GASTRIC SLEEVE RESECTION AND HIATAL HERNIA REPAIR, Upper Endo, Eras Pathway;  Surgeon: Gaynelle Adu, MD;  Location: [facility];  Service: General;  Laterality: N/A;  . LASIK    . TONSILLECTOMY    . UMBILICAL HERNIA REPAIR     2016  . uvula removed      Social History   Socioeconomic History  . Marital status: Married    Spouse name: Not on file  . Number of children: Not on file  . Years of education: Not on file  . Highest education level: Not on file  Occupational History    Employer: Educational psychologist  Tobacco Use  . Smoking status: Former  . Smokeless tobacco: Former    Types: Dorna Bloom    Quit date: 2001  Advertising account planner  . Vaping Use: Never used  Substance and Sexual Activity  . Alcohol use: Not Currently    Comment: OCC   . Drug use: No  . Sexual activity: Yes  Other Topics Concern  . Not on file  Social History Narrative  . Not on file   Social Determinants of Health   Financial Resource Strain: Not on file  Food Insecurity: Not on file  Transportation Needs: Not on file  Physical Activity: Not on file  Stress: Not on file  Social Connections: Not on file   Family History  Problem Relation Age of Onset  . Hypertension Mother   . Colon cancer Neg Hx   . Colon polyps Neg Hx   . Esophageal cancer Neg Hx   . Rectal cancer Neg Hx   . Stomach cancer Neg Hx    No Known Allergies Prior to Admission medications   Medication Sig Start Date End Date Taking? Authorizing Provider  allopurinol (ZYLOPRIM) 300 MG tablet TAKE 1 TABLET(300 MG) BY MOUTH DAILY 10/10/20   Ardith Dark, MD  atorvastatin (LIPITOR) 40 MG tablet Take 1 tablet (40 mg total) by mouth daily. 10/10/20   Ardith Dark, MD  calcium carbonate (TUMS - DOSED IN MG ELEMENTAL  CALCIUM) 500 MG chewable tablet Chew 1 tablet by mouth 3 (three) times daily with meals.    [provider]  losartan (COZAAR) 100 MG tablet Take 1 tablet (100 mg total) by mouth daily. 10/10/20   Ardith Dark, MD  Multiple Vitamin (MULTIVITAMIN WITH MINERALS) TABS tablet Take 1 tablet by mouth daily.    [provider]  tadalafil (CIALIS) 5 MG tablet Take 1 tablet (5 mg total) by mouth daily as needed for erectile dysfunction. 10/10/20   Ardith Dark, MD  triamcinolone (NASACORT) 55 MCG/ACT AERO nasal inhaler Place 2 sprays into the nose daily. 2 sprays each nostril at night 10/30/20   Drema Halon, MD     Positive ROS: Patient overall is breathing better.  All other systems have been reviewed and were otherwise negative with the exception of those mentioned in the HPI and as above.  Physical Exam: Constitutional: Alert, well-appearing, no acute distress Ears: External ears without lesions or tenderness. Ear canals are clear bilaterally with intact, clear TMs.  Nasal: External nose without lesions.. Clear nasal passages Oral: Lips and gums without lesions. Tongue and palate mucosa without lesions. Posterior oropharynx clear. Neck: No palpable adenopathy or masses Respiratory: Breathing comfortably  Skin: No facial/neck lesions or rash noted.  Procedures  Assessment: Chronic rhinitis with minimal sinus disease  Plan: Would recommend regular use of nasal steroid spray 2 sprays each nostril at night and avoiding use of decongestant spray. If he continues to have chronic nasal obstruction could consider surgical intervention at a later date but would recommend follow-up with another ENT doctor as I will be retiring in 6 weeks.   Narda Bonds, MD

## 2020-12-11 ENCOUNTER — Ambulatory Visit (INDEPENDENT_AMBULATORY_CARE_PROVIDER_SITE_OTHER): Payer: BC Managed Care – PPO | Admitting: Otolaryngology

## 2021-01-23 DIAGNOSIS — G4733 Obstructive sleep apnea (adult) (pediatric): Secondary | ICD-10-CM | POA: Diagnosis not present

## 2021-03-11 ENCOUNTER — Ambulatory Visit: Payer: Self-pay

## 2021-03-11 ENCOUNTER — Ambulatory Visit (INDEPENDENT_AMBULATORY_CARE_PROVIDER_SITE_OTHER): Payer: BC Managed Care – PPO | Admitting: Family Medicine

## 2021-03-11 ENCOUNTER — Encounter: Payer: Self-pay | Admitting: Family Medicine

## 2021-03-11 ENCOUNTER — Other Ambulatory Visit: Payer: Self-pay

## 2021-03-11 ENCOUNTER — Ambulatory Visit (INDEPENDENT_AMBULATORY_CARE_PROVIDER_SITE_OTHER): Payer: BC Managed Care – PPO

## 2021-03-11 VITALS — BP 138/84 | HR 76 | Ht 69.0 in | Wt 255.0 lb

## 2021-03-11 DIAGNOSIS — S46011A Strain of muscle(s) and tendon(s) of the rotator cuff of right shoulder, initial encounter: Secondary | ICD-10-CM | POA: Diagnosis not present

## 2021-03-11 DIAGNOSIS — M25511 Pain in right shoulder: Secondary | ICD-10-CM

## 2021-03-11 DIAGNOSIS — M542 Cervicalgia: Secondary | ICD-10-CM | POA: Diagnosis not present

## 2021-03-11 DIAGNOSIS — M75111 Incomplete rotator cuff tear or rupture of right shoulder, not specified as traumatic: Secondary | ICD-10-CM | POA: Insufficient documentation

## 2021-03-11 MED ORDER — MELOXICAM 7.5 MG PO TABS: 7.5000 mg | ORAL_TABLET | Freq: Every day | ORAL | 0 refills | Status: DC

## 2021-03-11 NOTE — Progress Notes (Signed)
[facility] [address] [place] [address] Phone: [phone] Subjective:   Bruce Donath, am serving as a scribe for Dr. Antoine Primas. This visit occurred during the SARS-CoV-2 public health emergency.  Safety protocols were in place, including screening questions prior to the visit, additional usage of staff PPE, and extensive cleaning of exam room while observing appropriate contact time as indicated for disinfecting solutions.  I'm seeing this patient by the request  of:  Ardith Dark, MD  CC: Right shoulder pain  URK:[id number]  Donald Gilbert is a 65 y.o. male coming in with complaint of R shoulder pain since yesterday when he was taking apart Christmas tree. Felt a pop in anterior shoulder. No radiating symptoms but has pain with movement. Little pain at rest. No history of injury to this area. Has successfully reduced his pain with Tylenol.        Past Medical History:  Diagnosis Date   Allergy    mild    Arthralgia of multiple joints 04/03/2017   Arthritis    Atopic dermatitis 04/03/2017   BPPV (benign paroxysmal positional vertigo)    Chickenpox    Constipation    maybe twice a week due to job    Diabetes mellitus without complication (HCC)    type 2   Difficult intubation    Dysthymia 04/03/2017   History of kidney stones    recurrent   Hyperlipidemia    Hypertension    Insulin resistance 04/03/2017   Nasal polyp    OSA on CPAP    Sleep apnea    wears cpap   11cm H20   Urinary hesitancy 04/03/2017   Vitamin D deficiency 04/03/2017   Past Surgical History:  Procedure Laterality Date   COLONOSCOPY  03/2008   no precancerous polyps   COLONOSCOPY WITH PROPOFOL N/A 10/02/2018   Procedure: COLONOSCOPY WITH PROPOFOL;  Surgeon: Sherrilyn Rist, MD;  Location: [facility];  Service: Gastroenterology;  Laterality: N/A;   LAPAROSCOPIC GASTRIC SLEEVE RESECTION N/A 01/22/2019   Procedure: LAPAROSCOPIC GASTRIC SLEEVE RESECTION  AND HIATAL HERNIA REPAIR, Upper Endo, Eras Pathway;  Surgeon: Gaynelle Adu, MD;  Location: [facility];  Service: General;  Laterality: N/A;   LASIK     TONSILLECTOMY     UMBILICAL HERNIA REPAIR     2016   uvula removed      Social History   Socioeconomic History   Marital status: Married    Spouse name: Not on file   Number of children: Not on file   Years of education: Not on file   Highest education level: Not on file  Occupational History    Employer: Educational psychologist  Tobacco Use   Smoking status: Former   Smokeless tobacco: Former    Types: Chew    Quit date: 2001  Vaping Use   Vaping Use: Never used  Substance and Sexual Activity   Alcohol use: Not Currently    Comment: OCC    Drug use: No   Sexual activity: Yes  Other Topics Concern   Not on file  Social History Narrative   Not on file   Social Determinants of Health   Financial Resource Strain: Not on file  Food Insecurity: Not on file  Transportation Needs: Not on file  Physical Activity: Not on file  Stress: Not on file  Social Connections: Not on file   No Known Allergies Family History  Problem Relation Age of Onset  Hypertension Mother    Colon cancer Neg Hx    Colon polyps Neg Hx    Esophageal cancer Neg Hx    Rectal cancer Neg Hx    Stomach cancer Neg Hx      Current Outpatient Medications (Cardiovascular):    atorvastatin (LIPITOR) 40 MG tablet, Take 1 tablet (40 mg total) by mouth daily.   losartan (COZAAR) 100 MG tablet, Take 1 tablet (100 mg total) by mouth daily.   tadalafil (CIALIS) 5 MG tablet, Take 1 tablet (5 mg total) by mouth daily as needed for erectile dysfunction.  Current Outpatient Medications (Respiratory):    triamcinolone (NASACORT) 55 MCG/ACT AERO nasal inhaler, Place 2 sprays into the nose daily. 2 sprays each nostril at night  Current Outpatient Medications (Analgesics):    allopurinol (ZYLOPRIM) 300 MG tablet, TAKE 1 TABLET(300 MG) BY MOUTH DAILY    meloxicam (MOBIC) 7.5 MG tablet, Take 1 tablet (7.5 mg total) by mouth daily.   Current Outpatient Medications (Other):    calcium carbonate (TUMS - DOSED IN MG ELEMENTAL CALCIUM) 500 MG chewable tablet, Chew 1 tablet by mouth 3 (three) times daily with meals.   Multiple Vitamin (MULTIVITAMIN WITH MINERALS) TABS tablet, Take 1 tablet by mouth daily.    Objective  Blood pressure 138/84, pulse 76, height 5\' 9"  (1.753 m), weight 255 lb (115.7 kg), SpO2 98 %.   General: No apparent distress alert and oriented x3 mood and affect normal, dressed appropriately.  HEENT: Pupils equal, extraocular movements intact  Respiratory: Patient's speak in full sentences and does not appear short of breath  Cardiovascular: No lower extremity edema, non tender, no erythema  Gait normal with good balance and coordination.  MSK: Right shoulder exam does show that patient does have positive impingement noted.  Rotator cuff appears to be intact with some mild weakness of the subscapularis.  Patient does have good range of motion noted.  Very mild positive crossover and mild positive speeds. Patient does have some limitation in range of motion of the neck noted today.  Limited muscular skeletal ultrasound was performed and interpreted by , M  Limited ultrasound of the right shoulder shows the patient does have a partial articular sided tear noted with no significant retraction of the subscapularis.  Supraspinatus appears to be intact but does have some hypoechoic changes noted.  Moderate acromioclavicular arthritic changes noted.  Trace effusion noted in this area. 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

## 2021-03-11 NOTE — Assessment & Plan Note (Signed)
Patient has what appears to be a partial tear of the rotator cuff.  Seems to be more of the subscapularis.  Supraspinatus appears to be intact.  Does have some underlying acromioclavicular arthritis and likely some arthritis of the neck that could be contributing to some of the muscle tightness.  Patient given a very short course of anti-inflammatory at 7.5.  Patient has had a sleeve gastrectomy and understands if it starts giving him more discomfort that he needs to discontinue immediately.  Patient can take the Tylenol otherwise.  Discussed topical anti-inflammatories and icing regimen.  We will hold on any injection at the moment.  Awaiting x-rays of the shoulder to rule out any bony abnormality that could be contributing.  Follow-up with me again in 4 to 8 weeks worsening pain consider formal physical therapy but highly optimistic patient should do well with conservative therapy

## 2021-03-11 NOTE — Patient Instructions (Addendum)
Xray today neck Meloxicam 7.5mg  for 5 days  Ok to take Tylenol with Meloxicam Stop if it hurts your stomach Voltaren and Ice 20 min See me in 4-5 weeks

## 2021-05-18 IMAGING — RF UPPER GI SERIES (WITHOUT KUB)
5 series · 14 of 19 positions shown · non-contrast
Comparison: None.

CLINICAL DATA: 62-year-old male presents for preoperative
evaluation for gastric sleeve surgery. Reported dysphagia.

EXAM:
UPPER GI SERIES WITH KUB
TECHNIQUE: After obtaining a scout radiograph a routine upper GI series was
performed using thin barium.
FLUOROSCOPY TIME:  Fluoroscopy Time:  2 minutes 42 seconds
Radiation Exposure Index (if provided by the fluoroscopic device):
517 mGy
Number of Acquired Spot Images: 8

[Series 1: one shot · 0.14mm/px · 6 of 8 slices shown (1 of 2)]
[im 1/8]
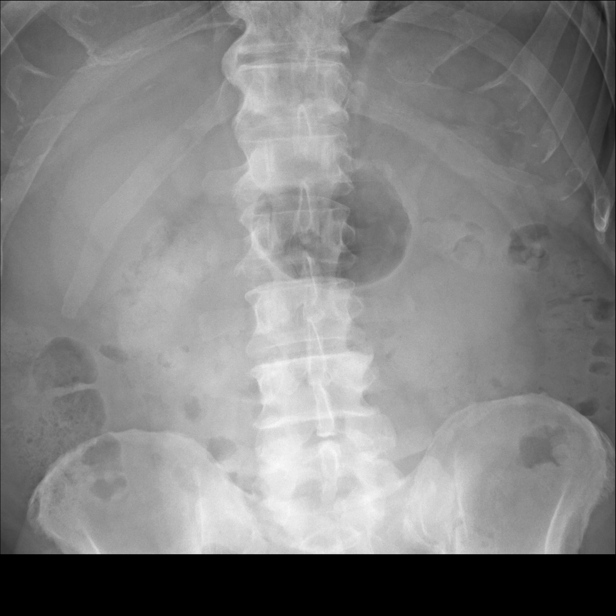
[im 3/8]
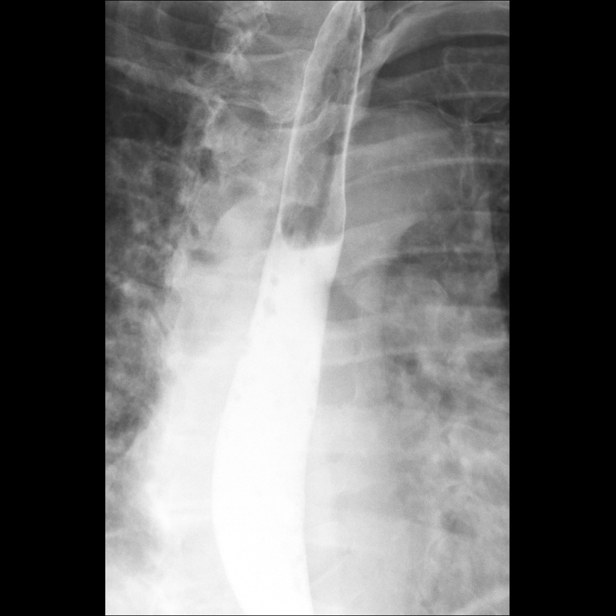
[im 4/8]
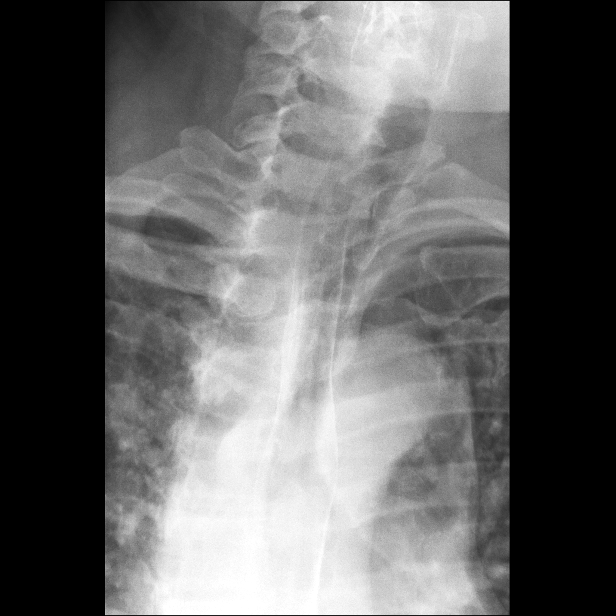
[im 5/8]
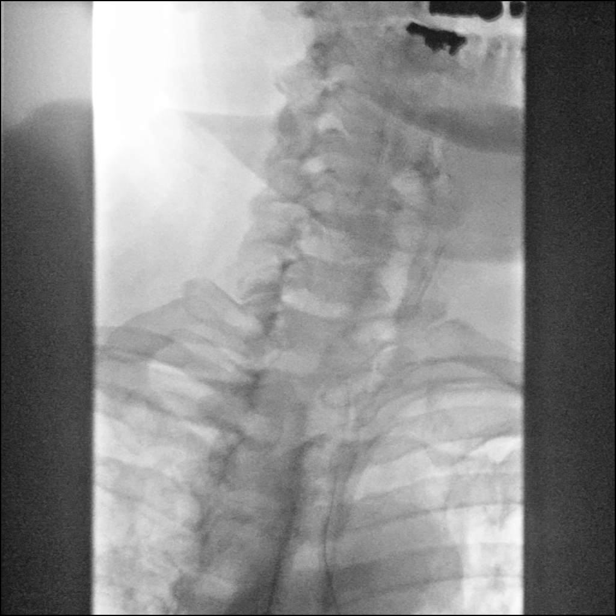
[im 7/8]
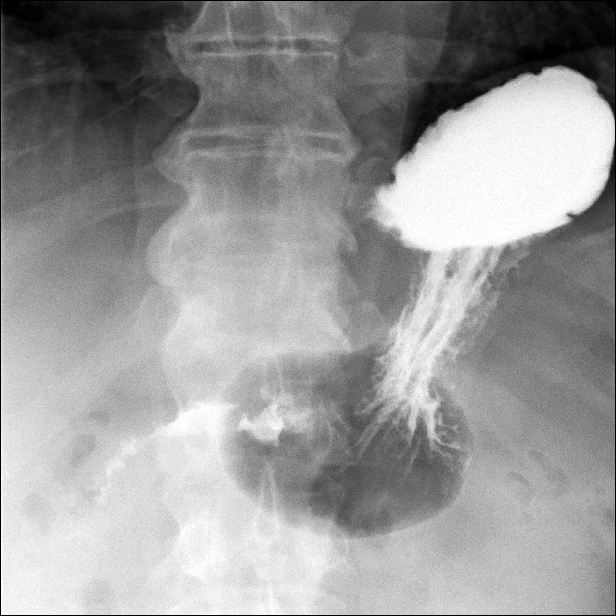
[im 8/8]
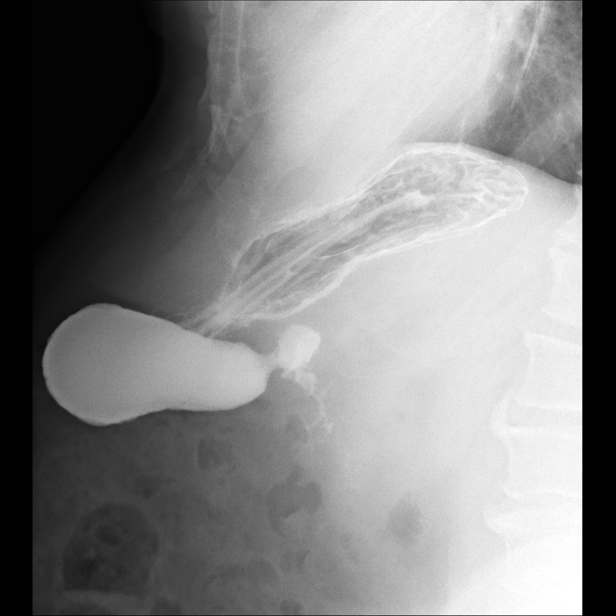

[Series 2: sequence · 2 of 37 frames shown (1 of 3)]
[frame 6/37]
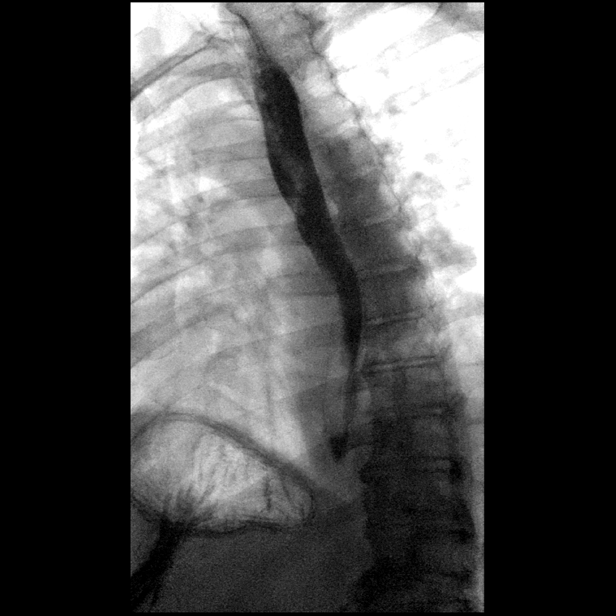
[frame 32/37]
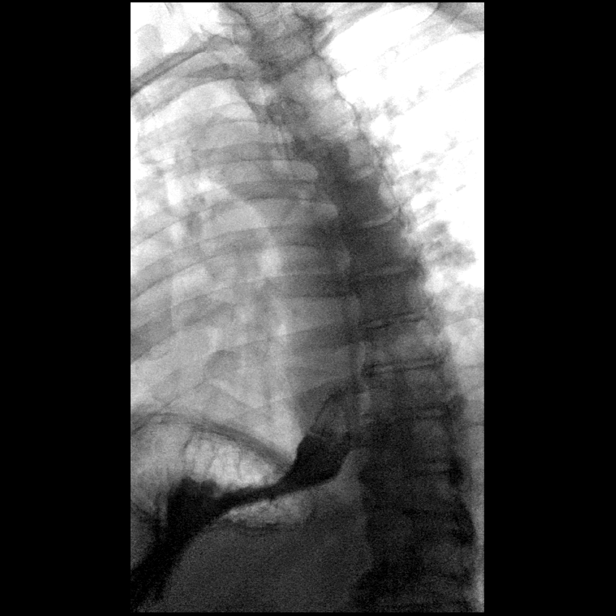

[Series 3: sequence · 2 of 112 frames shown (2 of 3)]
[frame 17/112]
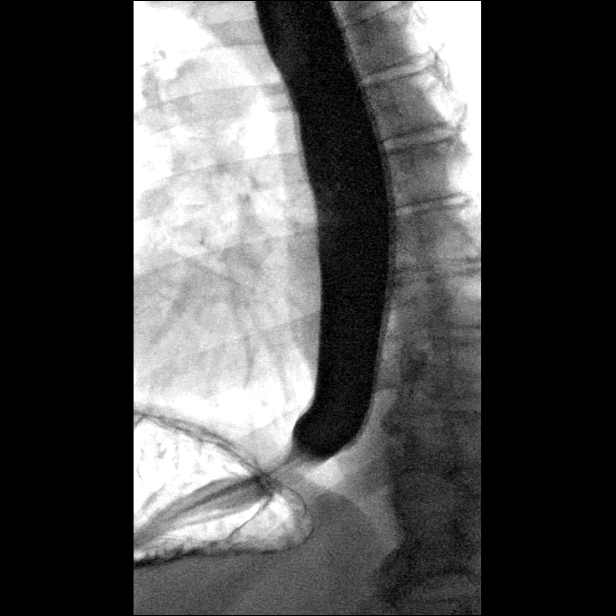
[frame 57/112]
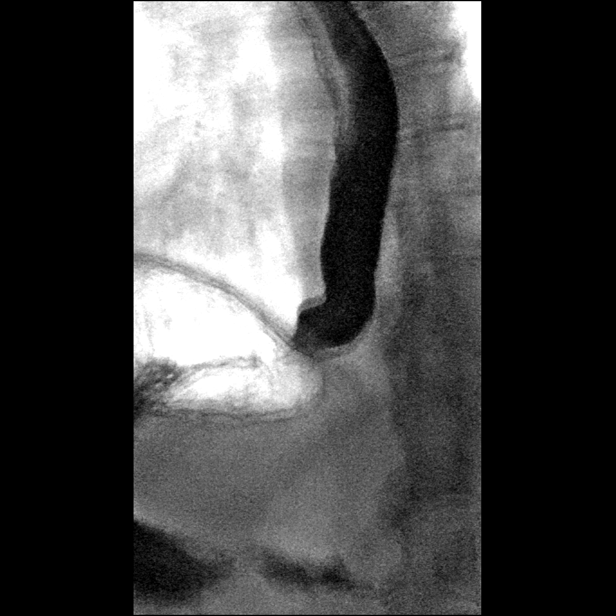

[Series 4: one shot · 0.15mm/px · 2 of 2 slices shown (2 of 2)]
[im 1/2]
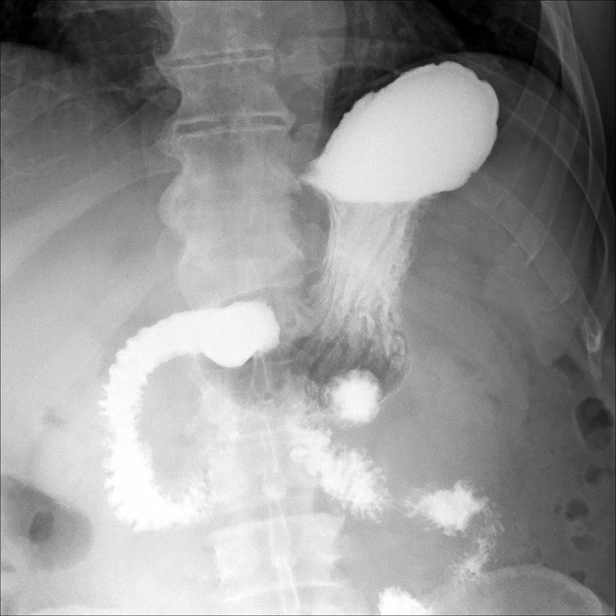
[im 2/2]
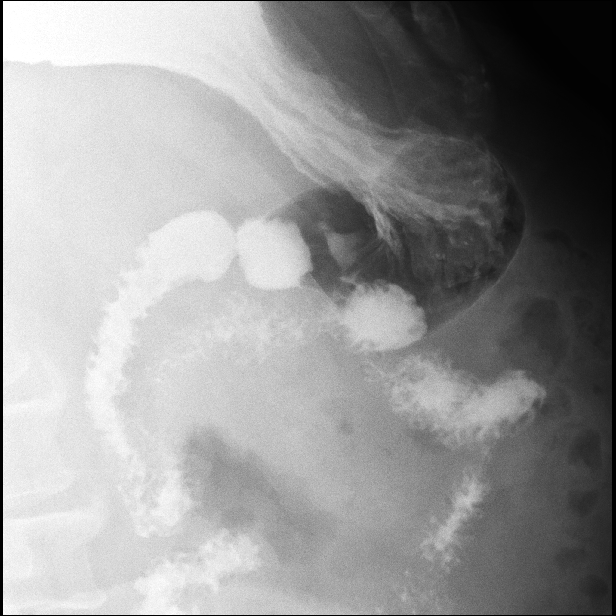

[Series 5: sequence · 2 of 43 frames shown (3 of 3)]
[frame 7/43]
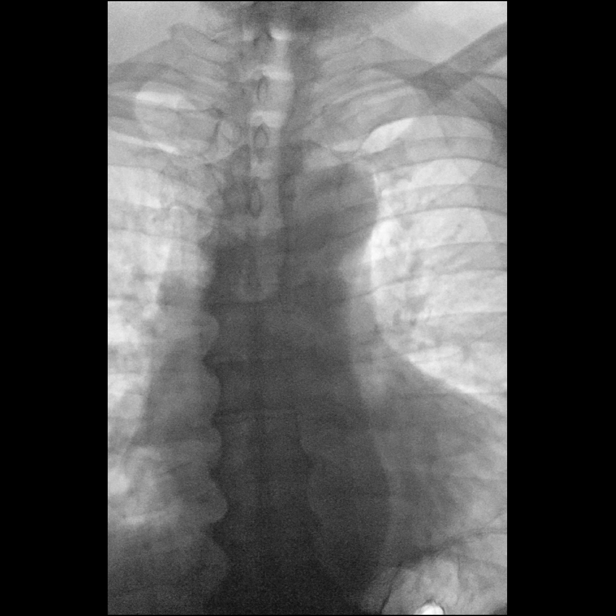
[frame 37/43]
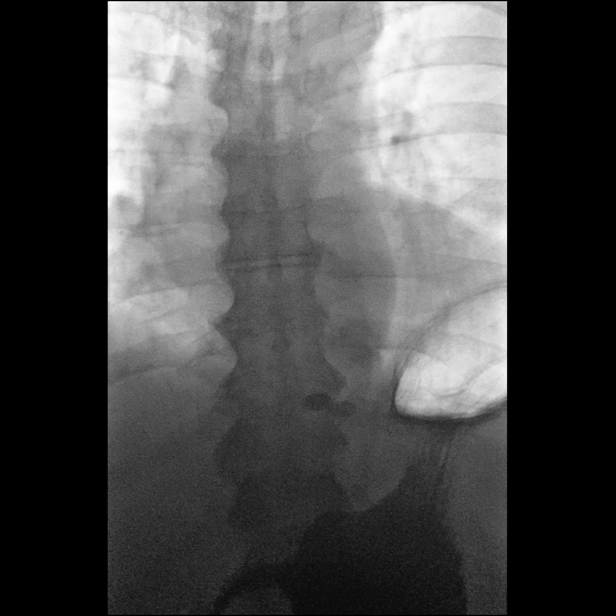

[14 of 19 positions shown; findings below may reference images not displayed]

FINDINGS: Scout radiograph demonstrates no dilated small bowel loops or
air-fluid levels. Mild colonic stool volume. No evidence of
pneumatosis or pneumoperitoneum. No radiopaque nephrolithiasis.

No hiatal hernia. No gastroesophageal reflux elicited, despite
provocative maneuvers including water siphon test. There is mild
esophageal dysmotility, characterized by intermittent mild weakening
of primary peristalsis in the lower thoracic esophagus. Normal
esophageal distensibility, with no evidence of esophageal mass,
stricture or ulcer. Barium tablet traversed the esophagus into the
stomach without delay.

Orthotopic left upper quadrant stomach. Normal gastric emptying and
peristalsis. No evidence of gastric fold thickening, filling defects
or ulcers. Normal duodenal bulb and C sweep, with no evidence of
duodenal fold thickening, strictures, filling defects or ulcers.
Normal duodenal jejunal junction to the left of the spine.
Visualized proximal jejunal loops are normal caliber and without
fold thickening.
IMPRESSION: 1. Mild esophageal dysmotility, with a pattern suggestive of chronic
reflux related dysmotility.
2. Otherwise normal upper GI. No hiatal hernia. No gastroesophageal
reflux elicited.

## 2021-07-03 ENCOUNTER — Encounter: Payer: Self-pay | Admitting: Family Medicine

## 2021-07-03 ENCOUNTER — Ambulatory Visit (INDEPENDENT_AMBULATORY_CARE_PROVIDER_SITE_OTHER): Payer: BC Managed Care – PPO | Admitting: Family Medicine

## 2021-07-03 VITALS — BP 130/78 | HR 70 | Temp 97.8°F | Ht 69.0 in | Wt 256.2 lb

## 2021-07-03 DIAGNOSIS — M542 Cervicalgia: Secondary | ICD-10-CM | POA: Diagnosis not present

## 2021-07-03 DIAGNOSIS — L84 Corns and callosities: Secondary | ICD-10-CM

## 2021-07-03 DIAGNOSIS — L820 Inflamed seborrheic keratosis: Secondary | ICD-10-CM

## 2021-07-03 DIAGNOSIS — I10 Essential (primary) hypertension: Secondary | ICD-10-CM | POA: Diagnosis not present

## 2021-07-03 DIAGNOSIS — G8929 Other chronic pain: Secondary | ICD-10-CM

## 2021-07-03 DIAGNOSIS — L819 Disorder of pigmentation, unspecified: Secondary | ICD-10-CM

## 2021-07-03 MED ORDER — UREA 40 % EX CREA: TOPICAL_CREAM | CUTANEOUS | 5 refills | Status: AC

## 2021-07-03 NOTE — Progress Notes (Signed)
? ?  Donald Gilbert is a 66 y.o. male who presents today for an office visit. ? ?Assessment/Plan:  ?New/Acute Problems: ?Suspicious skin lesion ?Biopsy performed today.  See below procedure note.  Tolerated well. ? ?Keratotic Lesion ?Likely corn related to suture placement from a few years ago.  Doubt retained foreign body.  We will try topical urea to dissolve callus over the next several weeks.  He will come back for debridement if needed. ? ?Chronic Problems Addressed Today: ?Essential hypertension ?Initially elevated though at goal on recheck on losartan 100 mg daily. ? ?Chronic neck pain ?Overall stable.  We discussed referral to physical therapy or come back to sports medicine.  He deferred for now.  He will let me know if symptoms worsen. ? ?  ?Subjective:  ?HPI: ? ?Patient here with a few issues he would like to discuss today: ? ?He has a lesion on his forehead. Thinks it could be getting bigger over the last several months. Has been there at least a year.  No pain.  No itching. ? ?He also has noticed a lesion on the bottom his right great toe.  Has been there for several years.  He had a laceration about 3 years ago resulting in stitches.  He does not remember having stitches removed.  Since then he has 2 small areas of callus buildup.  Sometimes drains.  It is very painful to palpation. ? ?   ?  ?Objective:  ?Physical Exam: ?BP 130/78 Comment: right arm  Pulse 70   Temp 97.8 ?F (36.6 ?C) (Temporal)   Ht 5\' 9"  (1.753 m)   Wt 256 lb 3.2 oz (116.2 kg)   SpO2 98%   BMI 37.83 kg/m?   ?Gen: No acute distress, resting comfortably ?CV: Regular rate and rhythm with no murmurs appreciated ?Pulm: Normal work of breathing, clear to auscultation bilaterally with no crackles, wheezes, or rhonchi ?Skin: Pigmented lesion approximately 4 to 5 mm in diameter on central forehead. ?- Feet: 2 discrete areas of callus formation on plantar aspect of right great toe.  ?Neuro: Grossly normal, moves all extremities ?Psych:  Normal affect and thought content ? ?Punch Biopsy Procedure Note ? ?Pre-operative Diagnosis: Suspicious lesion ? ?Post-operative Diagnosis: same ? ?Locations:Central Forehead ? ?Indications: Diagnostic ? ?Anesthesia: Lidocaine 1% without epinephrine without added sodium bicarbonate ? ?Procedure Details  ?History of allergy to iodine: no ?Patient informed of the risks (including bleeding and infection) and benefits of the  ?procedure and Verbal informed consent obtained. ? ?The lesion and surrounding area was given a sterile prep using betadyne and draped in the usual sterile fashion. The skin was then stretched perpendicular to the skin tension lines and the lesion removed using the 60mm punch. The resulting ellipse was then closed. The wound was closed with ntibiotic ointment and a sterile dressing applied. The specimen was sent for pathologic examination. The patient tolerated the procedure well. ? ?EBL: 3 ml ? ?Findings: ?Suspicious lesion ? ?Condition: ?Stable ? ?Complications: ?none. ? ?Plan: ?1. Instructed to keep the wound dry and covered for 24-48h and clean thereafter. ?2. Warning signs of infection were reviewed.   ?3. Recommended that the patient use OTC analgesics as needed for pain.  ? ?   ? ?11m. [name], MD ?07/03/2021 9:06 AM  ?

## 2021-07-03 NOTE — Assessment & Plan Note (Signed)
Initially elevated though at goal on recheck on losartan 100 mg daily. ?

## 2021-07-03 NOTE — Assessment & Plan Note (Signed)
Overall stable.  We discussed referral to physical therapy or come back to sports medicine.  He deferred for now.  He will let me know if symptoms worsen. ?

## 2021-07-03 NOTE — Patient Instructions (Addendum)
It was very nice to see you today! ? ?We biopsied the spot on your forehead today. ? ?Please keep it covered for the next 24-48 hours. Let me know if you develop any signs of infection. ? ?Please let me know if you want to go back to see sports medicine or physical therapy for your neck. ? ?Please use the urea cream for the spot on your toe.  Let me know if not improving. ? ?Take care, ?Dr [name] ? ?PLEASE NOTE: ? ?If you had any lab tests please let us know if you have not heard back within a few days. You may see your results on mychart before we have a chance to review them but we will give you a call once they are reviewed by [place]. If we ordered any referrals today, please let us know if you have not heard from their office within the next week.  ? ?Please try these tips to maintain a healthy lifestyle: ? ?Eat at least 3 REAL meals and 1-2 snacks per day.  Aim for no more than 5 hours between eating.  If you eat breakfast, please do so within one hour of getting up.  ? ?Each meal should contain half fruits/vegetables, one quarter protein, and one quarter carbs (no bigger than a computer mouse) ? ?Cut down on sweet beverages. This includes juice, soda, and sweet tea.  ? ?Drink at least 1 glass of water with each meal and aim for at least 8 glasses per day ? ?Exercise at least 150 minutes every week.   ?

## 2021-07-10 NOTE — Progress Notes (Signed)
Please inform patient of the following: ? ?Great news! 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 This is common in folks as they get older. This should not cause any issues in the future. Do not need to do any further testing or biopsy at this time. ? ?[name]. [name], MD ?07/10/2021 8:03 AM  ?

## 2021-08-11 ENCOUNTER — Encounter: Payer: Self-pay | Admitting: Family Medicine

## 2021-08-12 NOTE — Telephone Encounter (Signed)
See note

## 2021-08-12 NOTE — Telephone Encounter (Signed)
Can we have him come in for office visit on Friday?  [name]. [name], MD 08/12/2021 11:27 AM

## 2021-08-14 ENCOUNTER — Encounter: Payer: Self-pay | Admitting: Family Medicine

## 2021-08-14 ENCOUNTER — Ambulatory Visit (INDEPENDENT_AMBULATORY_CARE_PROVIDER_SITE_OTHER): Payer: BC Managed Care – PPO | Admitting: Family Medicine

## 2021-08-14 DIAGNOSIS — I10 Essential (primary) hypertension: Secondary | ICD-10-CM

## 2021-08-14 DIAGNOSIS — E669 Obesity, unspecified: Secondary | ICD-10-CM | POA: Diagnosis not present

## 2021-08-14 MED ORDER — WEGOVY 0.25 MG/0.5ML ~~LOC~~ SOAJ: 0.2500 mg | SUBCUTANEOUS | 0 refills | Status: DC

## 2021-08-14 NOTE — Patient Instructions (Signed)
It was very nice to see you today!  We will start [facility].  Please take 0.25 mg weekly.  Send a message in a few weeks to let me know how you are doing and we can increase the dose.  Your blood pressure cuff is too small.  Please keep an eye on your blood pressure at home when she get a larger cuff and let me know if it is persistently elevated.  Take care, Dr [name]  PLEASE NOTE:  If you had any lab tests please let us know if you have not heard back within a few days. You may see your results on mychart before we have a chance to review them but we will give you a call once they are reviewed by [place]. If we ordered any referrals today, please let us know if you have not heard from their office within the next week.   Please try these tips to maintain a healthy lifestyle:  Eat at least 3 REAL meals and 1-2 snacks per day.  Aim for no more than 5 hours between eating.  If you eat breakfast, please do so within one hour of getting up.   Each meal should contain half fruits/vegetables, one quarter protein, and one quarter carbs (no bigger than a computer mouse)  Cut down on sweet beverages. This includes juice, soda, and sweet tea.   Drink at least 1 glass of water with each meal and aim for at least 8 glasses per day  Exercise at least 150 minutes every week.

## 2021-08-14 NOTE — Assessment & Plan Note (Signed)
BMI 38.45 with several comorbidities.  He has had a sleep restriction in the past but did not feel like it did much to help curb his appetite.  We discussed medication options.  He has done well with Ozempic in the past.  We will try Wegovy 0.25 mg weekly and increase the dose as tolerated after 4 weeks.  We discussed potential side effects.  He will follow-up in a few weeks via MyChart.

## 2021-08-14 NOTE — Progress Notes (Signed)
   Donald Gilbert is a 66 y.o. male who presents today for an office visit.  Assessment/Plan:  Chronic Problems Addressed Today: Essential hypertension Blood pressure at goal today on our cuff.  His home cuff is reading about 20-30 points higher than ours.  His home cuff is too small.  Discussed importance of proper sized cuff.  He will get a proper size cuff and continue to monitor at home and let us know if persistently elevated.  He is having a little bit of lower extremity edema that is likely due to venous insufficiency.  We will discuss weight loss as below which should help with his fluid retention.  Also discussed importance of frequent elevation and salt avoidance.  If this continues to be an issue would consider addition of diuretic such as HCTZ.  Obesity BMI 38.45 with several comorbidities.  He has had a sleep restriction in the past but did not feel like it did much to help curb his appetite.  We discussed medication options.  He has done well with Ozempic in the past.  We will try Wegovy 0.25 mg weekly and increase the dose as tolerated after 4 weeks.  We discussed potential side effects.  He will follow-up in a few weeks via MyChart.     Subjective:  HPI:  Patient here with concerns for elevated blood pressure readings.  Over the last several weeks he has been monitoring blood pressure at home and has been in the 140s to 160s over 80s to 90s.  He also has noticed more swelling in his legs.  He is also concerned about weight gain.  He did have a sleeve gastrectomy a couple of years ago however has not felt like this has done much to curb his appetite.       Objective:  Physical Exam: BP 132/88   Pulse (!) 53   Temp 98.1 F (36.7 C) (Oral)   Ht 5\' 9"  (1.753 m)   Wt 260 lb 6.4 oz (118.1 kg)   SpO2 99%   BMI 38.45 kg/m   Wt Readings from Last 3 Encounters:  08/14/21 260 lb 6.4 oz (118.1 kg)  07/03/21 256 lb 3.2 oz (116.2 kg)  03/11/21 255 lb (115.7 kg)    Gen: No  acute distress, resting comfortably CV: Regular rate and rhythm with no murmurs appreciated Pulm: Normal work of breathing, clear to auscultation bilaterally with no crackles, wheezes, or rhonchi Neuro: Grossly normal, moves all extremities Psych: Normal affect and thought content      [name] M. 03/13/21, MD 08/14/2021 9:43 AM

## 2021-08-14 NOTE — Assessment & Plan Note (Signed)
Blood pressure at goal today on our cuff.  His home cuff is reading about 20-30 points higher than ours.  His home cuff is too small.  Discussed importance of proper sized cuff.  He will get a proper size cuff and continue to monitor at home and let us know if persistently elevated.  He is having a little bit of lower extremity edema that is likely due to venous insufficiency.  We will discuss weight loss as below which should help with his fluid retention.  Also discussed importance of frequent elevation and salt avoidance.  If this continues to be an issue would consider addition of diuretic such as HCTZ.

## 2021-08-18 ENCOUNTER — Other Ambulatory Visit: Payer: BC Managed Care – PPO

## 2021-08-21 ENCOUNTER — Other Ambulatory Visit: Payer: BC Managed Care – PPO

## 2021-08-28 ENCOUNTER — Ambulatory Visit
Admission: RE | Admit: 2021-08-28 | Discharge: 2021-08-28 | Disposition: A | Discharge status: Home / Self Care | Payer: Self-pay | Source: Ambulatory Visit | Attending: Family Medicine | Admitting: Family Medicine

## 2021-08-28 ENCOUNTER — Encounter: Payer: Self-pay | Admitting: Family Medicine

## 2021-08-28 DIAGNOSIS — I10 Essential (primary) hypertension: Secondary | ICD-10-CM

## 2021-08-28 MED ORDER — WEGOVY 0.5 MG/0.5ML ~~LOC~~ SOAJ: 0.5000 mg | SUBCUTANEOUS | 0 refills | Status: DC

## 2021-08-29 NOTE — Progress Notes (Signed)
Please inform patient of the following:  HE has a little calcium build up in the arteries in his heart that is a little above average for someone his age and gender. We need to be more aggressive with his risk factor modification and ideally should try to get his LDL to 70 or lower.   Can we have him come in for a lipid panel soon? Please place future order.

## 2021-09-01 ENCOUNTER — Other Ambulatory Visit: Payer: Self-pay | Admitting: *Deleted

## 2021-09-01 DIAGNOSIS — E782 Mixed hyperlipidemia: Secondary | ICD-10-CM

## 2021-09-02 ENCOUNTER — Encounter: Payer: Self-pay | Admitting: Family Medicine

## 2021-09-02 DIAGNOSIS — I77819 Aortic ectasia, unspecified site: Secondary | ICD-10-CM | POA: Insufficient documentation

## 2021-09-02 NOTE — Progress Notes (Signed)
Please inform patient of the following:  He had a small area of enlargement in his aorta. This is not a cause for immediate concern but it is something that we should keep an eye on. We should repeat a CTA scan next year.

## 2021-09-11 ENCOUNTER — Other Ambulatory Visit (INDEPENDENT_AMBULATORY_CARE_PROVIDER_SITE_OTHER): Payer: BC Managed Care – PPO

## 2021-09-11 DIAGNOSIS — E782 Mixed hyperlipidemia: Secondary | ICD-10-CM | POA: Diagnosis not present

## 2021-09-11 LAB — LIPID PANEL
Cholesterol: 125 mg/dL (ref 0–200)
HDL: 37.4 mg/dL — ABNORMAL LOW (ref >39.00)
LDL Cholesterol: 73 mg/dL (ref 0–99)
NonHDL: 87.96
Total CHOL/HDL Ratio: 3
Triglycerides: 75 mg/dL (ref 0.0–149.0)
VLDL: 15 mg/dL (ref 0.0–40.0)

## 2021-09-14 NOTE — Progress Notes (Signed)
Please inform patient of the following:  His cholesterol levels are overall reassuring however it would be reasonable to try to get his LDL lower than 70. He is very close at this point with an LDL of 73. We can either increase his lipitor to 80mg  daily and we can recheck an LDL in 3-6 months OR we can leave the dose as it is currently and have him work on weight loss for the next few months and recheck at that time.

## 2021-09-17 ENCOUNTER — Other Ambulatory Visit: Payer: Self-pay | Admitting: *Deleted

## 2021-09-17 DIAGNOSIS — E782 Mixed hyperlipidemia: Secondary | ICD-10-CM

## 2021-09-17 MED ORDER — ATORVASTATIN CALCIUM 80 MG PO TABS: 80.0000 mg | ORAL_TABLET | Freq: Every day | ORAL | 0 refills | Status: DC

## 2021-09-23 ENCOUNTER — Other Ambulatory Visit: Payer: Self-pay | Admitting: *Deleted

## 2021-09-23 MED ORDER — LOSARTAN POTASSIUM 100 MG PO TABS: 100.0000 mg | ORAL_TABLET | Freq: Every day | ORAL | 3 refills | Status: DC

## 2021-09-23 MED ORDER — ATORVASTATIN CALCIUM 80 MG PO TABS: 80.0000 mg | ORAL_TABLET | Freq: Every day | ORAL | 0 refills | Status: DC

## 2021-10-16 ENCOUNTER — Encounter: Payer: BC Managed Care – PPO | Admitting: Family Medicine

## 2021-11-06 ENCOUNTER — Encounter: Payer: Self-pay | Admitting: Family Medicine

## 2021-11-06 ENCOUNTER — Ambulatory Visit (INDEPENDENT_AMBULATORY_CARE_PROVIDER_SITE_OTHER): Payer: BC Managed Care – PPO | Admitting: Family Medicine

## 2021-11-06 VITALS — BP 157/90 | HR 55 | Temp 98.5°F | Ht 69.0 in | Wt 253.8 lb

## 2021-11-06 DIAGNOSIS — I10 Essential (primary) hypertension: Secondary | ICD-10-CM

## 2021-11-06 DIAGNOSIS — Z0001 Encounter for general adult medical examination with abnormal findings: Secondary | ICD-10-CM | POA: Diagnosis not present

## 2021-11-06 DIAGNOSIS — E669 Obesity, unspecified: Secondary | ICD-10-CM | POA: Diagnosis not present

## 2021-11-06 DIAGNOSIS — I77819 Aortic ectasia, unspecified site: Secondary | ICD-10-CM

## 2021-11-06 DIAGNOSIS — E559 Vitamin D deficiency, unspecified: Secondary | ICD-10-CM | POA: Diagnosis not present

## 2021-11-06 DIAGNOSIS — Z125 Encounter for screening for malignant neoplasm of prostate: Secondary | ICD-10-CM

## 2021-11-06 DIAGNOSIS — E782 Mixed hyperlipidemia: Secondary | ICD-10-CM | POA: Diagnosis not present

## 2021-11-06 DIAGNOSIS — R7303 Prediabetes: Secondary | ICD-10-CM | POA: Diagnosis not present

## 2021-11-06 LAB — COMPREHENSIVE METABOLIC PANEL
ALT: 24 U/L (ref 0–53)
AST: 19 U/L (ref 0–37)
Albumin: 4.4 g/dL (ref 3.5–5.2)
Alkaline Phosphatase: 71 U/L (ref 39–117)
BUN: 32 mg/dL — ABNORMAL HIGH (ref 6–23)
CO2: 29 mEq/L (ref 19–32)
Calcium: 9.1 mg/dL (ref 8.4–10.5)
Chloride: 102 mEq/L (ref 96–112)
Creatinine, Ser: 1.01 mg/dL (ref 0.40–1.50)
GFR: 79.05 mL/min (ref >60.00)
Glucose, Bld: 105 mg/dL — ABNORMAL HIGH (ref 70–99)
Potassium: 4.1 mEq/L (ref 3.5–5.1)
Sodium: 137 mEq/L (ref 135–145)
Total Bilirubin: 1.1 mg/dL (ref 0.2–1.2)
Total Protein: 6.7 g/dL (ref 6.0–8.3)

## 2021-11-06 LAB — LIPID PANEL
Cholesterol: 134 mg/dL (ref 0–200)
HDL: 41.5 mg/dL (ref >39.00)
LDL Cholesterol: 80 mg/dL (ref 0–99)
NonHDL: 92.46
Total CHOL/HDL Ratio: 3
Triglycerides: 63 mg/dL (ref 0.0–149.0)
VLDL: 12.6 mg/dL (ref 0.0–40.0)

## 2021-11-06 LAB — HEMOGLOBIN A1C: Hgb A1c MFr Bld: 6.1 % (ref 4.6–6.5)

## 2021-11-06 LAB — CBC
HCT: 46.8 % (ref 39.0–52.0)
Hemoglobin: 15.9 g/dL (ref 13.0–17.0)
MCHC: 34 g/dL (ref 30.0–36.0)
MCV: 88.8 fl (ref 78.0–100.0)
Platelets: 162 10*3/uL (ref 150.0–400.0)
RBC: 5.28 Mil/uL (ref 4.22–5.81)
RDW: 14 % (ref 11.5–15.5)
WBC: 7.2 10*3/uL (ref 4.0–10.5)

## 2021-11-06 LAB — VITAMIN D 25 HYDROXY (VIT D DEFICIENCY, FRACTURES): VITD: 64.37 ng/mL (ref 30.00–100.00)

## 2021-11-06 LAB — TSH: TSH: 1.57 u[IU]/mL (ref 0.35–5.50)

## 2021-11-06 LAB — PSA: PSA: 2.1 ng/mL (ref 0.10–4.00)

## 2021-11-06 NOTE — Progress Notes (Signed)
Chief Complaint:  Donald Gilbert is a 66 y.o. male who presents today for his annual comprehensive physical exam.    Assessment/Plan:  New/Acute Problems: Corn / callus Likely secondary to poor fitting composite toe  shoes.  Recommend placing cushioning to the area.  Can also use urea cream to help remove the callus.  He will try wearing that shoe and waiting for them to break-in.  We discussed reasons to return to care.  Chronic Problems Addressed Today: Prediabetes Check A1c.  He has not been able to get Wegovy due to his lack insurance.  Mixed hyperlipidemia We will check lipids.  He was noted to have some calcification on his recent coronary artery calcium scan.  Increase his Lipitor to 80 mg daily.  He started well.  We will ideally get his less than 70.  Essential hypertension Elevated today.  He is previously been well controlled.  He will continue to monitor at home and let us know if persistently 140/90 or higher.  We will continue losartan 100 mg daily.  If blood pressures persistently elevated at home we will likely add on HCTZ.  Obesity BMI is 37.  We are working on Radiographer, therapeutic.  His prescription is at pharmacy but they are waiting for her to come back in stock.  Vitamin D deficiency Check vitamin D.  Aortic dilatation (HCC) He will be 4.2 cm on recent CT calcium score versus 4.1 cm 4 years ago.  Radiology recommended repeat CTA next year. Will obtain this per radiologist recommendations.   Preventative Healthcare: Check labs.  Flu vaccine deferred until October.  Up-to-date on colon cancer screening.  Patient Counseling(The following topics were reviewed and/or handout was given):  -Nutrition: Stressed importance of moderation in sodium/caffeine intake, saturated fat and cholesterol, caloric balance, sufficient intake of fresh fruits, vegetables, and fiber.  -Stressed the importance of regular exercise.   -Substance Abuse: Discussed cessation/primary prevention  of tobacco, alcohol, or other drug use; driving or other dangerous activities under the influence; availability of treatment for abuse.   -Injury prevention: Discussed safety belts, safety helmets, smoke detector, smoking near bedding or upholstery.   -Sexuality: Discussed sexually transmitted diseases, partner selection, use of condoms, avoidance of unintended pregnancy and contraceptive alternatives.   -Dental health: Discussed importance of regular tooth brushing, flossing, and dental visits.  -Health maintenance and immunizations reviewed. Please refer to Health maintenance section.  Return to care in 1 year for next preventative visit.     Subjective:  HPI:  He has no acute complaints today.   Lifestyle Diet: Balanced. Plenty of fruits and vegetables.  Exercise: Very active with work.      11/06/2021    8:21 AM  Depression screen PHQ 2/9  Decreased Interest 0  Down, Depressed, Hopeless 0  PHQ - 2 Score 0    Health Maintenance Due  Topic Date Due   COVID-19 Vaccine (5 - Pfizer risk series) 03/06/2021     ROS: Per HPI, otherwise a complete review of systems was negative.   PMH:  The following were reviewed and entered/updated in epic: Past Medical History:  Diagnosis Date   Allergy    mild    Arthralgia of multiple joints 04/03/2017   Arthritis    Atopic dermatitis 04/03/2017   BPPV (benign paroxysmal positional vertigo)    Chickenpox    Constipation    maybe twice a week due to job    Diabetes mellitus without complication (HCC)    type 2  Difficult intubation    Dysthymia 04/03/2017   History of kidney stones    recurrent   Hyperlipidemia    Hypertension    Insulin resistance 04/03/2017   Nasal polyp    OSA on CPAP    Sleep apnea    wears cpap   11cm H20   Urinary hesitancy 04/03/2017   Vitamin D deficiency 04/03/2017   Patient Active Problem List   Diagnosis Date Noted   Aortic dilatation (HCC) 09/02/2021   Partial tear of right rotator cuff  03/11/2021   Erectile dysfunction 10/10/2020   Chronic neck pain 10/05/2019   Prediabetes 01/22/2019   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   Former smoker 01/19/2019   Difficult intubation    OSA on CPAP 05/30/2018   Vitamin D deficiency 04/03/2017   Obesity 04/03/2017   Essential hypertension 04/03/2017   Mixed hyperlipidemia 04/03/2017   Recurrent kidney stones 04/03/2017   Arthralgia of multiple joints 04/03/2017   Atopic dermatitis 04/03/2017   Dysthymia 04/03/2017   Past Surgical History:  Procedure Laterality Date   COLONOSCOPY  03/2008   no precancerous polyps   COLONOSCOPY WITH PROPOFOL N/A 10/02/2018   Procedure: COLONOSCOPY WITH PROPOFOL;  Surgeon: Sherrilyn Rist, MD;  Location: [facility];  Service: Gastroenterology;  Laterality: N/A;   LAPAROSCOPIC GASTRIC SLEEVE RESECTION N/A 01/22/2019   Procedure: LAPAROSCOPIC GASTRIC SLEEVE RESECTION AND HIATAL HERNIA REPAIR, Upper Endo, Eras Pathway;  Surgeon: Gaynelle Adu, MD;  Location: [facility];  Service: General;  Laterality: N/A;   LASIK     TONSILLECTOMY     UMBILICAL HERNIA REPAIR     2016   uvula removed       Family History  Problem Relation Age of Onset   Hypertension Mother    Colon cancer Neg Hx    Colon polyps Neg Hx    Esophageal cancer Neg Hx    Rectal cancer Neg Hx    Stomach cancer Neg Hx     Medications- reviewed and updated Current Outpatient Medications  Medication Sig Dispense Refill   allopurinol (ZYLOPRIM) 300 MG tablet TAKE 1 TABLET(300 MG) BY MOUTH DAILY 90 tablet 3   atorvastatin (LIPITOR) 80 MG tablet Take 1 tablet (80 mg total) by mouth daily. 90 tablet 0   calcium carbonate (TUMS - DOSED IN MG ELEMENTAL CALCIUM) 500 MG chewable tablet Chew 1 tablet by mouth 3 (three) times daily with meals.     losartan (COZAAR) 100 MG tablet Take 1 tablet (100 mg total) by mouth daily. 90 tablet 3   meloxicam (MOBIC) 7.5 MG tablet Take 1 tablet (7.5 mg total) by mouth daily. 20 tablet 0    Multiple Vitamin (MULTIVITAMIN WITH MINERALS) TABS tablet Take 1 tablet by mouth daily.     Semaglutide-Weight Management (WEGOVY) 0.25 MG/0.5ML SOAJ Inject 0.25 mg into the skin once a week. 2 mL 0   Semaglutide-Weight Management (WEGOVY) 0.5 MG/0.5ML SOAJ Inject 0.5 mg into the skin once a week. 2 mL 0   tadalafil (CIALIS) 5 MG tablet Take 1 tablet (5 mg total) by mouth daily as needed for erectile dysfunction. 10 tablet 0   triamcinolone (NASACORT) 55 MCG/ACT AERO nasal inhaler Place 2 sprays into the nose daily. 2 sprays each nostril at night 1 each 12   urea (CARMOL) 40 % CREA Apply daily 227 g 5   No current facility-administered medications for this visit.    Allergies-reviewed and updated No Known Allergies  Social History   Socioeconomic History  Marital status: Married    Spouse name: Not on file   Number of children: Not on file   Years of education: Not on file   Highest education level: Not on file  Occupational History    Employer: Educational psychologist  Tobacco Use   Smoking status: Former   Smokeless tobacco: Former    Types: Chew    Quit date: 2001  Vaping Use   Vaping Use: Never used  Substance and Sexual Activity   Alcohol use: Not Currently    Comment: OCC    Drug use: No   Sexual activity: Yes  Other Topics Concern   Not on file  Social History Narrative   Not on file   Social Determinants of Health   Financial Resource Strain: Not on file  Food Insecurity: Not on file  Transportation Needs: Not on file  Physical Activity: Not on file  Stress: Not on file  Social Connections: Not on file        Objective:  Physical Exam: BP (!) 157/90   Pulse (!) 55   Temp 98.5 F (36.9 C) (Temporal)   Ht 5\' 9"  (1.753 m)   Wt 253 lb 12.8 oz (115.1 kg)   SpO2 96%   BMI 37.48 kg/m   Body mass index is 37.48 kg/m. Wt Readings from Last 3 Encounters:  11/06/21 253 lb 12.8 oz (115.1 kg)  08/14/21 260 lb 6.4 oz (118.1 kg)  07/03/21 256 lb 3.2 oz  (116.2 kg)   Gen: NAD, resting comfortably HEENT: TMs normal bilaterally. OP clear. No thyromegaly noted.  CV: RRR with no murmurs appreciated Pulm: NWOB, CTAB with no crackles, wheezes, or rhonchi GI: Normal bowel sounds present. Soft, Nontender, Nondistended. MSK: no edema, cyanosis, or clubbing noted.  Callus noted on medial aspect of great toe bilaterally Skin: warm, dry Neuro: CN2-12 grossly intact. Strength 5/5 in upper and lower extremities. Reflexes symmetric and intact bilaterally.  Psych: Normal affect and thought content     Kervens Roper M. 07/05/21, MD 11/06/2021 9:04 AM

## 2021-11-06 NOTE — Assessment & Plan Note (Signed)
Check A1c.  He has not been able to get Wegovy due to his lack insurance.

## 2021-11-06 NOTE — Assessment & Plan Note (Signed)
Check vitamin D. 

## 2021-11-06 NOTE — Patient Instructions (Signed)
It was very nice to see you today!  Please try placing cushioning on the side of your big toe.  You can also use the urea cream to remove the callus.  We will check blood work today.  Please keep up the good work on your diet and exercise.  I will see back in year for your next physical.  Please come back to see me sooner if needed.  Take care, Dr [name]  PLEASE NOTE:  If you had any lab tests please let us know if you have not heard back within a few days. You may see your results on mychart before we have a chance to review them but we will give you a call once they are reviewed by [place]. If we ordered any referrals today, please let us know if you have not heard from their office within the next week.   Please try these tips to maintain a healthy lifestyle:  Eat at least 3 REAL meals and 1-2 snacks per day.  Aim for no more than 5 hours between eating.  If you eat breakfast, please do so within one hour of getting up.   Each meal should contain half fruits/vegetables, one quarter protein, and one quarter carbs (no bigger than a computer mouse)  Cut down on sweet beverages. This includes juice, soda, and sweet tea.   Drink at least 1 glass of water with each meal and aim for at least 8 glasses per day  Exercise at least 150 minutes every week.    Preventive Care 27-74 Years Old, Male Preventive care refers to lifestyle choices and visits with your health care provider that can promote health and wellness. Preventive care visits are also called wellness exams. What can I expect for my preventive care visit? Counseling During your preventive care visit, your health care provider may ask about your: Medical history, including: Past medical problems. Family medical history. Current health, including: Emotional well-being. Home life and relationship well-being. Sexual activity. Lifestyle, including: Alcohol, nicotine or tobacco, and drug use. Access to firearms. Diet, exercise,  and sleep habits. Safety issues such as seatbelt and bike helmet use. Sunscreen use. Work and work Astronomer. Physical exam Your health care provider will check your: Height and weight. These may be used to calculate your BMI (body mass index). BMI is a measurement that tells if you are at a healthy weight. Waist circumference. This measures the distance around your waistline. This measurement also tells if you are at a healthy weight and may help predict your risk of certain diseases, such as type 2 diabetes and high blood pressure. Heart rate and blood pressure. Body temperature. Skin for abnormal spots. What immunizations do I need?  Vaccines are usually given at various ages, according to a schedule. Your health care provider will recommend vaccines for you based on your age, medical history, and lifestyle or other factors, such as travel or where you work. What tests do I need? Screening Your health care provider may recommend screening tests for certain conditions. This may include: Lipid and cholesterol levels. Diabetes screening. This is done by checking your blood sugar (glucose) after you have not eaten for a while (fasting). Hepatitis B test. Hepatitis C test. HIV (human immunodeficiency virus) test. STI (sexually transmitted infection) testing, if you are at risk. Lung cancer screening. Prostate cancer screening. Colorectal cancer screening. Talk with your health care provider about your test results, treatment options, and if necessary, the need for more tests. Follow these instructions  at home: Eating and drinking  Eat a diet that includes fresh fruits and vegetables, whole grains, lean protein, and low-fat dairy products. Take vitamin and mineral supplements as recommended by your health care provider. Do not drink alcohol if your health care provider tells you not to drink. If you drink alcohol: Limit how much you have to 0-2 drinks a day. Know how much alcohol is  in your drink. In the [place] one drink equals one 12 oz bottle of beer (355 mL), one 5 oz glass of wine (148 mL), or one 1 oz glass of hard liquor (44 mL). Lifestyle Brush your teeth every morning and night with fluoride toothpaste. Floss one time each day. Exercise for at least 30 minutes 5 or more days each week. Do not use any products that contain nicotine or tobacco. These products include cigarettes, chewing tobacco, and vaping devices, such as e-cigarettes. If you need help quitting, ask your health care provider. Do not use drugs. If you are sexually active, practice safe sex. Use a condom or other form of protection to prevent STIs. Take aspirin only as told by your health care provider. Make sure that you understand how much to take and what form to take. Work with your health care provider to find out whether it is safe and beneficial for you to take aspirin daily. Find healthy ways to manage stress, such as: Meditation, yoga, or listening to music. Journaling. Talking to a trusted person. Spending time with friends and family. Minimize exposure to UV radiation to reduce your risk of skin cancer. Safety Always wear your seat belt while driving or riding in a vehicle. Do not drive: If you have been drinking alcohol. Do not ride with someone who has been drinking. When you are tired or distracted. While texting. If you have been using any mind-altering substances or drugs. Wear a helmet and other protective equipment during sports activities. If you have firearms in your house, make sure you follow all gun safety procedures. What's next? Go to your health care provider once a year for an annual wellness visit. Ask your health care provider how often you should have your eyes and teeth checked. Stay up to date on all vaccines. This information is not intended to replace advice given to you by your health care provider. Make sure you discuss any questions you have with your health  care provider. Document Revised: 08/27/2020 Document Reviewed: 08/27/2020 Elsevier Patient Education  2023 [employer].

## 2021-11-06 NOTE — Assessment & Plan Note (Signed)
We will check lipids.  He was noted to have some calcification on his recent coronary artery calcium scan.  Increase his Lipitor to 80 mg daily.  He started well.  We will ideally get his less than 70.

## 2021-11-06 NOTE — Assessment & Plan Note (Signed)
BMI is 37.  We are working on Radiographer, therapeutic.  His prescription is at pharmacy but they are waiting for her to come back in stock.

## 2021-11-06 NOTE — Assessment & Plan Note (Signed)
Elevated today.  He is previously been well controlled.  He will continue to monitor at home and let us know if persistently 140/90 or higher.  We will continue losartan 100 mg daily.  If blood pressures persistently elevated at home we will likely add on HCTZ.

## 2021-11-06 NOTE — Assessment & Plan Note (Signed)
He will be 4.2 cm on recent CT calcium score versus 4.1 cm 4 years ago.  Radiology recommended repeat CTA next year. Will obtain this per radiologist recommendations.

## 2021-11-11 NOTE — Progress Notes (Signed)
Please inform patient of the following:  His A1c is up a bit since last time we checked.  This should come down once we get him started on Wegovy.  He should continue to work on diet and exercise and limit intake of sweets and carbs.  We should recheck in about 6 months.  Everything else looks good and we can recheck in a year.

## 2021-11-12 NOTE — Telephone Encounter (Signed)
Spouse states she is calling Francena Hanly back in regard.    States 0.5 dose of Wegovy is not in stock.   States pharmacy has advised to call back in two weeks.

## 2021-11-19 ENCOUNTER — Other Ambulatory Visit: Payer: Self-pay | Admitting: *Deleted

## 2021-11-19 MED ORDER — WEGOVY 0.5 MG/0.5ML ~~LOC~~ SOAJ
0.5000 mg | SUBCUTANEOUS | 0 refills | Status: DC
  Filled 2021-11-19 – 2022-03-31 (×3): qty 2, 28d supply, fill #0

## 2021-11-19 NOTE — Telephone Encounter (Signed)
Send prescription to [facility] pharmacy Patient aware

## 2021-11-20 ENCOUNTER — Other Ambulatory Visit (HOSPITAL_BASED_OUTPATIENT_CLINIC_OR_DEPARTMENT_OTHER): Payer: Self-pay

## 2021-11-23 ENCOUNTER — Other Ambulatory Visit (HOSPITAL_BASED_OUTPATIENT_CLINIC_OR_DEPARTMENT_OTHER): Payer: Self-pay

## 2021-11-27 ENCOUNTER — Other Ambulatory Visit: Payer: BC Managed Care – PPO

## 2021-12-02 ENCOUNTER — Other Ambulatory Visit (HOSPITAL_BASED_OUTPATIENT_CLINIC_OR_DEPARTMENT_OTHER): Payer: Self-pay

## 2021-12-08 ENCOUNTER — Other Ambulatory Visit (HOSPITAL_BASED_OUTPATIENT_CLINIC_OR_DEPARTMENT_OTHER): Payer: Self-pay

## 2021-12-21 ENCOUNTER — Other Ambulatory Visit: Payer: Self-pay | Admitting: Family Medicine

## 2021-12-21 DIAGNOSIS — N200 Calculus of kidney: Secondary | ICD-10-CM

## 2021-12-24 ENCOUNTER — Other Ambulatory Visit (HOSPITAL_BASED_OUTPATIENT_CLINIC_OR_DEPARTMENT_OTHER): Payer: Self-pay

## 2021-12-30 ENCOUNTER — Ambulatory Visit (INDEPENDENT_AMBULATORY_CARE_PROVIDER_SITE_OTHER): Payer: BC Managed Care – PPO | Admitting: Family Medicine

## 2021-12-30 ENCOUNTER — Ambulatory Visit (INDEPENDENT_AMBULATORY_CARE_PROVIDER_SITE_OTHER): Payer: BC Managed Care – PPO

## 2021-12-30 ENCOUNTER — Ambulatory Visit: Payer: Self-pay

## 2021-12-30 VITALS — BP 162/98 | HR 65 | Ht 69.0 in | Wt 251.0 lb

## 2021-12-30 DIAGNOSIS — M25561 Pain in right knee: Secondary | ICD-10-CM

## 2021-12-30 DIAGNOSIS — G8929 Other chronic pain: Secondary | ICD-10-CM

## 2021-12-30 NOTE — Progress Notes (Unsigned)
I, Donald Gilbert, LAT, ATC acting as a scribe for Donald Graham, MD.  Donald Gilbert is a 66 y.o. male who presents to [facility] today for right knee pain.  Patient was previously seen by Dr. [name] on 03/11/2021 for right shoulder pain.  Today, patient complains of right knee pain worsening over the last 2 weeks. Pt works in [employer].  Patient locates pain to the medial aspect of the R knee. Pt c/o pain waking him up at night.   R Knee swelling: no Mechanical symptoms: no Radiates: yes-slightly proximally Aggravates: walking, at rest, driving, laying in bed Treatments tried: Tylenol, salonpas, ace wrap, brace   Pertinent review of systems: No fevers or chills  Relevant historical information: Hypertension.  Sleep apnea.  History of gastric sleeve.   Exam:  BP (!) 162/98   Pulse 65   Ht 5\' 9"  (1.753 m)   Wt 251 lb (113.9 kg)   SpO2 97%   BMI 37.07 kg/m  General: Well Developed, well nourished, and in no acute distress.   MSK: Right knee mild effusion normal motion with crepitation.  Tender palpation medial joint line. Stable ligamentous exam. Intact strength. Positive McMurray's test.    Lab and Radiology Results  Procedure: Real-time Ultrasound Guided Injection of right knee superior lateral patellar space Device: Philips Affiniti 50G Images permanently stored and available for review in PACS Ultrasound evaluation prior to injection reveals mild effusion.  Mild degeneration medial joint line. Verbal informed consent obtained.  Discussed risks and benefits of procedure. Warned about infection, bleeding, hyperglycemia damage to structures among others. Patient expresses understanding and agreement Time-out conducted.   Noted no overlying erythema, induration, or other signs of local infection.   Skin prepped in a sterile fashion.   Local anesthesia: Topical Ethyl chloride.   With sterile technique and under real time ultrasound  guidance: 40 mg of Kenalog and 2 mL Marcaine injected into knee joint. Fluid seen entering the joint capsule.   Completed without difficulty   Pain immediately resolved suggesting accurate placement of the medication.   Advised to call if fevers/chills, erythema, induration, drainage, or persistent bleeding.   Images permanently stored and available for review in the ultrasound unit.  Impression: Technically successful ultrasound guided injection.    X-ray images right knee obtained today personally and independently interpreted Mild medial compartment DJD.  No acute fractures. Await formal radiology review     Assessment and Plan: 66 y.o. male with right knee pain due to exacerbation of DJD versus degenerative medial meniscus tear.  Plan for steroid injection Voltaren gel and compression sleeve.  Check back as needed.  If not improving consider MRI to further evaluate potential for meniscus tear.   PDMP not reviewed this encounter. Orders Placed This Encounter  Procedures   66 LIMITED JOINT SPACE STRUCTURES LOW RIGHT(NO LINKED CHARGES)    Order Specific Question:   Reason for Exam (SYMPTOM  OR DIAGNOSIS REQUIRED)    Answer:   Right knee pain    Order Specific Question:   Preferred imaging location?    Answer:   [facility] Knee AP/LAT W/Sunrise Right    Standing Status:   Future    Number of Occurrences:   1    Standing Expiration Date:   01/30/2022    Order Specific Question:   Reason for Exam (SYMPTOM  OR DIAGNOSIS REQUIRED)    Answer:   Right knee pain    Order  Specific Question:   Preferred imaging location?    Answer:   [name]   No orders of the defined types were placed in this encounter.    Discussed warning signs or symptoms. Please see discharge instructions. Patient expresses understanding.   The above documentation has been reviewed and is accurate and complete [name], M.D.

## 2021-12-30 NOTE — Patient Instructions (Addendum)
Thank you for coming in today.   You received an injection today. Seek immediate medical attention if the joint becomes red, extremely painful, or is oozing fluid.   Have fun with the grandbabies this weekend!   Please use Voltaren gel (Generic Diclofenac Gel) up to 4x daily for pain as needed.  This is available over-the-counter as both the name brand Voltaren gel and the generic diclofenac gel.   Check back in 6 weeks

## 2021-12-31 ENCOUNTER — Ambulatory Visit (INDEPENDENT_AMBULATORY_CARE_PROVIDER_SITE_OTHER): Payer: BC Managed Care – PPO

## 2021-12-31 DIAGNOSIS — Z23 Encounter for immunization: Secondary | ICD-10-CM | POA: Diagnosis not present

## 2022-01-01 ENCOUNTER — Ambulatory Visit: Payer: BC Managed Care – PPO | Admitting: Family Medicine

## 2022-01-04 NOTE — Progress Notes (Signed)
Right knee x-ray shows some small arthritis changes.

## 2022-01-07 ENCOUNTER — Telehealth: Payer: Self-pay | Admitting: Family Medicine

## 2022-01-07 DIAGNOSIS — G8929 Other chronic pain: Secondary | ICD-10-CM

## 2022-01-07 NOTE — Telephone Encounter (Signed)
Called patient and asked him some more questions, the pain is keeping up at night, and sometimes the pain will get so bad his stomach will hurt (feels like a bad tooth ache). He is not a fan of sleeping pills and its not as bad as when he had kidney stones.   Patient is wanting to continue to the next steps which is the MRI but he wants to know is there anything else he can do, take, have prescribed to get him through till the MRI results come in?  Patient tried wrapping his knee, bought a copper wrap, Voltaren gel, Advil will get some relief but the pain comes back.

## 2022-01-07 NOTE — Telephone Encounter (Signed)
Pt seen last week, R knee is worse. Had injection at last visit with no improvement.  I was not sure if I should schedule pt, or what next plan was for him. He said he is using voltaren and doing all the things we advised.

## 2022-01-08 MED ORDER — HYDROCODONE-ACETAMINOPHEN 5-325 MG PO TABS: 1.0000 | ORAL_TABLET | Freq: Three times a day (TID) | ORAL | 0 refills | Status: DC | PRN

## 2022-01-08 NOTE — Telephone Encounter (Signed)
Patient is still in [place] can the medication be sent there [facility]  [address] [place], [place] [address]

## 2022-01-08 NOTE — Addendum Note (Signed)
Addended by: Gregor Hams on: 01/08/2022 12:05 PM   Modules accepted: Orders

## 2022-01-08 NOTE — Telephone Encounter (Signed)
Rx sent to the pharmacy in [place]

## 2022-01-08 NOTE — Telephone Encounter (Signed)
I have ordered an MRI of the knee.  Additionally prescribed hydrocodone for pain control. Hydrocodone for sent to the [facility] in [place].

## 2022-01-12 ENCOUNTER — Telehealth: Payer: Self-pay | Admitting: Family Medicine

## 2022-01-12 NOTE — Telephone Encounter (Signed)
Pt has MRI scheduled for 11/4 ( follow up with us11/7 )  Pt will run out of pain meds before this appt. Please refill to [facility] ( Yes, [facility] in [place], I confirmed pt will be returning to town tomorrow evening )

## 2022-01-13 MED ORDER — HYDROCODONE-ACETAMINOPHEN 5-325 MG PO TABS: 1.0000 | ORAL_TABLET | Freq: Three times a day (TID) | ORAL | 0 refills | Status: DC | PRN

## 2022-01-13 NOTE — Telephone Encounter (Signed)
Hydrocodone refilled.  

## 2022-01-16 ENCOUNTER — Ambulatory Visit (INDEPENDENT_AMBULATORY_CARE_PROVIDER_SITE_OTHER): Payer: BC Managed Care – PPO

## 2022-01-16 DIAGNOSIS — M25561 Pain in right knee: Secondary | ICD-10-CM | POA: Diagnosis not present

## 2022-01-16 DIAGNOSIS — G8929 Other chronic pain: Secondary | ICD-10-CM | POA: Diagnosis not present

## 2022-01-18 NOTE — Progress Notes (Addendum)
I, Donald Gilbert, LAT, ATC acting as a scribe for Donald Leader, MD.  Donald Gilbert is a 66 y.o. male who presents to [place] at [facility] today for f/u chronic R knee pain and MRI review. Pt was last seen by Dr. Georgina Gilbert on 12/30/21 and was given a R knee steroid injection and was advised to use a compression sleeve and Voltaren gel.  Patient called the office on 1026 reporting severe right knee pain and he was prescribed hydrocodone and an MRI was ordered.  Today, patient reports R knee is feeling terrible. Pt is taking 2 hydrocodone at night to try to get some sleep. 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   Dx imaging: 01/16/22 R knee MRI  12/30/21 R knee XR  Pertinent review of systems: No fevers or chills  Relevant historical information: Hypertension.  Gastric sleeve.   Exam:  BP (!) 162/92   Pulse 63   Ht 5\' 9"  (1.753 m)   Wt 254 lb (115.2 kg)   SpO2 98%   BMI 37.51 kg/m  General: Well Developed, well nourished, and in no acute distress.   MSK: Right knee normal-appearing Tender palpation medial joint line. Laxity and pain with MCL stress test.  And pain with positive medial McMurray's test.    Lab and Radiology Results  Procedure: Real-time Ultrasound Guided Injection of hypoechoic fluid tracking along medial joint line and MCL. Device: Philips Affiniti 50G Images permanently stored and available for review in PACS Verbal informed consent obtained.  Discussed risks and benefits of procedure. Warned about infection, bleeding, hyperglycemia damage to structures among others. Patient expresses understanding and agreement Time-out conducted.   Noted no overlying erythema, induration, or other signs of local infection.   Skin prepped in a sterile fashion.   Local anesthesia: Topical Ethyl chloride.   With sterile technique and under real time ultrasound guidance: 40 mg of Kenalog and 2 mL of Marcaine injected into medial  joint line hypoechoic fluid area. Fluid seen entering the bursa along MCL.   Completed without difficulty   Pain moderately resolved suggesting accurate placement of the medication.   Advised to call if fevers/chills, erythema, induration, drainage, or persistent bleeding.   Images permanently stored and available for review in the ultrasound unit.  Impression: Technically successful ultrasound guided injection.     No results found for this or any previous visit (from the past 72 hour(s)). MR KNEE RIGHT WO CONTRAST  Result Date: 01/18/2022 CLINICAL DATA:  Medial right knee pain for 5 weeks EXAM: MRI OF THE RIGHT KNEE WITHOUT CONTRAST TECHNIQUE: Multiplanar, multisequence MR imaging of the knee was performed. No intravenous contrast was administered. COMPARISON:  The grafts 12/30/2021 FINDINGS: MENISCI Medial meniscus: Degenerative tear of the posterior horn posteromedially as on image 14 series 6, extending anteriorly as oblique inferior surface involvement on images 23 through 25 of series 7 Lateral meniscus:  Unremarkable LIGAMENTS Cruciates:  Unremarkable Collaterals: Mild edema tracks adjacent to the MCL. This can be incidental but in the appropriate clinical circumstance could represent grade 1 sprain. Trace MCL bursitis. CARTILAGE Patellofemoral: Moderate chondral thinning along the medial patellar facet. Mild chondral thinning centrally in the femoral trochlear groove. Medial: Moderate degenerative chondral thinning. Adjacent to the medial meniscal tear there is endosteal edema mild marrow edema in the medial tibial plateau as on image 15 series 5. Lateral:  Unremarkable Joint:  Small knee effusion. Popliteal Fossa: Mild semimembranosus-tibial collateral ligament bursitis. Mild infiltrative  edema in the popliteal space. Extensor Mechanism:  Unremarkable Bones: No significant extra-articular osseous abnormalities identified. Other: Subcutaneous edema is noted along the medial and anteromedial  knee. IMPRESSION: 1. Degenerative tear of the posterior horn medial meniscus posteromedially, extending anteriorly as an oblique inferior surface tear. 2. Mild edema tracks adjacent to the MCL. This can be incidental but in the appropriate clinical circumstance could represent grade 1 sprain. Trace MCL bursitis. 3. Moderate degenerative chondral thinning in the medial compartment and along the medial patellar facet. Mild chondral thinning centrally in the femoral trochlear groove. 4. Small knee effusion with mild semimembranosus-tibial collateral ligament bursitis. 5. Mild infiltrative edema in the popliteal space. 6. Subcutaneous edema along the medial and anteromedial knee. Electronically Signed   By: Gaylyn Rong M.D.   On: 01/18/2022 12:13    I, [name], personally (independently) visualized and performed the interpretation of the images attached in this note.    Assessment and Plan: 66 y.o. male with multifactorial medial knee pain.  Patient has multiple sources of knee pain including medial DJD, tendinitis/bursitis this area, MCL irritation, and posterior medial degenerative meniscus tear. Conventional interarticular cortisone injection did not help much.  We will try targeted injection into the bursa along the area of pain which should help.  Additionally we will arrange for a medial off loader knee brace which should help as well especially given some of the instability, laxity and pain due to DJD. Additionally will refer to orthopedic surgery to have surgical consultation.  He does not have enough DJD that I think a knee replacement makes sense but it is possible that meniscus debridement could help.    PDMP not reviewed this encounter. Orders Placed This Encounter  Procedures   [place] LIMITED JOINT SPACE STRUCTURES UP RIGHT(NO LINKED CHARGES)    Order Specific Question:   Reason for Exam (SYMPTOM  OR DIAGNOSIS REQUIRED)    Answer:   rt knee inj    Order Specific Question:    Preferred imaging location?    Answer:   [facility] referral to Orthopedic Surgery    Referral Priority:   Routine    Referral Type:   Surgical    Referral Reason:   Specialty Services Required    Requested Specialty:   Orthopedic Surgery    Number of Visits Requested:   1   Meds ordered this encounter  Medications   HYDROcodone-acetaminophen (NORCO/VICODIN) 5-325 MG tablet    Sig: Take 1 tablet by mouth every 8 (eight) hours as needed for moderate pain.    Dispense:  15 tablet    Refill:  0     Discussed warning signs or symptoms. Please see discharge instructions. Patient expresses understanding.   The above documentation has been reviewed and is accurate and complete [name], M.D.

## 2022-01-19 ENCOUNTER — Ambulatory Visit: Payer: Self-pay

## 2022-01-19 ENCOUNTER — Ambulatory Visit (INDEPENDENT_AMBULATORY_CARE_PROVIDER_SITE_OTHER): Payer: BC Managed Care – PPO | Admitting: Family Medicine

## 2022-01-19 VITALS — BP 162/92 | HR 63 | Ht 69.0 in | Wt 254.0 lb

## 2022-01-19 DIAGNOSIS — M25561 Pain in right knee: Secondary | ICD-10-CM | POA: Diagnosis not present

## 2022-01-19 DIAGNOSIS — M1711 Unilateral primary osteoarthritis, right knee: Secondary | ICD-10-CM | POA: Diagnosis not present

## 2022-01-19 DIAGNOSIS — G8929 Other chronic pain: Secondary | ICD-10-CM

## 2022-01-19 MED ORDER — HYDROCODONE-ACETAMINOPHEN 5-325 MG PO TABS: 1.0000 | ORAL_TABLET | Freq: Three times a day (TID) | ORAL | 0 refills | Status: DC | PRN

## 2022-01-19 NOTE — Patient Instructions (Addendum)
Thank you for coming in today.   You received an injection today. Seek immediate medical attention if the joint becomes red, extremely painful, or is oozing fluid.   Plan for the knee brace. Donald Gilbert will call you.   You should also hear from Ortho surgery about a consult.   Let me know how it goes and if you need anything.

## 2022-01-28 ENCOUNTER — Telehealth: Payer: Self-pay | Admitting: Family Medicine

## 2022-01-28 NOTE — Telephone Encounter (Signed)
Patient called requesting a refill on HYDROcodone-acetaminophen (NORCO/VICODIN) 5-325 MG tablet to be sent to the [facility] in [place]. He is headed home so he would need it sent to [place].

## 2022-01-29 ENCOUNTER — Encounter: Payer: Self-pay | Admitting: Orthopaedic Surgery

## 2022-01-29 ENCOUNTER — Ambulatory Visit (INDEPENDENT_AMBULATORY_CARE_PROVIDER_SITE_OTHER): Payer: BC Managed Care – PPO | Admitting: Orthopaedic Surgery

## 2022-01-29 DIAGNOSIS — M1711 Unilateral primary osteoarthritis, right knee: Secondary | ICD-10-CM | POA: Diagnosis not present

## 2022-01-29 DIAGNOSIS — S83241A Other tear of medial meniscus, current injury, right knee, initial encounter: Secondary | ICD-10-CM

## 2022-01-29 MED ORDER — HYDROCODONE-ACETAMINOPHEN 5-325 MG PO TABS: 1.0000 | ORAL_TABLET | Freq: Three times a day (TID) | ORAL | 0 refills | Status: DC | PRN

## 2022-01-29 NOTE — Telephone Encounter (Signed)
Done

## 2022-01-29 NOTE — Telephone Encounter (Signed)
Pt aware.

## 2022-01-29 NOTE — Progress Notes (Signed)
Office Visit Note   Patient: Donald Gilbert           Date of Birth: August 30, 1955           MRN: [id number] Visit Date: 01/29/2022              Requested by: Donald Gilbert [address] [place],  [place] [address] PCP: Donald Dark, Gilbert   Assessment & Plan: Visit Diagnoses:  1. Acute medial meniscus tear of right knee, initial encounter   2. Primary osteoarthritis of right knee     Plan: MRI scan was reviewed with the patient in detail.  He does have a medial meniscus tear as well as moderate chondromalacia of the medial compartment.  He has small amount of reactive subchondral edema of the medial tibial plateau.  Disease process reviewed and treatment options were discussed to include continue conservative management including injections, physical therapy, unloader brace versus arthroscopy versus arthroplasty and their associated pros and cons.  Based on these findings I have recommended arthroscopy as his chondromalacia is not grade 4 and his joint spaces are still well preserved on x-ray.  He will think about his options and let me know.  I have given him my card.  Follow-Up Instructions: No follow-ups on file.   Orders:  No orders of the defined types were placed in this encounter.  No orders of the defined types were placed in this encounter.     Procedures: No procedures performed   Clinical Data: No additional findings.   Subjective: Chief Complaint  Patient presents with   Right Knee - Pain    HPI Donald Gilbert is a very pleasant 66 year old gentleman here with his wife today for referral from Dr. Denyse Gilbert for right knee medial meniscus tear found on MRI recently.  Had pain for about 2 months without any injuries.  Denies any swelling catching symptoms.  Pain is worse with increased activity and driving and lying down at night.  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  Has mainly medial pain.  Has had 1 intra-articular injection and 1 pes bursa  injection both without much relief.  Review of Systems  Constitutional: Negative.   All other systems reviewed and are negative.    Objective: Vital Signs: There were no vitals taken for this visit.  Physical Exam Vitals and nursing note reviewed.  Constitutional:      Appearance: He is well-developed.  HENT:     Head: Normocephalic and atraumatic.  Eyes:     Pupils: Pupils are equal, round, and reactive to light.  Pulmonary:     Effort: Pulmonary effort is normal.  Abdominal:     Palpations: Abdomen is soft.  Musculoskeletal:        General: Normal range of motion.     Cervical back: Neck supple.  Skin:    General: Skin is warm.  Neurological:     Mental Status: He is alert and oriented to person, place, and time.  Psychiatric:        Behavior: Behavior normal.        Thought Content: Thought content normal.        Judgment: Judgment normal.     Ortho Exam Examination of right knee shows no significant effusion.  Medial joint line tenderness.  Negative McMurray.  Collaterals and cruciates are stable.  Mildly restricted range of motion. Specialty Comments:  No specialty comments available.  Imaging: No results found.   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   Former smoker 01/19/2019   Difficult intubation    OSA on CPAP 05/30/2018   Vitamin D deficiency 04/03/2017   Obesity 04/03/2017   Essential hypertension 04/03/2017   Mixed hyperlipidemia 04/03/2017   Recurrent kidney stones 04/03/2017   Arthralgia of multiple joints 04/03/2017   Atopic dermatitis 04/03/2017   Dysthymia 04/03/2017   Past Medical History:  Diagnosis Date   Allergy    mild    Arthralgia of multiple joints 04/03/2017   Arthritis     Atopic dermatitis 04/03/2017   BPPV (benign paroxysmal positional vertigo)    Chickenpox    Constipation    maybe twice a week due to job    Diabetes mellitus without complication (HCC)    type 2   Difficult intubation    Dysthymia 04/03/2017   History of kidney stones    recurrent   Hyperlipidemia    Hypertension    Insulin resistance 04/03/2017   Nasal polyp    OSA on CPAP    Sleep apnea    wears cpap   11cm H20   Urinary hesitancy 04/03/2017   Vitamin D deficiency 04/03/2017    Family History  Problem Relation Age of Onset   Hypertension Mother    Colon cancer Neg Hx    Colon polyps Neg Hx    Esophageal cancer Neg Hx    Rectal cancer Neg Hx    Stomach cancer Neg Hx     Past Surgical History:  Procedure Laterality Date   COLONOSCOPY  03/2008   no precancerous polyps   COLONOSCOPY WITH PROPOFOL N/A 10/02/2018   Procedure: COLONOSCOPY WITH PROPOFOL;  Surgeon: Sherrilyn Rist, Gilbert;  Location: [facility];  Service: Gastroenterology;  Laterality: N/A;   LAPAROSCOPIC GASTRIC SLEEVE RESECTION N/A 01/22/2019   Procedure: LAPAROSCOPIC GASTRIC SLEEVE RESECTION AND HIATAL HERNIA REPAIR, Upper Endo, Eras Pathway;  Surgeon: Gaynelle Adu, Gilbert;  Location: [facility];  Service: General;  Laterality: N/A;   LASIK     TONSILLECTOMY     UMBILICAL HERNIA REPAIR     2016   uvula removed      Social History   Occupational History    Employer: Educational psychologist  Tobacco Use   Smoking status: Former   Smokeless tobacco: Former    Types: Chew    Quit date: 2001  Vaping Use   Vaping Use: Never used  Substance and Sexual Activity   Alcohol use: Not Currently    Comment: OCC    Drug use: No   Sexual activity: Yes

## 2022-02-03 ENCOUNTER — Ambulatory Visit: Payer: BC Managed Care – PPO | Admitting: Family Medicine

## 2022-02-10 ENCOUNTER — Encounter: Payer: Self-pay | Admitting: Orthopaedic Surgery

## 2022-02-10 DIAGNOSIS — S83241A Other tear of medial meniscus, current injury, right knee, initial encounter: Secondary | ICD-10-CM | POA: Insufficient documentation

## 2022-02-15 ENCOUNTER — Telehealth: Payer: Self-pay | Admitting: Family Medicine

## 2022-02-15 NOTE — Telephone Encounter (Signed)
Patient called requesting a refill on HYDROcodone-acetaminophen (NORCO/VICODIN) 5-325 MG tablet  to be sent to [facility] in [place].

## 2022-02-16 MED ORDER — HYDROCODONE-ACETAMINOPHEN 5-325 MG PO TABS: 1.0000 | ORAL_TABLET | Freq: Three times a day (TID) | ORAL | 0 refills | Status: DC | PRN

## 2022-02-16 NOTE — Telephone Encounter (Signed)
Done

## 2022-02-17 DIAGNOSIS — M1711 Unilateral primary osteoarthritis, right knee: Secondary | ICD-10-CM | POA: Diagnosis not present

## 2022-02-20 DIAGNOSIS — G4733 Obstructive sleep apnea (adult) (pediatric): Secondary | ICD-10-CM | POA: Diagnosis not present

## 2022-03-01 ENCOUNTER — Other Ambulatory Visit: Payer: Self-pay | Admitting: Physician Assistant

## 2022-03-01 MED ORDER — ONDANSETRON HCL 4 MG PO TABS: 4.0000 mg | ORAL_TABLET | Freq: Three times a day (TID) | ORAL | 0 refills | Status: AC | PRN

## 2022-03-01 MED ORDER — HYDROCODONE-ACETAMINOPHEN 5-325 MG PO TABS: 1.0000 | ORAL_TABLET | Freq: Three times a day (TID) | ORAL | 0 refills | Status: DC | PRN

## 2022-03-04 ENCOUNTER — Encounter: Payer: Self-pay | Admitting: Orthopaedic Surgery

## 2022-03-04 DIAGNOSIS — M94261 Chondromalacia, right knee: Secondary | ICD-10-CM | POA: Diagnosis not present

## 2022-03-04 DIAGNOSIS — S83231A Complex tear of medial meniscus, current injury, right knee, initial encounter: Secondary | ICD-10-CM | POA: Diagnosis not present

## 2022-03-08 ENCOUNTER — Other Ambulatory Visit: Payer: Self-pay | Admitting: Family Medicine

## 2022-03-11 ENCOUNTER — Encounter: Payer: Self-pay | Admitting: Physician Assistant

## 2022-03-11 ENCOUNTER — Ambulatory Visit (INDEPENDENT_AMBULATORY_CARE_PROVIDER_SITE_OTHER): Payer: BC Managed Care – PPO | Admitting: Physician Assistant

## 2022-03-11 DIAGNOSIS — Z96641 Presence of right artificial hip joint: Secondary | ICD-10-CM

## 2022-03-11 NOTE — Progress Notes (Signed)
Post-Op Visit Note   Patient: Donald Gilbert           Date of Birth: 10/13/1955           MRN: [id number] Visit Date: 03/11/2022 PCP: Ardith Dark, MD   Assessment & Plan:  Chief Complaint:  Chief Complaint  Patient presents with   Right Knee - Follow-up    Right knee arthroscopy 03/04/2022   Visit Diagnoses:  1. History of total hip replacement, right     Plan: Patient is a pleasant 66 year old gentleman who comes in today 1 week status post right knee arthroscopic debridement medial meniscus and chondroplasty 03/04/2022.  It was noted during operative invention he had moderate degenerative changes the medial compartment.  He is here today doing well.  He is taking ibuprofen as needed for pain.  Examination of his right knee reveals fully healed surgical portals with nylon sutures in place.  No evidence of infection or cellulitis.  Calves are soft nontender.  He does have a fair amount of ecchymosis to the medial thigh.  Today, sutures were removed and Steri-Strips applied.  Intraoperative pictures reviewed.  Home exercise program provided.  He will follow-up with [place] in 5 weeks for recheck.  Call with concerns or questions in the meantime.  Follow-Up Instructions: Return in about 5 weeks (around 04/15/2022).   Orders:  No orders of the defined types were placed in this encounter.  No orders of the defined types were placed in this encounter.   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   Former smoker 01/19/2019   Difficult intubation    OSA on CPAP 05/30/2018   Vitamin D deficiency 04/03/2017   Obesity 04/03/2017   Essential  hypertension 04/03/2017   Mixed hyperlipidemia 04/03/2017   Recurrent kidney stones 04/03/2017   Arthralgia of multiple joints 04/03/2017   Atopic dermatitis 04/03/2017   Dysthymia 04/03/2017   Past Medical History:  Diagnosis Date   Allergy    mild    Arthralgia of multiple joints 04/03/2017   Arthritis    Atopic dermatitis 04/03/2017   BPPV (benign paroxysmal positional vertigo)    Chickenpox    Constipation    maybe twice a week due to job    Diabetes mellitus without complication (HCC)    type 2   Difficult intubation    Dysthymia 04/03/2017   History of kidney stones    recurrent   Hyperlipidemia    Hypertension    Insulin resistance 04/03/2017   Nasal polyp    OSA on CPAP    Sleep apnea    wears cpap   11cm H20   Urinary hesitancy 04/03/2017   Vitamin D deficiency 04/03/2017    Family History  Problem Relation Age of Onset   Hypertension Mother    Colon cancer Neg Hx    Colon polyps Neg Hx    Esophageal cancer Neg Hx    Rectal cancer Neg Hx    Stomach cancer Neg Hx     Past Surgical History:  Procedure Laterality Date   COLONOSCOPY  03/2008   no precancerous polyps   COLONOSCOPY WITH PROPOFOL N/A 10/02/2018   Procedure:  COLONOSCOPY WITH PROPOFOL;  Surgeon: Sherrilyn Rist, MD;  Location: [facility];  Service: Gastroenterology;  Laterality: N/A;   LAPAROSCOPIC GASTRIC SLEEVE RESECTION N/A 01/22/2019   Procedure: LAPAROSCOPIC GASTRIC SLEEVE RESECTION AND HIATAL HERNIA REPAIR, Upper Endo, Eras Pathway;  Surgeon: Gaynelle Adu, MD;  Location: [facility];  Service: General;  Laterality: N/A;   LASIK     TONSILLECTOMY     UMBILICAL HERNIA REPAIR     2016   uvula removed      Social History   Occupational History    Employer: Educational psychologist  Tobacco Use   Smoking status: Former   Smokeless tobacco: Former    Types: Chew    Quit date: 2001  Vaping Use   Vaping Use: Never used  Substance and Sexual Activity   Alcohol use: Not Currently     Comment: OCC    Drug use: No   Sexual activity: Yes

## 2022-03-23 DIAGNOSIS — G4733 Obstructive sleep apnea (adult) (pediatric): Secondary | ICD-10-CM | POA: Diagnosis not present

## 2022-03-31 ENCOUNTER — Other Ambulatory Visit (HOSPITAL_BASED_OUTPATIENT_CLINIC_OR_DEPARTMENT_OTHER): Payer: Self-pay

## 2022-04-02 ENCOUNTER — Telehealth: Payer: Self-pay | Admitting: Family Medicine

## 2022-04-02 ENCOUNTER — Other Ambulatory Visit: Payer: Self-pay

## 2022-04-02 ENCOUNTER — Other Ambulatory Visit: Payer: Self-pay | Admitting: *Deleted

## 2022-04-02 NOTE — Telephone Encounter (Unsigned)
  Encourage patient to contact the pharmacy for refills or they can request refills through [facility] [place]:  Please schedule appointment if longer than 1 year  NEXT APPOINTMENT DATE:  MEDICATION:   Semaglutide-Weight Management (WEGOVY) 0.5 MG/0.5ML SOAJ   Is the patient out of medication? YES  PHARMACY:   Let patient know to contact pharmacy at the end of the day to make sure medication is ready.  Please notify patient to allow 48-72 hours to process

## 2022-04-02 NOTE — Telephone Encounter (Signed)
Patient requests the RX for Wegovy listed in previous message be sent to:  [facility] Phone: [phone]  Fax: [phone]    [address]

## 2022-04-05 ENCOUNTER — Other Ambulatory Visit (HOSPITAL_BASED_OUTPATIENT_CLINIC_OR_DEPARTMENT_OTHER): Payer: Self-pay

## 2022-04-05 ENCOUNTER — Other Ambulatory Visit: Payer: Self-pay | Admitting: *Deleted

## 2022-04-05 MED ORDER — WEGOVY 0.5 MG/0.5ML ~~LOC~~ SOAJ: 0.5000 mg | SUBCUTANEOUS | 0 refills | Status: DC

## 2022-04-05 MED ORDER — WEGOVY 0.5 MG/0.5ML ~~LOC~~ SOAJ
0.5000 mg | SUBCUTANEOUS | 0 refills | Status: DC
  Filled 2022-04-05 – 2022-04-15 (×2): qty 2, 28d supply, fill #0

## 2022-04-05 NOTE — Telephone Encounter (Signed)
Rx Wegovy send to [facility]

## 2022-04-15 ENCOUNTER — Other Ambulatory Visit (HOSPITAL_BASED_OUTPATIENT_CLINIC_OR_DEPARTMENT_OTHER): Payer: Self-pay

## 2022-04-16 ENCOUNTER — Ambulatory Visit (INDEPENDENT_AMBULATORY_CARE_PROVIDER_SITE_OTHER): Payer: BC Managed Care – PPO | Admitting: Family Medicine

## 2022-04-16 ENCOUNTER — Ambulatory Visit (INDEPENDENT_AMBULATORY_CARE_PROVIDER_SITE_OTHER): Payer: BC Managed Care – PPO | Admitting: Orthopaedic Surgery

## 2022-04-16 ENCOUNTER — Encounter: Payer: Self-pay | Admitting: Family Medicine

## 2022-04-16 ENCOUNTER — Ambulatory Visit: Payer: Self-pay

## 2022-04-16 VITALS — BP 161/72 | Temp 97.5°F | Ht 69.0 in | Wt 257.0 lb

## 2022-04-16 DIAGNOSIS — H6990 Unspecified Eustachian tube disorder, unspecified ear: Secondary | ICD-10-CM

## 2022-04-16 DIAGNOSIS — Z9889 Other specified postprocedural states: Secondary | ICD-10-CM

## 2022-04-16 DIAGNOSIS — I10 Essential (primary) hypertension: Secondary | ICD-10-CM | POA: Diagnosis not present

## 2022-04-16 DIAGNOSIS — E669 Obesity, unspecified: Secondary | ICD-10-CM | POA: Diagnosis not present

## 2022-04-16 MED ORDER — AZELASTINE-FLUTICASONE 137-50 MCG/ACT NA SUSP: 1.0000 | Freq: Two times a day (BID) | NASAL | 5 refills | Status: DC

## 2022-04-16 MED ORDER — AMLODIPINE BESYLATE 5 MG PO TABS: 5.0000 mg | ORAL_TABLET | Freq: Every day | ORAL | 3 refills | Status: DC

## 2022-04-16 NOTE — Assessment & Plan Note (Signed)
Home readings are above goal.  He has been compliant with losartan 100 mg daily.  Will add on amlodipine 5 mg daily.  He will continue to monitor at home over the next couple of weeks.  We also are working on weight loss as above which should hopefully help some with his blood pressure.  We discussed lifestyle modifications.  He will with me in a couple of weeks via MyChart and we can titrate meds as needed.

## 2022-04-16 NOTE — Patient Instructions (Addendum)
It was very nice to see you today!  Please start the amlodipine. Keep an eye on your BP and check in with [place] in a few weeks.  Please stop using the afrin.  We can try the dymysta. This will help with the pressure in your ears.  Please let me know if this is too expensive and we can send in a separate prescriptions.  Take care, Dr Jerline Pain  PLEASE NOTE:  If you had any lab tests, please let us know if you have not heard back within a few days. You may see your results on mychart before we have a chance to review them but we will give you a call once they are reviewed by [place].   If we ordered any referrals today, please let us know if you have not heard from their office within the next week.   If you had any urgent prescriptions sent in today, please check with the pharmacy within an hour of our visit to make sure the prescription was transmitted appropriately.   Please try these tips to maintain a healthy lifestyle:  Eat at least 3 REAL meals and 1-2 snacks per day.  Aim for no more than 5 hours between eating.  If you eat breakfast, please do so within one hour of getting up.   Each meal should contain half fruits/vegetables, one quarter protein, and one quarter carbs (no bigger than a computer mouse)  Cut down on sweet beverages. This includes juice, soda, and sweet tea.   Drink at least 1 glass of water with each meal and aim for at least 8 glasses per day  Exercise at least 150 minutes every week.

## 2022-04-16 NOTE — Progress Notes (Signed)
   Donald Gilbert is a 67 y.o. male who presents today for an office visit.  Assessment/Plan:  New/Acute Problems: Eustachian Tube Dysfunction No signs of tympanic membrane perforation today.  Does have some slight ear effusion.  Will be starting Dymista.  He will let me know if not improving in the next couple of weeks.  May consider trial of antihistamine such as Claritin or Zyrtec.  Rhinitis Medicamentosa  Discussed importance of avoiding prolonged use of Afrin.  Will be sending a prescription for Dymista that he can use instead.  He will let me know if he has any issues as he is coming off the Afrin.  Chronic Problems Addressed Today: Obesity He was unable to get [facility] approved and was able to pick up at the pharmacy.  Is not yet started this.  He is not sure if he has a 2.5 for the 5 mg dose.  He will check when he gets home.  Discussed with patient would probably be ideal for him to start with 2.5 mg weekly for 4 weeks before we increase to 5 mg weekly.  Essential hypertension Home readings are above goal.  He has been compliant with losartan 100 mg daily.  Will add on amlodipine 5 mg daily.  He will continue to monitor at home over the next couple of weeks.  We also are working on weight loss as above which should hopefully help some with his blood pressure.  We discussed lifestyle modifications.  He will with me in a couple of weeks via MyChart and we can titrate meds as needed.     Subjective:  HPI:  See A/p for status of chronic conditions.  He main concern today is right ear popping.  This started about 4 weeks ago. Was blowing his nose when he felt his ear pop. Has had some crackling. No pain. No hearing issues.   He has been keeping his blood pressures at home and they have been ranging from the 140s to 150s over 80s.  He has also been having some issues with nasal congestion.  Typically uses 1 spray of Afrin nightly while using his CPAP.      Objective:  Physical  Exam: BP (!) 161/72   Temp (!) 97.5 F (36.4 C) (Temporal)   Ht 5\' 9"  (1.753 m)   Wt 257 lb (116.6 kg)   SpO2 94%   BMI 37.95 kg/m   Gen: No acute distress, resting comfortably HEENT: Right TM with erythema and clear effusion.  Left TM clear with otosclerosis. CV: Regular rate and rhythm with no murmurs appreciated Pulm: Normal work of breathing, clear to auscultation bilaterally with no crackles, wheezes, or rhonchi Neuro: Grossly normal, moves all extremities Psych: Normal affect and thought content      Adasyn Mcadams M. Jerline Pain, MD 04/16/2022 11:25 AM

## 2022-04-16 NOTE — Progress Notes (Signed)
Post-Op Visit Note   Patient: Donald Gilbert           Date of Birth: [birth year]           MRN: [id number] Visit Date: 04/16/2022 PCP: Donald Barrack, MD   Assessment & Plan:  Chief Complaint:  Chief Complaint  Patient presents with   Right Knee - Routine Post Op   Visit Diagnoses:  1. S/P arthroscopy of right knee     Plan: Donald Gilbert is 5 weeks status post right knee scope.  He is doing well.  He states that only feels a little bit of soreness towards the end of the day.  He has been back to work.  Examination right knee shows fully healed surgical scars with excellent range of motion.  No joint line tenderness.  No joint effusion.  Patient has made a great recovery from the surgery.  I encouraged the use of medial unloader brace while at work and during activity.  From my standpoint he can follow-up with [place] as needed.  Follow-Up Instructions: Return if symptoms worsen or fail to improve.   Orders:  Orders Placed This Encounter  Procedures   XR KNEE 3 VIEW RIGHT   No orders of the defined types were placed in this encounter.   Imaging: No results found.  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   Former smoker 01/19/2019   Difficult intubation    OSA on CPAP 05/30/2018   Vitamin D deficiency 04/03/2017   Obesity 04/03/2017   Essential hypertension 04/03/2017   Mixed hyperlipidemia 04/03/2017   Recurrent kidney stones 04/03/2017   Arthralgia of multiple joints 04/03/2017   Atopic dermatitis 04/03/2017   Dysthymia 04/03/2017   Past Medical History:  Diagnosis Date   Allergy    mild    Arthralgia of multiple joints 04/03/2017   Arthritis     Atopic dermatitis 04/03/2017   BPPV (benign paroxysmal positional vertigo)    Chickenpox    Constipation    maybe twice a week due to job    Diabetes mellitus without complication ([place])    type 2   Difficult intubation    Dysthymia 04/03/2017   History of kidney stones    recurrent   Hyperlipidemia    Hypertension    Insulin resistance 04/03/2017   Nasal polyp    OSA on CPAP    Sleep apnea    wears cpap   11cm H20   Urinary hesitancy 04/03/2017   Vitamin D deficiency 04/03/2017    Family History  Problem Relation Age of Onset   Hypertension Mother    Colon cancer Neg Hx    Colon polyps Neg Hx    Esophageal cancer Neg Hx    Rectal cancer Neg Hx    Stomach cancer Neg Hx     Past Surgical History:  Procedure Laterality Date   COLONOSCOPY  03/2008   no precancerous polyps   COLONOSCOPY WITH PROPOFOL N/A 10/02/2018   Procedure: COLONOSCOPY WITH PROPOFOL;  Surgeon: Donald Stabler, MD;  Location: [facility];  Service: Gastroenterology;  Laterality: N/A;   LAPAROSCOPIC GASTRIC SLEEVE RESECTION N/A 01/22/2019   Procedure: LAPAROSCOPIC GASTRIC SLEEVE RESECTION AND HIATAL  HERNIA REPAIR, Upper Endo, Eras Pathway;  Surgeon: Donald Pickerel, MD;  Location: [facility];  Service: General;  Laterality: N/A;   LASIK     TONSILLECTOMY     UMBILICAL HERNIA REPAIR     2016   uvula removed      Social History   Occupational History    Employer: [employer] educational needs teacher  Tobacco Use   Smoking status: Former   Smokeless tobacco: Former    Types: Chew    Quit date: 2001  Vaping Use   Vaping Use: Never used  Substance and Sexual Activity   Alcohol use: Not Currently    Comment: OCC    Drug use: No   Sexual activity: Yes

## 2022-04-16 NOTE — Assessment & Plan Note (Signed)
He was unable to get [facility] approved and was able to pick up at the pharmacy.  Is not yet started this.  He is not sure if he has a 2.5 for the 5 mg dose.  He will check when he gets home.  Discussed with patient would probably be ideal for him to start with 2.5 mg weekly for 4 weeks before we increase to 5 mg weekly.

## 2022-04-22 DIAGNOSIS — G4733 Obstructive sleep apnea (adult) (pediatric): Secondary | ICD-10-CM | POA: Diagnosis not present

## 2022-04-26 DIAGNOSIS — G4733 Obstructive sleep apnea (adult) (pediatric): Secondary | ICD-10-CM | POA: Diagnosis not present

## 2022-04-29 ENCOUNTER — Encounter: Payer: Self-pay | Admitting: Family Medicine

## 2022-04-29 ENCOUNTER — Other Ambulatory Visit: Payer: Self-pay | Admitting: Family Medicine

## 2022-04-30 ENCOUNTER — Other Ambulatory Visit (HOSPITAL_BASED_OUTPATIENT_CLINIC_OR_DEPARTMENT_OTHER): Payer: Self-pay

## 2022-04-30 MED ORDER — WEGOVY 0.5 MG/0.5ML ~~LOC~~ SOAJ
0.5000 mg | SUBCUTANEOUS | 0 refills | Status: DC
  Filled 2022-05-14: qty 2, 28d supply, fill #0

## 2022-04-30 NOTE — Telephone Encounter (Signed)
See note

## 2022-04-30 NOTE — Telephone Encounter (Signed)
I appreciate the update. Looks like it is starting to trend down since starting the amlodipine. HE can continue to monitor for another few weeks and check in with [place] on mychart.  Donald Gilbert. Jerline Pain, MD 04/30/2022 3:46 PM

## 2022-05-07 ENCOUNTER — Other Ambulatory Visit: Payer: Self-pay | Admitting: *Deleted

## 2022-05-07 ENCOUNTER — Telehealth: Payer: Self-pay | Admitting: Family Medicine

## 2022-05-07 MED ORDER — AZELASTINE-FLUTICASONE 137-50 MCG/ACT NA SUSP: 1.0000 | Freq: Two times a day (BID) | NASAL | 0 refills | Status: DC

## 2022-05-07 NOTE — Telephone Encounter (Signed)
Rx send to pharmacy  

## 2022-05-07 NOTE — Telephone Encounter (Signed)
Patient states: - He was out of town when he dropped his nasal spray, Azelastine-Fluticasone 137-50 MCG/ACT SUSP , and the bottle broke. Patient is requesting a new one be sent to [facility] at [address] [place], [place] [address]

## 2022-05-14 ENCOUNTER — Other Ambulatory Visit (HOSPITAL_BASED_OUTPATIENT_CLINIC_OR_DEPARTMENT_OTHER): Payer: Self-pay

## 2022-05-21 DIAGNOSIS — G4733 Obstructive sleep apnea (adult) (pediatric): Secondary | ICD-10-CM | POA: Diagnosis not present

## 2022-06-09 ENCOUNTER — Other Ambulatory Visit: Payer: Self-pay | Admitting: Family Medicine

## 2022-06-15 ENCOUNTER — Other Ambulatory Visit: Payer: Self-pay | Admitting: *Deleted

## 2022-06-15 ENCOUNTER — Other Ambulatory Visit: Payer: Self-pay

## 2022-06-15 ENCOUNTER — Telehealth: Payer: Self-pay | Admitting: Family Medicine

## 2022-06-15 MED ORDER — WEGOVY 1 MG/0.5ML ~~LOC~~ SOAJ: 1.0000 mg | SUBCUTANEOUS | 1 refills | Status: DC

## 2022-06-15 NOTE — Telephone Encounter (Signed)
Ok to increase to 1mg  weekly.  Algis Greenhouse. Jerline Pain, MD 06/15/2022 10:51 AM

## 2022-06-15 NOTE — Telephone Encounter (Signed)
Please advise 

## 2022-06-15 NOTE — Telephone Encounter (Signed)
New Rx send to pharmacy

## 2022-06-15 NOTE — Telephone Encounter (Signed)
Patient requests dosage for the following medication be increased  LAST APPOINTMENT DATE: 04/16/22  NEXT APPOINTMENT DATE:  MEDICATION:  Semaglutide-Weight Management (WEGOVY) 0.5 MG/0.5ML SOAJ  Requests increased dosage  Is the patient out of medication? Yes  PHARMACY:  [place] Phone: [phone]  Fax: [phone]

## 2022-06-16 ENCOUNTER — Other Ambulatory Visit (HOSPITAL_BASED_OUTPATIENT_CLINIC_OR_DEPARTMENT_OTHER): Payer: Self-pay

## 2022-06-16 ENCOUNTER — Other Ambulatory Visit: Payer: Self-pay

## 2022-06-16 MED ORDER — WEGOVY 1 MG/0.5ML ~~LOC~~ SOAJ
1.0000 mg | SUBCUTANEOUS | 1 refills | Status: DC
  Filled 2022-06-16: qty 2, 28d supply, fill #0
  Filled 2022-07-09: qty 2, 28d supply, fill #1

## 2022-06-16 NOTE — Telephone Encounter (Signed)
Wegovy 1 mg sent to [facility]

## 2022-06-16 NOTE — Telephone Encounter (Signed)
Please send RX to correct Pharmacy listed in previous message.

## 2022-06-21 DIAGNOSIS — G4733 Obstructive sleep apnea (adult) (pediatric): Secondary | ICD-10-CM | POA: Diagnosis not present

## 2022-07-22 DIAGNOSIS — G4733 Obstructive sleep apnea (adult) (pediatric): Secondary | ICD-10-CM | POA: Diagnosis not present

## 2022-07-25 DIAGNOSIS — G4733 Obstructive sleep apnea (adult) (pediatric): Secondary | ICD-10-CM | POA: Diagnosis not present

## 2022-08-04 ENCOUNTER — Other Ambulatory Visit: Payer: Self-pay | Admitting: Family Medicine

## 2022-08-05 ENCOUNTER — Other Ambulatory Visit (HOSPITAL_BASED_OUTPATIENT_CLINIC_OR_DEPARTMENT_OTHER): Payer: Self-pay

## 2022-08-05 MED ORDER — WEGOVY 1 MG/0.5ML ~~LOC~~ SOAJ
1.0000 mg | SUBCUTANEOUS | 1 refills | Status: DC
  Filled 2022-08-05: qty 2, 28d supply, fill #0
  Filled 2022-08-25 – 2022-08-26 (×2): qty 2, 28d supply, fill #1

## 2022-08-22 DIAGNOSIS — G4733 Obstructive sleep apnea (adult) (pediatric): Secondary | ICD-10-CM | POA: Diagnosis not present

## 2022-08-24 ENCOUNTER — Other Ambulatory Visit: Payer: Self-pay | Admitting: Family Medicine

## 2022-08-25 ENCOUNTER — Other Ambulatory Visit (HOSPITAL_BASED_OUTPATIENT_CLINIC_OR_DEPARTMENT_OTHER): Payer: Self-pay

## 2022-08-26 ENCOUNTER — Encounter (HOSPITAL_COMMUNITY): Payer: Self-pay | Admitting: *Deleted

## 2022-08-31 ENCOUNTER — Other Ambulatory Visit: Payer: Self-pay | Admitting: Family Medicine

## 2022-09-26 ENCOUNTER — Other Ambulatory Visit: Payer: Self-pay | Admitting: Family Medicine

## 2022-09-27 ENCOUNTER — Other Ambulatory Visit (HOSPITAL_BASED_OUTPATIENT_CLINIC_OR_DEPARTMENT_OTHER): Payer: Self-pay

## 2022-09-27 MED ORDER — WEGOVY 1 MG/0.5ML ~~LOC~~ SOAJ
1.0000 mg | SUBCUTANEOUS | 1 refills | Status: DC
  Filled 2022-09-27: qty 2, 28d supply, fill #0
  Filled 2022-10-23: qty 2, 28d supply, fill #1

## 2022-11-01 ENCOUNTER — Other Ambulatory Visit: Payer: Self-pay | Admitting: Family Medicine

## 2022-11-01 DIAGNOSIS — N200 Calculus of kidney: Secondary | ICD-10-CM

## 2022-11-17 ENCOUNTER — Other Ambulatory Visit: Payer: Self-pay | Admitting: Family Medicine

## 2022-11-18 ENCOUNTER — Other Ambulatory Visit (HOSPITAL_BASED_OUTPATIENT_CLINIC_OR_DEPARTMENT_OTHER): Payer: Self-pay

## 2022-11-18 MED ORDER — WEGOVY 1 MG/0.5ML ~~LOC~~ SOAJ
1.0000 mg | SUBCUTANEOUS | 1 refills | Status: DC
  Filled 2022-11-18: qty 2, 28d supply, fill #0
  Filled 2022-12-16: qty 2, 28d supply, fill #1

## 2022-12-23 ENCOUNTER — Ambulatory Visit (INDEPENDENT_AMBULATORY_CARE_PROVIDER_SITE_OTHER): Payer: BC Managed Care – PPO

## 2022-12-23 DIAGNOSIS — Z23 Encounter for immunization: Secondary | ICD-10-CM

## 2023-01-07 ENCOUNTER — Encounter: Payer: Self-pay | Admitting: Family Medicine

## 2023-01-07 ENCOUNTER — Ambulatory Visit (INDEPENDENT_AMBULATORY_CARE_PROVIDER_SITE_OTHER): Payer: BC Managed Care – PPO | Admitting: Family Medicine

## 2023-01-07 ENCOUNTER — Other Ambulatory Visit (HOSPITAL_BASED_OUTPATIENT_CLINIC_OR_DEPARTMENT_OTHER): Payer: Self-pay

## 2023-01-07 VITALS — BP 144/73 | HR 70 | Temp 97.7°F | Ht 69.0 in | Wt 238.5 lb

## 2023-01-07 DIAGNOSIS — I77819 Aortic ectasia, unspecified site: Secondary | ICD-10-CM

## 2023-01-07 DIAGNOSIS — N200 Calculus of kidney: Secondary | ICD-10-CM

## 2023-01-07 DIAGNOSIS — F341 Dysthymic disorder: Secondary | ICD-10-CM | POA: Diagnosis not present

## 2023-01-07 DIAGNOSIS — Z125 Encounter for screening for malignant neoplasm of prostate: Secondary | ICD-10-CM | POA: Diagnosis not present

## 2023-01-07 DIAGNOSIS — E669 Obesity, unspecified: Secondary | ICD-10-CM

## 2023-01-07 DIAGNOSIS — I10 Essential (primary) hypertension: Secondary | ICD-10-CM

## 2023-01-07 DIAGNOSIS — E782 Mixed hyperlipidemia: Secondary | ICD-10-CM

## 2023-01-07 DIAGNOSIS — Z131 Encounter for screening for diabetes mellitus: Secondary | ICD-10-CM | POA: Diagnosis not present

## 2023-01-07 DIAGNOSIS — E559 Vitamin D deficiency, unspecified: Secondary | ICD-10-CM | POA: Diagnosis not present

## 2023-01-07 DIAGNOSIS — R7303 Prediabetes: Secondary | ICD-10-CM

## 2023-01-07 LAB — COMPREHENSIVE METABOLIC PANEL
ALT: 26 U/L (ref 0–53)
AST: 18 U/L (ref 0–37)
Albumin: 4.4 g/dL (ref 3.5–5.2)
Alkaline Phosphatase: 78 U/L (ref 39–117)
BUN: 20 mg/dL (ref 6–23)
CO2: 32 meq/L (ref 19–32)
Calcium: 9.8 mg/dL (ref 8.4–10.5)
Chloride: 103 meq/L (ref 96–112)
Creatinine, Ser: 0.84 mg/dL (ref 0.40–1.50)
GFR: 91.93 mL/min (ref >60.00)
Glucose, Bld: 82 mg/dL (ref 70–99)
Potassium: 4 meq/L (ref 3.5–5.1)
Sodium: 143 meq/L (ref 135–145)
Total Bilirubin: 0.9 mg/dL (ref 0.2–1.2)
Total Protein: 6.5 g/dL (ref 6.0–8.3)

## 2023-01-07 LAB — CBC
HCT: 45.1 % (ref 39.0–52.0)
Hemoglobin: 14.8 g/dL (ref 13.0–17.0)
MCHC: 32.8 g/dL (ref 30.0–36.0)
MCV: 91.3 fL (ref 78.0–100.0)
Platelets: 204 10*3/uL (ref 150.0–400.0)
RBC: 4.93 Mil/uL (ref 4.22–5.81)
RDW: 13.8 % (ref 11.5–15.5)
WBC: 7.1 10*3/uL (ref 4.0–10.5)

## 2023-01-07 LAB — URIC ACID: Uric Acid, Serum: 3.7 mg/dL — ABNORMAL LOW (ref 4.0–7.8)

## 2023-01-07 LAB — LIPID PANEL
Cholesterol: 151 mg/dL (ref 0–200)
HDL: 50.9 mg/dL (ref >39.00)
LDL Cholesterol: 77 mg/dL (ref 0–99)
NonHDL: 100.56
Total CHOL/HDL Ratio: 3
Triglycerides: 116 mg/dL (ref 0.0–149.0)
VLDL: 23.2 mg/dL (ref 0.0–40.0)

## 2023-01-07 LAB — PSA: PSA: 2.19 ng/mL (ref 0.10–4.00)

## 2023-01-07 LAB — VITAMIN D 25 HYDROXY (VIT D DEFICIENCY, FRACTURES): VITD: 48.57 ng/mL (ref 30.00–100.00)

## 2023-01-07 LAB — TSH: TSH: 1.62 u[IU]/mL (ref 0.35–5.50)

## 2023-01-07 LAB — HEMOGLOBIN A1C: Hgb A1c MFr Bld: 5.7 % (ref 4.6–6.5)

## 2023-01-07 MED ORDER — WEGOVY 1.7 MG/0.75ML ~~LOC~~ SOAJ
1.7000 mg | Freq: Every day | SUBCUTANEOUS | 3 refills | Status: DC
  Filled 2023-01-07: qty 3, 28d supply, fill #0
  Filled 2023-02-03: qty 3, 28d supply, fill #1
  Filled 2023-02-28: qty 3, 28d supply, fill #2
  Filled 2023-03-25: qty 3, 28d supply, fill #3

## 2023-01-07 MED ORDER — AMLODIPINE BESYLATE 10 MG PO TABS
10.0000 mg | ORAL_TABLET | Freq: Every day | ORAL | 3 refills | Status: DC
  Filled 2023-01-07: qty 90, 90d supply, fill #0
  Filled 2023-03-31: qty 90, 90d supply, fill #1
  Filled 2023-06-29: qty 90, 90d supply, fill #2
  Filled 2023-09-27: qty 90, 90d supply, fill #3

## 2023-01-07 NOTE — Assessment & Plan Note (Signed)
He is down about 19 pounds over the last several months.  Doing well with Wegovy though would like to increase the dose.  He has Toller well without any significant side effects.  We will go to 1.7 mg weekly.  He will follow-up with [place] in a few weeks via MyChart.

## 2023-01-07 NOTE — Assessment & Plan Note (Signed)
Mildly elevated today.  He has had borderline elevations at home with several readings in the 120s to 130s though occasional readings in the 140s and low 150s.  Will continue losartan 100 mg daily and increase amlodipine to 10 mg daily.  Hopefully he will have continued improvement with weight loss.  He is working on regular exercise as well.  He will follow-up with [place] in a few weeks via MyChart and we can adjust meds as needed.

## 2023-01-07 NOTE — Assessment & Plan Note (Signed)
On allopurinol 300 mg daily.  Check uric acid level today.

## 2023-01-07 NOTE — Progress Notes (Signed)
   WAYDEN LINSTROM is a 67 y.o. male who presents today for an office visit.  Assessment/Plan:  New/Acute Problems: Pruritus  Exam consistent with xerosis cutis.  No signs of rash or fungal infection.  No signs systemic illness.  No red flags.  Symptoms are improving with over-the-counter Sarna.  He will continue this and let us know if he has any persistent issues.  Chronic Problems Addressed Today: Aortic dilatation ([facility]) Incidentally noted during CT scan last year with diameter measuring 4.2 cm.  Will repeat CTA per radiology recommendations.  We are adjusting antihypertensives as below.  Essential hypertension Mildly elevated today.  He has had borderline elevations at home with several readings in the 120s to 130s though occasional readings in the 140s and low 150s.  Will continue losartan 100 mg daily and increase amlodipine to 10 mg daily.  Hopefully he will have continued improvement with weight loss.  He is working on regular exercise as well.  He will follow-up with [place] in a few weeks via MyChart and we can adjust meds as needed.  Vitamin D deficiency Check Vitamin D.   Mixed hyperlipidemia We increased Lipitor to 80 mg daily last year.  He has tolerated well.  Will recheck lipids today.  Obesity He is down about 19 pounds over the last several months.  Doing well with Wegovy though would like to increase the dose.  He has Toller well without any significant side effects.  We will go to 1.7 mg weekly.  He will follow-up with [place] in a few weeks via MyChart.  Recurrent kidney stones On allopurinol 300 mg daily.  Check uric acid level today.  Prediabetes Check A1c.      Subjective:  HPI:  See Assessment / plan for status of chronic conditions. Patient here with main concern of itching on bilateral arms.  This has been going for a few weeks.  He did use some over-the-counter Sarna lotion which has helped significantly.  Symptoms have much improved over the last several days.   No redness.  No fevers or chills.  No recent illnesses.  No new exposures.  No obvious precipitating or aggravating factors.       Objective:  Physical Exam: BP (!) 144/73   Pulse 70   Temp 97.7 F (36.5 C) (Temporal)   Ht 5\' 9"  (1.753 m)   Wt 238 lb 8 oz (108.2 kg)   SpO2 97%   BMI 35.22 kg/m   Wt Readings from Last 3 Encounters:  01/07/23 238 lb 8 oz (108.2 kg)  04/16/22 257 lb (116.6 kg)  01/19/22 254 lb (115.2 kg)    Gen: No acute distress, resting comfortably CV: Regular rate and rhythm with no murmurs appreciated Pulm: Normal work of breathing, clear to auscultation bilaterally with no crackles, wheezes, or rhonchi Skin: Xerosis cutis bilateral arms. Neuro: Grossly normal, moves all extremities Psych: Normal affect and thought content      Librado Guandique M. [name], MD 01/07/2023 8:40 AM

## 2023-01-07 NOTE — Assessment & Plan Note (Signed)
We increased Lipitor to 80 mg daily last year.  He has tolerated well.  Will recheck lipids today.

## 2023-01-07 NOTE — Assessment & Plan Note (Signed)
Check Vitamin D.  

## 2023-01-07 NOTE — Patient Instructions (Addendum)
It was very nice to see you today!  We will check blood work today.  Please continue using lotion on your arm.  Let me know if this gives you any further issues.  We will repeat the CT scan of your chest.  Please increase your amlodipine to 10 mg daily.  Monitor your blood pressure at home and check in with me in a few weeks.  We will increase your Wegovy 1.7 mg daily.  Return in about 6 months (around 07/08/2023) for Follow Up.   Take care, Dr [name]  PLEASE NOTE:  If you had any lab tests, please let us know if you have not heard back within a few days. You may see your results on mychart before we have a chance to review them but we will give you a call once they are reviewed by [place].   If we ordered any referrals today, please let us know if you have not heard from their office within the next week.   If you had any urgent prescriptions sent in today, please check with the pharmacy within an hour of our visit to make sure the prescription was transmitted appropriately.   Please try these tips to maintain a healthy lifestyle:  Eat at least 3 REAL meals and 1-2 snacks per day.  Aim for no more than 5 hours between eating.  If you eat breakfast, please do so within one hour of getting up.   Each meal should contain half fruits/vegetables, one quarter protein, and one quarter carbs (no bigger than a computer mouse)  Cut down on sweet beverages. This includes juice, soda, and sweet tea.   Drink at least 1 glass of water with each meal and aim for at least 8 glasses per day  Exercise at least 150 minutes every week.

## 2023-01-07 NOTE — Assessment & Plan Note (Signed)
Incidentally noted during CT scan last year with diameter measuring 4.2 cm.  Will repeat CTA per radiology recommendations.  We are adjusting antihypertensives as below.

## 2023-01-07 NOTE — Assessment & Plan Note (Signed)
Check A1c. 

## 2023-01-11 NOTE — Progress Notes (Signed)
Great news!  Labs are all stable.  Sugar is better than last year.  Cholesterol is better than last year.  His blood counts, kidney function, liver function, electrolytes are all at goal.  Do not need to make any changes to his treatment plan at this time.  We can recheck everything in a year.

## 2023-01-19 DIAGNOSIS — G4733 Obstructive sleep apnea (adult) (pediatric): Secondary | ICD-10-CM | POA: Diagnosis not present

## 2023-01-20 ENCOUNTER — Other Ambulatory Visit (HOSPITAL_BASED_OUTPATIENT_CLINIC_OR_DEPARTMENT_OTHER): Payer: Self-pay

## 2023-01-20 ENCOUNTER — Ambulatory Visit (HOSPITAL_BASED_OUTPATIENT_CLINIC_OR_DEPARTMENT_OTHER)
Admission: RE | Admit: 2023-01-20 | Discharge: 2023-01-20 | Disposition: A | Discharge status: Home / Self Care | Payer: BC Managed Care – PPO | Source: Ambulatory Visit | Attending: Family Medicine | Admitting: Family Medicine

## 2023-01-20 DIAGNOSIS — I7121 Aneurysm of the ascending aorta, without rupture: Secondary | ICD-10-CM | POA: Diagnosis not present

## 2023-01-20 DIAGNOSIS — I77819 Aortic ectasia, unspecified site: Secondary | ICD-10-CM | POA: Diagnosis not present

## 2023-01-20 MED ORDER — SEMAGLUTIDE-WEIGHT MANAGEMENT 1 MG/0.5ML ~~LOC~~ SOAJ
1.0000 mg | SUBCUTANEOUS | 1 refills | Status: DC
  Filled 2023-01-20: qty 2, 28d supply, fill #0

## 2023-01-20 MED ORDER — IOHEXOL 350 MG/ML SOLN
100.0000 mL | Freq: Once | INTRAVENOUS | Status: AC | PRN
  Administered 2023-01-20: 75 mL via INTRAVENOUS

## 2023-01-20 MED ORDER — AMLODIPINE BESYLATE 5 MG PO TABS
5.0000 mg | ORAL_TABLET | Freq: Every day | ORAL | 2 refills | Status: DC
  Filled 2023-01-20 – 2023-02-21 (×2): qty 90, 90d supply, fill #0

## 2023-01-20 MED FILL — Atorvastatin Calcium Tab 80 MG (Base Equivalent): ORAL | 90 days supply | Qty: 90 | Fill #0 | Status: CN

## 2023-01-20 MED FILL — Losartan Potassium Tab 100 MG: ORAL | 90 days supply | Qty: 90 | Fill #0 | Status: CN

## 2023-01-20 MED FILL — Allopurinol Tab 300 MG: ORAL | 90 days supply | Qty: 90 | Fill #0 | Status: CN

## 2023-01-21 ENCOUNTER — Encounter: Payer: Self-pay | Admitting: Family Medicine

## 2023-01-21 ENCOUNTER — Ambulatory Visit (INDEPENDENT_AMBULATORY_CARE_PROVIDER_SITE_OTHER): Payer: BC Managed Care – PPO | Admitting: Family Medicine

## 2023-01-21 ENCOUNTER — Other Ambulatory Visit: Payer: Self-pay

## 2023-01-21 VITALS — BP 126/82 | HR 61 | Ht 69.0 in | Wt 235.0 lb

## 2023-01-21 DIAGNOSIS — T80818A Extravasation of other vesicant agent, initial encounter: Secondary | ICD-10-CM | POA: Diagnosis not present

## 2023-01-21 DIAGNOSIS — M79601 Pain in right arm: Secondary | ICD-10-CM

## 2023-01-21 NOTE — Patient Instructions (Signed)
Thank you for coming in today.   I recommend compression.   I will double check with radiology an let you know.

## 2023-01-21 NOTE — Progress Notes (Signed)
   I, Stevenson Clinch, CMA acting as a scribe for [name], MD.  Donald Gilbert is a 67 y.o. male who presents to [facility] today for arm pain. Had CT scan yesterday, had IV placed in right arm. Notes being asked if he could feel a warm sensation and he reported that he could not so they stopped the CT scan and placed IV in the left arm. Later that day he noticed significant swelling proximal to the IV site in the right arm. Swelling and erythema present today. Was advised by imaging center to alternative warm and cold compress.    Pertinent review of systems: No fevers or chills  Relevant historical information: Hyperlipidemia Status post gastric sleeve  Exam:  BP 126/82   Pulse 61   Ht 5\' 9"  (1.753 m)   Wt 235 lb (106.6 kg)   SpO2 97%   BMI 34.70 kg/m  General: Well Developed, well nourished, and in no acute distress.   MSK: Right upper arm mild swelling.  No erythema.  Normal elbow motion.  Pulses cap refill and strength are intact distally.    Lab and Radiology Results  Diagnostic Limited MSK Ultrasound of: Right arm and elbow Fluid is tracking superficially along subcutaneous tissue within subcutaneous tissue into the medial and upper arm. Impression: Subcutaneous fluid consistent in appearance with edema     Assessment and Plan: 67 y.o. male with right arm IV extravasation with IV contrast.  The volume that was injected is hard to know exactly but would have been less than 100 mL.  After doing some research and discussing the case with the radiologist organ to proceed with some watchful waiting and compression.  If worsening he should have a surgical consultation ideally plastic surgery.  However clinically he is already improving and I think will be just fine.   PDMP not reviewed this encounter. Orders Placed This Encounter  Procedures   [place] LIMITED JOINT SPACE STRUCTURES UP RIGHT(NO LINKED CHARGES)    Order Specific Question:    Reason for Exam (SYMPTOM  OR DIAGNOSIS REQUIRED)    Answer:   right arm pain    Order Specific Question:   Preferred imaging location?    Answer:   [facility]   No orders of the defined types were placed in this encounter.    Discussed warning signs or symptoms. Please see discharge instructions. Patient expresses understanding.   The above documentation has been reviewed and is accurate and complete [name], M.D.

## 2023-01-25 NOTE — Progress Notes (Signed)
Good news!  The dilation in his aorta is the same as last year. We can repeat in 1 year.

## 2023-02-21 ENCOUNTER — Other Ambulatory Visit (HOSPITAL_BASED_OUTPATIENT_CLINIC_OR_DEPARTMENT_OTHER): Payer: Self-pay

## 2023-02-21 MED FILL — Losartan Potassium Tab 100 MG: ORAL | 90 days supply | Qty: 90 | Fill #0 | Status: AC

## 2023-02-24 ENCOUNTER — Other Ambulatory Visit: Payer: Self-pay

## 2023-02-24 ENCOUNTER — Other Ambulatory Visit (HOSPITAL_BASED_OUTPATIENT_CLINIC_OR_DEPARTMENT_OTHER): Payer: Self-pay

## 2023-02-24 MED ORDER — ONDANSETRON 4 MG PO TBDP
4.0000 mg | ORAL_TABLET | Freq: Three times a day (TID) | ORAL | 0 refills | Status: AC | PRN
  Filled 2023-02-24: qty 6, 2d supply, fill #0

## 2023-02-24 MED ORDER — AMOXICILLIN 250 MG PO CAPS
250.0000 mg | ORAL_CAPSULE | Freq: Three times a day (TID) | ORAL | 0 refills | Status: DC
  Filled 2023-02-24: qty 21, 7d supply, fill #0

## 2023-02-24 MED ORDER — LORAZEPAM 2 MG PO TABS
2.0000 mg | ORAL_TABLET | Freq: Once | ORAL | 0 refills | Status: AC
  Filled 2023-02-24: qty 1, 1d supply, fill #0

## 2023-02-24 MED ORDER — HYDROCODONE-ACETAMINOPHEN 5-325 MG PO TABS
1.0000 | ORAL_TABLET | Freq: Four times a day (QID) | ORAL | 0 refills | Status: DC | PRN
  Filled 2023-02-24: qty 6, 2d supply, fill #0

## 2023-02-24 MED ORDER — CHLORHEXIDINE GLUCONATE 0.12 % MT SOLN
OROMUCOSAL | 0 refills | Status: AC
  Filled 2023-02-24: qty 473, 15d supply, fill #0

## 2023-02-26 ENCOUNTER — Other Ambulatory Visit (HOSPITAL_BASED_OUTPATIENT_CLINIC_OR_DEPARTMENT_OTHER): Payer: Self-pay

## 2023-02-26 MED FILL — Atorvastatin Calcium Tab 80 MG (Base Equivalent): ORAL | 90 days supply | Qty: 90 | Fill #0 | Status: CN

## 2023-02-26 MED FILL — Atorvastatin Calcium Tab 80 MG (Base Equivalent): ORAL | 90 days supply | Qty: 90 | Fill #0 | Status: AC

## 2023-02-28 ENCOUNTER — Other Ambulatory Visit (HOSPITAL_COMMUNITY): Payer: Self-pay

## 2023-02-28 ENCOUNTER — Other Ambulatory Visit (HOSPITAL_BASED_OUTPATIENT_CLINIC_OR_DEPARTMENT_OTHER): Payer: Self-pay

## 2023-02-28 MED FILL — Allopurinol Tab 300 MG: ORAL | 90 days supply | Qty: 90 | Fill #0 | Status: AC

## 2023-03-21 DIAGNOSIS — G4733 Obstructive sleep apnea (adult) (pediatric): Secondary | ICD-10-CM | POA: Diagnosis not present

## 2023-04-06 DIAGNOSIS — H1045 Other chronic allergic conjunctivitis: Secondary | ICD-10-CM | POA: Diagnosis not present

## 2023-04-06 DIAGNOSIS — H04123 Dry eye syndrome of bilateral lacrimal glands: Secondary | ICD-10-CM | POA: Diagnosis not present

## 2023-04-06 DIAGNOSIS — H02885 Meibomian gland dysfunction left lower eyelid: Secondary | ICD-10-CM | POA: Diagnosis not present

## 2023-04-06 DIAGNOSIS — H02882 Meibomian gland dysfunction right lower eyelid: Secondary | ICD-10-CM | POA: Diagnosis not present

## 2023-04-19 DIAGNOSIS — G4733 Obstructive sleep apnea (adult) (pediatric): Secondary | ICD-10-CM | POA: Diagnosis not present

## 2023-04-27 DIAGNOSIS — G4733 Obstructive sleep apnea (adult) (pediatric): Secondary | ICD-10-CM | POA: Diagnosis not present

## 2023-05-16 MED FILL — Losartan Potassium Tab 100 MG: ORAL | 90 days supply | Qty: 90 | Fill #1 | Status: AC

## 2023-05-19 ENCOUNTER — Other Ambulatory Visit (HOSPITAL_BASED_OUTPATIENT_CLINIC_OR_DEPARTMENT_OTHER): Payer: Self-pay

## 2023-05-19 ENCOUNTER — Ambulatory Visit: Payer: Self-pay | Admitting: Family Medicine

## 2023-05-19 ENCOUNTER — Other Ambulatory Visit: Payer: Self-pay | Admitting: Family Medicine

## 2023-05-19 ENCOUNTER — Other Ambulatory Visit: Payer: Self-pay

## 2023-05-19 MED ORDER — WEGOVY 1.7 MG/0.75ML ~~LOC~~ SOAJ
1.7000 mg | Freq: Every day | SUBCUTANEOUS | 0 refills | Status: DC
  Filled 2023-05-19: qty 3, 28d supply, fill #0
  Filled 2023-06-10: qty 3, 28d supply, fill #1
  Filled 2023-07-08: qty 3, 28d supply, fill #2
  Filled 2023-08-05: qty 3, 28d supply, fill #3
  Filled 2023-08-27 – 2023-09-01 (×2): qty 3, 28d supply, fill #4
  Filled 2023-09-23 – 2023-09-29 (×5): qty 3, 28d supply, fill #5
  Filled 2023-10-29: qty 3, 28d supply, fill #6
  Filled 2023-11-23: qty 3, 28d supply, fill #7

## 2023-05-19 NOTE — Telephone Encounter (Signed)
 Patient placed a refill request today for Semaglutide, on refill request it specified [facility] [place]. PCP sent script to [facility], patient requested to have prescription moved. Updated patient's preferred pharmacy in his chart. Called [facility] and requested they pull semaglutide to fill for patient. Called patient and updated him. Patient states moving forward he would like to use [facility]. Patient states the only time he uses [facility] is when he is out of town for work trips.  Copied from CRM [id number]. Topic: Clinical - Prescription Issue >> May 19, 2023  3:08 PM [place] J wrote: Reason for CRM: Medication (Semaglutide-Weight Management (WEGOVY) 1.7 MG/0.75ML) was sent in to the incorrect pharmacy. [facility] requested the [facility] [address] but Dr. [name] sent it to [facility]. Reason for Disposition  [1] Follow-up call to recent contact AND [2] information only call, no triage required  Answer Assessment - Initial Assessment Questions 1. REASON FOR CALL or QUESTION: "What is your reason for calling today?" or "How can I best help you?" or "What question do you have that I can help answer?"     Patient placed a refill request today for Semaglutide, on refill request it specified [facility] [place]. PCP sent script to [facility]. Updated patient's preferred pharmacy in his chart. Called [facility] and requested they pull semaglutide to fill for patient. Called patient and updated him.  Protocols used: Information Only Call - No Triage-A-AH

## 2023-05-19 NOTE — Telephone Encounter (Signed)
 Copied from CRM [id number]. Topic: Clinical - Medication Refill >> May 19, 2023  8:47 AM Deaijah H wrote: Most Recent Primary Care Visit:  Provider: Ardith Dark  Department: [facility]  Visit Type: OFFICE VISIT  Date: 01/07/2023  Medication: Semaglutide-Weight Management (WEGOVY) 1.7 MG/0.75ML SOAJ  Has the patient contacted their pharmacy? Yes (Agent: If no, request that the patient contact the pharmacy for the refill. If patient does not wish to contact the pharmacy document the reason why and proceed with request.) (Agent: If yes, when and what did the pharmacy advise?) Advised to contact Dr. Phoebe Perch  Is this the correct pharmacy for this prescription? Yes If no, delete pharmacy and type the correct one.  This is the patient's preferred pharmacy:   [facility] [address] [place] [address] Phone: [phone] Fax: [phone]    Has the prescription been filled recently? Yes  Is the patient out of the medication? Yes  Has the patient been seen for an appointment in the last year OR does the patient have an upcoming appointment? Yes  Can we respond through MyChart? Yes  Agent: Please be advised that Rx refills may take up to 3 business days. We ask that you follow-up with your pharmacy.

## 2023-05-19 NOTE — Telephone Encounter (Signed)
 Noted.

## 2023-05-20 MED FILL — Atorvastatin Calcium Tab 80 MG (Base Equivalent): ORAL | 90 days supply | Qty: 90 | Fill #1 | Status: AC

## 2023-05-25 MED FILL — Allopurinol Tab 300 MG: ORAL | 90 days supply | Qty: 90 | Fill #1 | Status: AC

## 2023-06-11 ENCOUNTER — Other Ambulatory Visit (HOSPITAL_BASED_OUTPATIENT_CLINIC_OR_DEPARTMENT_OTHER): Payer: Self-pay

## 2023-07-20 DIAGNOSIS — G4733 Obstructive sleep apnea (adult) (pediatric): Secondary | ICD-10-CM | POA: Diagnosis not present

## 2023-07-25 DIAGNOSIS — G4733 Obstructive sleep apnea (adult) (pediatric): Secondary | ICD-10-CM | POA: Diagnosis not present

## 2023-07-28 ENCOUNTER — Other Ambulatory Visit (HOSPITAL_COMMUNITY): Payer: Self-pay

## 2023-08-13 ENCOUNTER — Other Ambulatory Visit: Payer: Self-pay | Admitting: Family Medicine

## 2023-08-15 ENCOUNTER — Other Ambulatory Visit (HOSPITAL_BASED_OUTPATIENT_CLINIC_OR_DEPARTMENT_OTHER): Payer: Self-pay

## 2023-08-15 MED ORDER — LOSARTAN POTASSIUM 100 MG PO TABS
100.0000 mg | ORAL_TABLET | Freq: Every day | ORAL | 3 refills | Status: AC
  Filled 2023-08-15: qty 90, 90d supply, fill #0
  Filled 2023-11-11: qty 90, 90d supply, fill #1
  Filled 2024-02-06: qty 90, 90d supply, fill #2

## 2023-08-17 ENCOUNTER — Other Ambulatory Visit (HOSPITAL_BASED_OUTPATIENT_CLINIC_OR_DEPARTMENT_OTHER): Payer: Self-pay

## 2023-08-18 ENCOUNTER — Other Ambulatory Visit (HOSPITAL_BASED_OUTPATIENT_CLINIC_OR_DEPARTMENT_OTHER): Payer: Self-pay

## 2023-08-18 ENCOUNTER — Other Ambulatory Visit: Payer: Self-pay | Admitting: Family Medicine

## 2023-08-18 MED ORDER — ATORVASTATIN CALCIUM 80 MG PO TABS
80.0000 mg | ORAL_TABLET | Freq: Every day | ORAL | 0 refills | Status: DC
Start: 2023-08-18 — End: 2023-11-16
  Filled 2023-08-18: qty 90, 90d supply, fill #0

## 2023-08-20 DIAGNOSIS — G4733 Obstructive sleep apnea (adult) (pediatric): Secondary | ICD-10-CM | POA: Diagnosis not present

## 2023-08-23 ENCOUNTER — Other Ambulatory Visit (HOSPITAL_BASED_OUTPATIENT_CLINIC_OR_DEPARTMENT_OTHER): Payer: Self-pay

## 2023-08-23 ENCOUNTER — Other Ambulatory Visit: Payer: Self-pay | Admitting: Family Medicine

## 2023-08-23 DIAGNOSIS — N200 Calculus of kidney: Secondary | ICD-10-CM

## 2023-08-23 MED ORDER — ALLOPURINOL 300 MG PO TABS
300.0000 mg | ORAL_TABLET | Freq: Every day | ORAL | 3 refills | Status: AC
  Filled 2023-08-23: qty 90, 90d supply, fill #0
  Filled 2023-11-21: qty 90, 90d supply, fill #1
  Filled 2024-02-15: qty 90, 90d supply, fill #2

## 2023-08-25 ENCOUNTER — Encounter (HOSPITAL_COMMUNITY): Payer: Self-pay | Admitting: *Deleted

## 2023-08-27 ENCOUNTER — Other Ambulatory Visit (HOSPITAL_BASED_OUTPATIENT_CLINIC_OR_DEPARTMENT_OTHER): Payer: Self-pay

## 2023-09-23 ENCOUNTER — Other Ambulatory Visit (HOSPITAL_BASED_OUTPATIENT_CLINIC_OR_DEPARTMENT_OTHER): Payer: Self-pay

## 2023-09-26 ENCOUNTER — Other Ambulatory Visit (HOSPITAL_BASED_OUTPATIENT_CLINIC_OR_DEPARTMENT_OTHER): Payer: Self-pay

## 2023-09-27 ENCOUNTER — Other Ambulatory Visit (HOSPITAL_BASED_OUTPATIENT_CLINIC_OR_DEPARTMENT_OTHER): Payer: Self-pay

## 2023-09-28 ENCOUNTER — Other Ambulatory Visit (HOSPITAL_BASED_OUTPATIENT_CLINIC_OR_DEPARTMENT_OTHER): Payer: Self-pay

## 2023-11-02 ENCOUNTER — Other Ambulatory Visit: Payer: Self-pay

## 2023-11-02 ENCOUNTER — Other Ambulatory Visit: Payer: Self-pay | Admitting: Family Medicine

## 2023-11-02 ENCOUNTER — Other Ambulatory Visit (HOSPITAL_BASED_OUTPATIENT_CLINIC_OR_DEPARTMENT_OTHER): Payer: Self-pay

## 2023-11-02 MED FILL — Amlodipine Besylate Tab 5 MG (Base Equivalent): ORAL | 90 days supply | Qty: 90 | Fill #0 | Status: AC

## 2023-11-11 ENCOUNTER — Other Ambulatory Visit (HOSPITAL_BASED_OUTPATIENT_CLINIC_OR_DEPARTMENT_OTHER): Payer: Self-pay

## 2023-11-16 ENCOUNTER — Other Ambulatory Visit: Payer: Self-pay | Admitting: Family Medicine

## 2023-11-17 ENCOUNTER — Other Ambulatory Visit (HOSPITAL_BASED_OUTPATIENT_CLINIC_OR_DEPARTMENT_OTHER): Payer: Self-pay

## 2023-11-17 MED ORDER — ATORVASTATIN CALCIUM 80 MG PO TABS
80.0000 mg | ORAL_TABLET | Freq: Every day | ORAL | 0 refills | Status: DC
  Filled 2023-11-17: qty 30, 30d supply, fill #0

## 2023-11-21 ENCOUNTER — Other Ambulatory Visit (HOSPITAL_BASED_OUTPATIENT_CLINIC_OR_DEPARTMENT_OTHER): Payer: Self-pay

## 2023-11-24 ENCOUNTER — Other Ambulatory Visit (HOSPITAL_BASED_OUTPATIENT_CLINIC_OR_DEPARTMENT_OTHER): Payer: Self-pay

## 2023-11-24 ENCOUNTER — Other Ambulatory Visit: Payer: Self-pay

## 2023-12-14 ENCOUNTER — Other Ambulatory Visit (HOSPITAL_BASED_OUTPATIENT_CLINIC_OR_DEPARTMENT_OTHER): Payer: Self-pay

## 2023-12-14 ENCOUNTER — Other Ambulatory Visit: Payer: Self-pay | Admitting: Family Medicine

## 2023-12-14 MED ORDER — ATORVASTATIN CALCIUM 80 MG PO TABS
80.0000 mg | ORAL_TABLET | Freq: Every day | ORAL | 0 refills | Status: DC
  Filled 2023-12-14: qty 30, 30d supply, fill #0

## 2023-12-20 ENCOUNTER — Other Ambulatory Visit (HOSPITAL_BASED_OUTPATIENT_CLINIC_OR_DEPARTMENT_OTHER): Payer: Self-pay

## 2023-12-20 ENCOUNTER — Other Ambulatory Visit: Payer: Self-pay | Admitting: Family Medicine

## 2023-12-20 ENCOUNTER — Other Ambulatory Visit: Payer: Self-pay

## 2023-12-20 MED ORDER — AMLODIPINE BESYLATE 10 MG PO TABS
10.0000 mg | ORAL_TABLET | Freq: Every day | ORAL | 0 refills | Status: DC
  Filled 2023-12-20: qty 90, 90d supply, fill #0

## 2023-12-22 ENCOUNTER — Ambulatory Visit (INDEPENDENT_AMBULATORY_CARE_PROVIDER_SITE_OTHER)

## 2023-12-22 DIAGNOSIS — Z23 Encounter for immunization: Secondary | ICD-10-CM | POA: Diagnosis not present

## 2023-12-31 ENCOUNTER — Other Ambulatory Visit: Payer: Self-pay | Admitting: Family Medicine

## 2024-01-02 ENCOUNTER — Other Ambulatory Visit (HOSPITAL_BASED_OUTPATIENT_CLINIC_OR_DEPARTMENT_OTHER): Payer: Self-pay

## 2024-01-02 MED ORDER — WEGOVY 1.7 MG/0.75ML ~~LOC~~ SOAJ
1.7000 mg | Freq: Every day | SUBCUTANEOUS | 0 refills | Status: AC
  Filled 2024-01-02: qty 3, 28d supply, fill #0
  Filled 2024-01-22: qty 3, 28d supply, fill #1
  Filled 2024-02-13 – 2024-02-21 (×2): qty 3, 28d supply, fill #2
  Filled 2024-03-12 – 2024-03-20 (×2): qty 3, 28d supply, fill #3

## 2024-01-03 ENCOUNTER — Other Ambulatory Visit (HOSPITAL_BASED_OUTPATIENT_CLINIC_OR_DEPARTMENT_OTHER): Payer: Self-pay

## 2024-01-13 ENCOUNTER — Other Ambulatory Visit: Payer: Self-pay | Admitting: Family Medicine

## 2024-01-13 ENCOUNTER — Other Ambulatory Visit (HOSPITAL_BASED_OUTPATIENT_CLINIC_OR_DEPARTMENT_OTHER): Payer: Self-pay

## 2024-01-13 MED ORDER — ATORVASTATIN CALCIUM 80 MG PO TABS
80.0000 mg | ORAL_TABLET | Freq: Every day | ORAL | 0 refills | Status: DC
  Filled 2024-01-13: qty 30, 30d supply, fill #0

## 2024-01-23 ENCOUNTER — Other Ambulatory Visit (HOSPITAL_BASED_OUTPATIENT_CLINIC_OR_DEPARTMENT_OTHER): Payer: Self-pay

## 2024-02-13 ENCOUNTER — Other Ambulatory Visit: Payer: Self-pay | Admitting: Family Medicine

## 2024-02-13 ENCOUNTER — Other Ambulatory Visit (HOSPITAL_BASED_OUTPATIENT_CLINIC_OR_DEPARTMENT_OTHER): Payer: Self-pay

## 2024-02-13 MED ORDER — ATORVASTATIN CALCIUM 80 MG PO TABS
80.0000 mg | ORAL_TABLET | Freq: Every day | ORAL | 0 refills | Status: DC
  Filled 2024-02-13: qty 30, 30d supply, fill #0

## 2024-02-14 ENCOUNTER — Other Ambulatory Visit (HOSPITAL_BASED_OUTPATIENT_CLINIC_OR_DEPARTMENT_OTHER): Payer: Self-pay

## 2024-02-15 ENCOUNTER — Other Ambulatory Visit (HOSPITAL_BASED_OUTPATIENT_CLINIC_OR_DEPARTMENT_OTHER): Payer: Self-pay

## 2024-02-24 ENCOUNTER — Encounter: Admitting: Family Medicine

## 2024-03-09 ENCOUNTER — Other Ambulatory Visit: Payer: Self-pay | Admitting: Family Medicine

## 2024-03-09 ENCOUNTER — Other Ambulatory Visit (HOSPITAL_BASED_OUTPATIENT_CLINIC_OR_DEPARTMENT_OTHER): Payer: Self-pay

## 2024-03-09 ENCOUNTER — Other Ambulatory Visit: Payer: Self-pay

## 2024-03-09 MED ORDER — ATORVASTATIN CALCIUM 80 MG PO TABS
80.0000 mg | ORAL_TABLET | Freq: Every day | ORAL | 0 refills | Status: DC
  Filled 2024-03-09: qty 30, 30d supply, fill #0

## 2024-03-12 ENCOUNTER — Other Ambulatory Visit (HOSPITAL_BASED_OUTPATIENT_CLINIC_OR_DEPARTMENT_OTHER): Payer: Self-pay

## 2024-03-13 ENCOUNTER — Encounter: Admitting: Family Medicine

## 2024-03-19 ENCOUNTER — Other Ambulatory Visit (HOSPITAL_BASED_OUTPATIENT_CLINIC_OR_DEPARTMENT_OTHER): Payer: Self-pay

## 2024-03-19 ENCOUNTER — Other Ambulatory Visit: Payer: Self-pay | Admitting: Family Medicine

## 2024-03-19 MED ORDER — AMLODIPINE BESYLATE 10 MG PO TABS
10.0000 mg | ORAL_TABLET | Freq: Every day | ORAL | 0 refills | Status: AC
  Filled 2024-03-19: qty 90, 90d supply, fill #0

## 2024-03-20 ENCOUNTER — Other Ambulatory Visit (HOSPITAL_BASED_OUTPATIENT_CLINIC_OR_DEPARTMENT_OTHER): Payer: Self-pay

## 2024-03-21 ENCOUNTER — Other Ambulatory Visit (HOSPITAL_BASED_OUTPATIENT_CLINIC_OR_DEPARTMENT_OTHER): Payer: Self-pay

## 2024-03-30 ENCOUNTER — Ambulatory Visit: Payer: Self-pay | Admitting: Family Medicine

## 2024-03-30 ENCOUNTER — Encounter: Payer: Self-pay | Admitting: Family Medicine

## 2024-03-30 ENCOUNTER — Ambulatory Visit: Admitting: Family Medicine

## 2024-03-30 ENCOUNTER — Other Ambulatory Visit (HOSPITAL_BASED_OUTPATIENT_CLINIC_OR_DEPARTMENT_OTHER): Payer: Self-pay

## 2024-03-30 VITALS — BP 130/80 | HR 59 | Temp 97.9°F | Wt 234.6 lb

## 2024-03-30 DIAGNOSIS — G8929 Other chronic pain: Secondary | ICD-10-CM

## 2024-03-30 DIAGNOSIS — G4700 Insomnia, unspecified: Secondary | ICD-10-CM | POA: Diagnosis not present

## 2024-03-30 DIAGNOSIS — I10 Essential (primary) hypertension: Secondary | ICD-10-CM | POA: Diagnosis not present

## 2024-03-30 DIAGNOSIS — Z125 Encounter for screening for malignant neoplasm of prostate: Secondary | ICD-10-CM

## 2024-03-30 DIAGNOSIS — Z131 Encounter for screening for diabetes mellitus: Secondary | ICD-10-CM

## 2024-03-30 DIAGNOSIS — G4733 Obstructive sleep apnea (adult) (pediatric): Secondary | ICD-10-CM

## 2024-03-30 DIAGNOSIS — F341 Dysthymic disorder: Secondary | ICD-10-CM

## 2024-03-30 DIAGNOSIS — N200 Calculus of kidney: Secondary | ICD-10-CM | POA: Diagnosis not present

## 2024-03-30 DIAGNOSIS — E782 Mixed hyperlipidemia: Secondary | ICD-10-CM

## 2024-03-30 DIAGNOSIS — Z23 Encounter for immunization: Secondary | ICD-10-CM | POA: Diagnosis not present

## 2024-03-30 DIAGNOSIS — J329 Chronic sinusitis, unspecified: Secondary | ICD-10-CM | POA: Diagnosis not present

## 2024-03-30 DIAGNOSIS — R6889 Other general symptoms and signs: Secondary | ICD-10-CM | POA: Diagnosis not present

## 2024-03-30 DIAGNOSIS — Z6834 Body mass index (BMI) 34.0-34.9, adult: Secondary | ICD-10-CM | POA: Diagnosis not present

## 2024-03-30 DIAGNOSIS — E669 Obesity, unspecified: Secondary | ICD-10-CM | POA: Diagnosis not present

## 2024-03-30 DIAGNOSIS — Z0001 Encounter for general adult medical examination with abnormal findings: Secondary | ICD-10-CM | POA: Diagnosis not present

## 2024-03-30 DIAGNOSIS — I77819 Aortic ectasia, unspecified site: Secondary | ICD-10-CM | POA: Diagnosis not present

## 2024-03-30 DIAGNOSIS — E559 Vitamin D deficiency, unspecified: Secondary | ICD-10-CM | POA: Diagnosis not present

## 2024-03-30 DIAGNOSIS — R7303 Prediabetes: Secondary | ICD-10-CM

## 2024-03-30 LAB — TSH: TSH: 1.89 u[IU]/mL (ref 0.35–5.50)

## 2024-03-30 LAB — CBC
HCT: 42.9 % (ref 39.0–52.0)
Hemoglobin: 14.9 g/dL (ref 13.0–17.0)
MCHC: 34.7 g/dL (ref 30.0–36.0)
MCV: 88.6 fl (ref 78.0–100.0)
Platelets: 211 K/uL (ref 150.0–400.0)
RBC: 4.84 Mil/uL (ref 4.22–5.81)
RDW: 13.1 % (ref 11.5–15.5)
WBC: 6.4 K/uL (ref 4.0–10.5)

## 2024-03-30 LAB — COMPREHENSIVE METABOLIC PANEL WITH GFR
ALT: 24 U/L (ref 3–53)
AST: 18 U/L (ref 5–37)
Albumin: 4.5 g/dL (ref 3.5–5.2)
Alkaline Phosphatase: 78 U/L (ref 39–117)
BUN: 22 mg/dL (ref 6–23)
CO2: 31 meq/L (ref 19–32)
Calcium: 9.3 mg/dL (ref 8.4–10.5)
Chloride: 102 meq/L (ref 96–112)
Creatinine, Ser: 0.95 mg/dL (ref 0.40–1.50)
GFR: 83.66 mL/min
Glucose, Bld: 83 mg/dL (ref 70–99)
Potassium: 4.2 meq/L (ref 3.5–5.1)
Sodium: 139 meq/L (ref 135–145)
Total Bilirubin: 1.2 mg/dL (ref 0.2–1.2)
Total Protein: 6.8 g/dL (ref 6.0–8.3)

## 2024-03-30 LAB — HEMOGLOBIN A1C: Hgb A1c MFr Bld: 5.7 % (ref 4.6–6.5)

## 2024-03-30 LAB — URIC ACID: Uric Acid, Serum: 4.3 mg/dL (ref 4.0–7.8)

## 2024-03-30 LAB — PSA: PSA: 2.5 ng/mL (ref 0.10–4.00)

## 2024-03-30 LAB — LIPID PANEL
Cholesterol: 139 mg/dL (ref 28–200)
HDL: 44 mg/dL
LDL Cholesterol: 72 mg/dL (ref 10–99)
NonHDL: 94.61
Total CHOL/HDL Ratio: 3
Triglycerides: 111 mg/dL (ref 10.0–149.0)
VLDL: 22.2 mg/dL (ref 0.0–40.0)

## 2024-03-30 LAB — VITAMIN D 25 HYDROXY (VIT D DEFICIENCY, FRACTURES): VITD: 51.08 ng/mL (ref 30.00–100.00)

## 2024-03-30 LAB — FOLATE: Folate: >23.4 ng/mL

## 2024-03-30 LAB — VITAMIN B12: Vitamin B-12: 1057 pg/mL — ABNORMAL HIGH (ref 211–911)

## 2024-03-30 MED ORDER — WEGOVY 2.4 MG/0.75ML ~~LOC~~ SOAJ
2.4000 mg | SUBCUTANEOUS | 3 refills | Status: AC
  Filled 2024-03-30: qty 9, 84d supply, fill #0

## 2024-03-30 MED ORDER — AMOXICILLIN-POT CLAVULANATE 875-125 MG PO TABS
1.0000 | ORAL_TABLET | Freq: Two times a day (BID) | ORAL | 0 refills | Status: AC
  Filled 2024-03-30: qty 20, 10d supply, fill #0

## 2024-03-30 NOTE — Progress Notes (Signed)
 Labs are all at goal.  Do not need to make any adjustments to his treatment plan at this time.  We can recheck all of his blood work in a year or so.  I would like for them to follow-up with us  in a few weeks to let us  know how he is doing with the increased dose of Wegovy .

## 2024-03-30 NOTE — Assessment & Plan Note (Signed)
Repeat CTA per radiology recommendations.

## 2024-03-30 NOTE — Assessment & Plan Note (Signed)
 Still an ongoing issue.  We did discuss physical therapy.  Continue management per sports medicine.SABRA

## 2024-03-30 NOTE — Assessment & Plan Note (Signed)
 Weight has plateaued over the last year.  Will increase his Wegovy  to 2.4 mg weekly.  He is aware of potential side effects.  Discussed lifestyle modifications as well.

## 2024-03-30 NOTE — Assessment & Plan Note (Signed)
 No red flags.  We are checking labs today including B12 and folate.  This is likely related to his increased stress and sleep deficiencies.  We did discuss management strategies for this and discussed importance of maintaining good sleep habits.

## 2024-03-30 NOTE — Assessment & Plan Note (Signed)
 He has had some difficulty with using his CPAP at night due to his worsening sinusitis.  We are treating this as above though did encourage continued use of his CPAP as tolerated.

## 2024-03-30 NOTE — Assessment & Plan Note (Signed)
Check vitamin D with labs.

## 2024-03-30 NOTE — Assessment & Plan Note (Signed)
 Patient with recurrent and chronic sinusitis for many years.  Has had ENT workup in the past which did show nasal polyps.  Tried Flonase which was not effective.  Symptoms seem to have worsened recently.  Will refer him to ENT for definitive management though will treat his acute sinusitis with a course of Augmentin .  He will let us  know how he is doing with this in a couple weeks.

## 2024-03-30 NOTE — Progress Notes (Signed)
 "  Chief Complaint:  Donald Gilbert is a 69 y.o. male who presents today for his annual comprehensive physical exam.    Assessment/Plan:  Chronic Problems Addressed Today: Recurrent sinusitis Patient with recurrent and chronic sinusitis for many years.  Has had ENT workup in the past which did show nasal polyps.  Tried Flonase which was not effective.  Symptoms seem to have worsened recently.  Will refer him to ENT for definitive management though will treat his acute sinusitis with a course of Augmentin .  He will let us  know how he is doing with this in a couple weeks.  Vitamin D  deficiency Check vitamin D  with labs.  Essential hypertension Blood pressure at goal today.  Will continue losartan  100 mg daily and amlodipine  10 mg daily.  Mixed hyperlipidemia Check lipids.  Discussed lifestyle modifications.  He is on Lipitor 80 mg daily.  Prediabetes Check A1c with labs.  OSA on CPAP He has had some difficulty with using his CPAP at night due to his worsening sinusitis.  We are treating this as above though did encourage continued use of his CPAP as tolerated.   Insomnia Patient has been under more stress and will be retiring soon which is adding some to his stress as well.  This is probably contributing some to his short-term memory loss and forgetfulness.  We are treating the sinusitis as above which is probably contributing to his insomnia due to issues with the CPAP.  We did discuss trial of medication however hold off on this for now.  He can use over-the-counter meds as needed as well.  They will follow-up with us  in a few weeks to let us  know how he is doing with this.  Forgetfulness No red flags.  We are checking labs today including B12 and folate.  This is likely related to his increased stress and sleep deficiencies.  We did discuss management strategies for this and discussed importance of maintaining good sleep habits.  Obesity Weight has plateaued over the last year.  Will  increase his Wegovy  to 2.4 mg weekly.  He is aware of potential side effects.  Discussed lifestyle modifications as well.  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  Chronic neck pain Still an ongoing issue.  We did discuss physical therapy.  Continue management per sports medicine..    Preventative Healthcare: Prevnar 20 given today.  Check labs including PSA.  Up-to-date on colon cancer screening.  Due for next colonoscopy in 2030.  Patient Counseling(The following topics were reviewed and/or handout was given):  -Nutrition: Stressed importance of moderation in sodium/caffeine intake, saturated fat and cholesterol, caloric balance, sufficient intake of fresh fruits, vegetables, and fiber.  -Stressed the importance of regular exercise.   -Substance Abuse: Discussed cessation/primary prevention of tobacco, alcohol, or other drug use; driving or other dangerous activities under the influence; availability of treatment for abuse.   -Injury prevention: Discussed safety belts, safety helmets, smoke detector, smoking near bedding or upholstery.   -Sexuality: Discussed sexually transmitted diseases, partner selection, use of condoms, avoidance of unintended pregnancy and contraceptive alternatives.   -Dental health: Discussed importance of regular tooth brushing, flossing, and dental visits.  -Health maintenance and immunizations reviewed. Please refer to Health maintenance section.  Return to care in 1 year for next preventative visit.     Subjective:  HPI:  He has no acute complaints today. Patient is here today for his annual physical.  See assessment / plan for status  of chronic conditions.  Discussed the use of AI scribe software for clinical note transcription with the patient, who gave verbal consent to proceed.  History of Present Illness Donald Gilbert is a 69 year old male who presents for an annual physical exam and evaluation of sinus  issues.  He reports that over the past few months, his sinus symptoms have gotten worse, with nasal congestion that affects his ability to breathe while eating, forcing him to chew with his mouth open. He has not tried any specific treatments recently, but notes that being outside sometimes alleviates symptoms. He recalls having nasal polyps in the past, including an episode where he sneezed out a polyp that was confirmed by a doctor. He once sneezed out a polyp, which was confirmed by a doctor. He has experienced frequent epistaxis in the past and reports that a prick test indicated a wheat allergy, though he questions the accuracy of this result. He has tried Flonase in the past without success. No recent epistaxis.  He is currently using a CPAP machine for sleep apnea, but his nasal congestion is interfering with his ability to sleep well. He sleeps less than five hours per night due to work-related stress and having a lot on his mind. He notes that he sleeps better on weekends when he is home.  He has concerns about short-term memory loss over the past six to eight months, fearing it may worsen. He has no family history of dementia. He attributes some memory issues to poor sleep and work stress. He is interested in checking vitamin levels.  He is on Wegovy  for weight management and reports constipation as a side effect. He has started using a stool softener, Dexilax, to manage this.  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 He experiences neck pain and stiffness but has not pursued recent physical therapy. He recalls a past neck injury from carrying heavy materials.       03/30/2024    7:31 AM  Depression screen PHQ 2/9  Decreased Interest 0  Down, Depressed, Hopeless 0  PHQ - 2 Score 0  Altered sleeping 0  Tired, decreased energy 0  Change in appetite 0  Feeling bad or failure about yourself  0  Trouble concentrating 0  Moving  slowly or fidgety/restless 0  Suicidal thoughts 0  PHQ-9 Score 0  Difficult doing work/chores Not difficult at all    Health Maintenance Due  Topic Date Due   Lung Cancer Screening  01/20/2024     ROS: Per HPI, otherwise a complete review of systems was negative.   PMH:  The following were reviewed and entered/updated in epic: Past Medical History:  Diagnosis Date   Allergy    mild    Arthralgia of multiple joints 04/03/2017   Arthritis    Atopic dermatitis 04/03/2017   BPPV (benign paroxysmal positional vertigo)    Chickenpox    Constipation    maybe twice a week due to job    Diabetes mellitus without complication (HCC)    type 2   Difficult intubation    Dysthymia 04/03/2017   History of kidney stones    recurrent   Hyperlipidemia    Hypertension    Insulin  resistance 04/03/2017   Nasal polyp    OSA on CPAP    Sleep apnea    wears cpap   11cm H20   Urinary hesitancy 04/03/2017   Vitamin D  deficiency 04/03/2017  Patient Active Problem List   Diagnosis Date Noted   Recurrent sinusitis 03/30/2024   Insomnia 03/30/2024   Forgetfulness 03/30/2024   Acute tear medial meniscus, right, initial encounter 02/10/2022   Aortic dilatation (HCC) - needs yearly CTA 09/02/2021   Partial tear of right rotator cuff 03/11/2021   Erectile dysfunction 10/10/2020   Chronic neck pain 10/05/2019   Prediabetes 01/22/2019   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   Former smoker 01/19/2019   Difficult intubation    OSA on CPAP 05/30/2018   Vitamin D  deficiency 04/03/2017   Obesity 04/03/2017   Essential hypertension 04/03/2017   Mixed hyperlipidemia 04/03/2017   Recurrent kidney stones 04/03/2017   Arthralgia of multiple joints 04/03/2017   Atopic dermatitis 04/03/2017   Dysthymia 04/03/2017   Past Surgical History:  Procedure Laterality Date   COLONOSCOPY  03/2008   no precancerous polyps   COLONOSCOPY WITH PROPOFOL  N/A 10/02/2018   Procedure: COLONOSCOPY WITH  PROPOFOL ;  Surgeon: Legrand Victory LITTIE DOUGLAS, MD;  Location: [facility];  Service: Gastroenterology;  Laterality: N/A;   LAPAROSCOPIC GASTRIC SLEEVE RESECTION N/A 01/22/2019   Procedure: LAPAROSCOPIC GASTRIC SLEEVE RESECTION AND HIATAL HERNIA REPAIR, Upper Endo, Eras Pathway;  Surgeon: Tanda Locus, MD;  Location: [facility];  Service: General;  Laterality: N/A;   LASIK     TONSILLECTOMY     UMBILICAL HERNIA REPAIR     2016   uvula removed       Family History  Problem Relation Age of Onset   Hypertension Mother    Colon cancer Neg Hx    Colon polyps Neg Hx    Esophageal cancer Neg Hx    Rectal cancer Neg Hx    Stomach cancer Neg Hx     Medications- reviewed and updated Current Outpatient Medications  Medication Sig Dispense Refill   allopurinol  (ZYLOPRIM ) 300 MG tablet Take 1 tablet (300 mg total) by mouth daily. 90 tablet 3   amLODipine  (NORVASC ) 10 MG tablet Take 1 tablet (10 mg total) by mouth daily. 90 tablet 0   amoxicillin -clavulanate (AUGMENTIN ) 875-125 MG tablet Take 1 tablet by mouth 2 (two) times daily. 20 tablet 0   atorvastatin  (LIPITOR) 80 MG tablet Take 1 tablet (80 mg total) by mouth daily. 30 tablet 0   calcium  carbonate (TUMS - DOSED IN MG ELEMENTAL CALCIUM ) 500 MG chewable tablet Chew 1 tablet by mouth 3 (three) times daily with meals.     chlorhexidine  (PERIDEX ) 0.12 % solution Place 15 milliliters in the mouth 2 times per day after brushing teeth, Start 5 days pre-op 473 mL 0   losartan  (COZAAR ) 100 MG tablet Take 1 tablet (100 mg total) by mouth daily. 90 tablet 3   Multiple Vitamin (MULTIVITAMIN WITH MINERALS) TABS tablet Take 1 tablet by mouth daily.     ondansetron  (ZOFRAN ) 4 MG tablet Take 1 tablet (4 mg total) by mouth every 8 (eight) hours as needed for nausea or vomiting. 40 tablet 0   ondansetron  (ZOFRAN -ODT) 4 MG disintegrating tablet Dissolve 1 tablet (4 mg total)  on top of the tongue where they will dissolve, then swallow by mouth every 8 (eight) hours as  needed for nausea or vomiting. 6 tablet 0   semaglutide -weight management (WEGOVY ) 2.4 MG/0.75ML SOAJ SQ injection Inject 2.4 mg into the skin once a week. 9 mL 3   tadalafil  (CIALIS ) 5 MG tablet Take 1 tablet (5 mg total) by mouth daily as needed for erectile dysfunction. 10 tablet 0   urea  (CARMOL) 40 %  CREA Apply daily 227 g 5   No current facility-administered medications for this visit.    Allergies-reviewed and updated Allergies[1]  Social History   Socioeconomic History   Marital status: Married    Spouse name: Not on file   Number of children: Not on file   Years of education: Not on file   Highest education level: Associate degree: occupational, scientist, product/process development, or vocational program  Occupational History    Employer: [employer]  Tobacco Use   Smoking status: Former   Smokeless tobacco: Former    Types: Chew    Quit date: 2001  Vaping Use   Vaping status: Never Used  Substance and Sexual Activity   Alcohol use: Not Currently    Comment: OCC    Drug use: No   Sexual activity: Yes  Other Topics Concern   Not on file  Social History Narrative   Not on file   Social Drivers of Health   Tobacco Use: Medium Risk (03/30/2024)   Patient History    Smoking Tobacco Use: Former    Smokeless Tobacco Use: Former    Passive Exposure: Not on Actuary Strain: Low Risk (01/03/2023)   Overall Financial Resource Strain (CARDIA)    Difficulty of Paying Living Expenses: Not hard at all  Food Insecurity: No Food Insecurity (01/03/2023)   Hunger Vital Sign    Worried About Running Out of Food in the Last Year: Never true    Ran Out of Food in the Last Year: Never true  Transportation Needs: No Transportation Needs (01/03/2023)   PRAPARE - Administrator, Civil Service (Medical): No    Lack of Transportation (Non-Medical): No  Physical Activity: Sufficiently Active (01/03/2023)   Exercise Vital Sign    Days of Exercise per Week: 7 days     Minutes of Exercise per Session: 60 min  Stress: No Stress Concern Present (01/03/2023)   [employer] of Occupational Health - Occupational Stress Questionnaire    Feeling of Stress : Only a little  Social Connections: Moderately Integrated (01/03/2023)   Social Connection and Isolation Panel    Frequency of Communication with Friends and Family: Twice a week    Frequency of Social Gatherings with Friends and Family: Once a week    Attends Religious Services: Never    Database Administrator or Organizations: Yes    Attends Banker Meetings: Never    Marital Status: Married  Depression (PHQ2-9): Low Risk (03/30/2024)   Depression (PHQ2-9)    PHQ-2 Score: 0  Alcohol Screen: Low Risk (01/03/2023)   Alcohol Screen    Last Alcohol Screening Score (AUDIT): 1  Housing: Low Risk (01/03/2023)   Housing    Last Housing Risk Score: 0  Utilities: Not on file  Health Literacy: Not on file        Objective:  Physical Exam: BP 130/80   Pulse (!) 59   Temp 97.9 F (36.6 C) (Temporal)   Wt 234 lb 9.6 oz (106.4 kg)   SpO2 98%   BMI 34.64 kg/m   Body mass index is 34.64 kg/m. Wt Readings from Last 3 Encounters:  03/30/24 234 lb 9.6 oz (106.4 kg)  01/21/23 235 lb (106.6 kg)  01/07/23 238 lb 8 oz (108.2 kg)   Gen: NAD, resting comfortably HEENT: TMs normal bilaterally. OP clear. No thyromegaly noted.  CV: RRR with no murmurs appreciated Pulm: NWOB, CTAB with no crackles, wheezes, or rhonchi GI: Normal bowel sounds  present. Soft, Nontender, Nondistended. MSK: no edema, cyanosis, or clubbing noted Skin: warm, dry Neuro: CN2-12 grossly intact. Strength 5/5 in upper and lower extremities. Reflexes symmetric and intact bilaterally.  Psych: Normal affect and thought content     Arbor Leer M. Kennyth, MD 03/30/2024 8:04 AM     [1] No Known Allergies  "

## 2024-03-30 NOTE — Assessment & Plan Note (Signed)
 Check lipids.  Discussed lifestyle modifications.  He is on Lipitor 80 mg daily.

## 2024-03-30 NOTE — Assessment & Plan Note (Signed)
 Check A1c with labs.

## 2024-03-30 NOTE — Patient Instructions (Addendum)
 It was very nice to see you today!  VISIT SUMMARY: During your visit, we discussed your sinus issues, sleep apnea, insomnia, memory concerns, weight management, and neck pain. We also addressed routine screenings and vitamin levels.  YOUR PLAN: RECURRENT CHRONIC SINUSITIS WITH NASAL POLYPS: Your sinusitis with nasal polyps is worsening, and Flonase is not effective. -Use sinus rinse for temporary relief. -You are referred to an ENT specialist for evaluation of nasal polyps and possible deviated septum. -An antibiotic is prescribed for temporary relief of sinusitis symptoms.  OBSTRUCTIVE SLEEP APNEA: Nasal congestion is affecting your CPAP use and sleep quality. -Addressing nasal congestion is necessary to improve CPAP efficacy.  INSOMNIA: Your chronic insomnia may be worsened by stress and nasal congestion. -Addressing nasal congestion is advised to improve sleep quality. -Consider over-the-counter sleep aids like Benadryl  or melatonin if needed. -Aim for seven hours of sleep per night.  SHORT-TERM MEMORY LOSS: Memory loss may be related to sleep deprivation and stress. -Blood work is ordered to check vitamin D , B12, and folate levels. -Engage in cognitive exercises such as crosswords and Sudoku. -Addressing sleep issues is advised to improve memory consolidation.  OBESITY: Your weight has plateaued on the current dose of Wegovy . -Increase Wegovy  dose to 2.4 mg. -Use fiber supplements like Metamucil or Benefiber to manage constipation. -Continue regular exercise and a healthy diet.  MIXED HYPERLIPIDEMIA: Your condition is managed with lifestyle modifications. -Use fiber supplements like Metamucil or Benefiber.  VITAMIN D  DEFICIENCY: Your vitamin D  levels need to be checked. -Blood work is ordered to check vitamin D  levels.  PROSTATE CANCER SCREENING: Routine screening is needed. -A PSA test is ordered as part of blood work.  SCREENING FOR DIABETES MELLITUS: Routine screening is  needed. -An A1c test is ordered as part of blood work.  CERVICAL SPONDYLOSIS: Your chronic cervical spondylosis with arthritis is managed with physical therapy. -A handout with exercises for cervical spondylosis is provided. -Continue physical therapy exercises.  Return in about 1 year (around 03/30/2025) for Annual Physical.   Take care, Dr Kennyth  PLEASE NOTE:  If you had any lab tests, please let us  know if you have not heard back within a few days. You may see your results on mychart before we have a chance to review them but we will give you a call once they are reviewed by us .   If we ordered any referrals today, please let us  know if you have not heard from their office within the next week.   If you had any urgent prescriptions sent in today, please check with the pharmacy within an hour of our visit to make sure the prescription was transmitted appropriately.   Please try these tips to maintain a healthy lifestyle:  Eat at least 3 REAL meals and 1-2 snacks per day.  Aim for no more than 5 hours between eating.  If you eat breakfast, please do so within one hour of getting up.   Each meal should contain half fruits/vegetables, one quarter protein, and one quarter carbs (no bigger than a computer mouse)  Cut down on sweet beverages. This includes juice, soda, and sweet tea.   Drink at least 1 glass of water with each meal and aim for at least 8 glasses per day  Exercise at least 150 minutes every week.     Preventive Care 37 Years and Older, Male Preventive care refers to lifestyle choices and visits with your health care provider that can promote health and wellness. Preventive care  visits are also called wellness exams. What can I expect for my preventive care visit? Counseling During your preventive care visit, your health care provider may ask about your: Medical history, including: Past medical problems. Family medical history. History of falls. Current health,  including: Emotional well-being. Home life and relationship well-being. Sexual activity. Memory and ability to understand (cognition). Lifestyle, including: Alcohol, nicotine or tobacco, and drug use. Access to firearms. Diet, exercise, and sleep habits. Work and work astronomer. Sunscreen use. Safety issues such as seatbelt and bike helmet use. Physical exam Your health care provider will check your: Height and weight. These may be used to calculate your BMI (body mass index). BMI is a measurement that tells if you are at a healthy weight. Waist circumference. This measures the distance around your waistline. This measurement also tells if you are at a healthy weight and may help predict your risk of certain diseases, such as type 2 diabetes and high blood pressure. Heart rate and blood pressure. Body temperature. Skin for abnormal spots. What immunizations do I need?  Vaccines are usually given at various ages, according to a schedule. Your health care provider will recommend vaccines for you based on your age, medical history, and lifestyle or other factors, such as travel or where you work. What tests do I need? Screening Your health care provider may recommend screening tests for certain conditions. This may include: Lipid and cholesterol levels. Diabetes screening. This is done by checking your blood sugar (glucose) after you have not eaten for a while (fasting). Hepatitis C test. Hepatitis B test. HIV (human immunodeficiency virus) test. STI (sexually transmitted infection) testing, if you are at risk. Lung cancer screening. Colorectal cancer screening. Prostate cancer screening. Abdominal aortic aneurysm (AAA) screening. You may need this if you are a current or former smoker. Talk with your health care provider about your test results, treatment options, and if necessary, the need for more tests. Follow these instructions at home: Eating and drinking  Eat a diet that  includes fresh fruits and vegetables, whole grains, lean protein, and low-fat dairy products. Limit your intake of foods with high amounts of sugar, saturated fats, and salt. Take vitamin and mineral supplements as recommended by your health care provider. Do not drink alcohol if your health care provider tells you not to drink. If you drink alcohol: Limit how much you have to 0-2 drinks a day. Know how much alcohol is in your drink. In the [place] one drink equals one 12 oz bottle of beer (355 mL), one 5 oz glass of wine (148 mL), or one 1 oz glass of hard liquor (44 mL). Lifestyle Brush your teeth every morning and night with fluoride toothpaste. Floss one time each day. Exercise for at least 30 minutes 5 or more days each week. Do not use any products that contain nicotine or tobacco. These products include cigarettes, chewing tobacco, and vaping devices, such as e-cigarettes. If you need help quitting, ask your health care provider. Do not use drugs. If you are sexually active, practice safe sex. Use a condom or other form of protection to prevent STIs. Take aspirin  only as told by your health care provider. Make sure that you understand how much to take and what form to take. Work with your health care provider to find out whether it is safe and beneficial for you to take aspirin  daily. Ask your health care provider if you need to take a cholesterol-lowering medicine (statin). Find healthy ways to manage  stress, such as: Meditation, yoga, or listening to music. Journaling. Talking to a trusted person. Spending time with friends and family. Safety Always wear your seat belt while driving or riding in a vehicle. Do not drive: If you have been drinking alcohol. Do not ride with someone who has been drinking. When you are tired or distracted. While texting. If you have been using any mind-altering substances or drugs. Wear a helmet and other protective equipment during sports  activities. If you have firearms in your house, make sure you follow all gun safety procedures. Minimize exposure to UV radiation to reduce your risk of skin cancer. What's next? Visit your health care provider once a year for an annual wellness visit. Ask your health care provider how often you should have your eyes and teeth checked. Stay up to date on all vaccines. This information is not intended to replace advice given to you by your health care provider. Make sure you discuss any questions you have with your health care provider. Document Revised: 08/27/2020 Document Reviewed: 08/27/2020 Elsevier Patient Education  2024 [employer].

## 2024-03-30 NOTE — Assessment & Plan Note (Signed)
 Patient has been under more stress and will be retiring soon which is adding some to his stress as well.  This is probably contributing some to his short-term memory loss and forgetfulness.  We are treating the sinusitis as above which is probably contributing to his insomnia due to issues with the CPAP.  We did discuss trial of medication however hold off on this for now.  He can use over-the-counter meds as needed as well.  They will follow-up with us  in a few weeks to let us  know how he is doing with this.

## 2024-03-30 NOTE — Assessment & Plan Note (Signed)
 Blood pressure at goal today.  Will continue losartan  100 mg daily and amlodipine  10 mg daily.

## 2024-04-02 ENCOUNTER — Telehealth: Payer: Self-pay

## 2024-04-02 ENCOUNTER — Other Ambulatory Visit (HOSPITAL_COMMUNITY): Payer: Self-pay

## 2024-04-02 NOTE — Telephone Encounter (Signed)
 Pharmacy Patient Advocate Encounter   Received notification from Onbase CMM KEY that prior authorization for Wegovy  1.7MG /0.75ML auto-injectors  is required/requested.   Insurance verification completed.   The patient is insured through [facility] [place].   Per test claim: PA required; PA started via CoverMyMeds. KEY [id number] . Please see clinical question(s) below that I am not finding the answer to in their chart and advise.

## 2024-04-03 ENCOUNTER — Other Ambulatory Visit (HOSPITAL_COMMUNITY): Payer: Self-pay

## 2024-04-03 NOTE — Telephone Encounter (Signed)
 Clinical questions have been answered and PA submitted. PA currently Pending.

## 2024-04-03 NOTE — Telephone Encounter (Signed)
 See PCP note.

## 2024-04-03 NOTE — Telephone Encounter (Signed)
 Patient notified Prior Authorization for Wegovy  1.7mg /0.72ml has been APPROVED from 03/04/24 to 04/03/25

## 2024-04-03 NOTE — Telephone Encounter (Signed)
 His weight was 260 when we started in 2023 and was 234 at his most recent visit here last week. This is more than a 5% weight loss from his baseline. Please resubmit the Prior authorization.

## 2024-04-03 NOTE — Telephone Encounter (Signed)
 Pharmacy Patient Advocate Encounter  Received notification from [employer] that Prior Authorization for Wegovy  1.7mg /0.52ml has been APPROVED from 03/04/24 to 04/03/25   PA #/Case ID/Reference #: [id number]  Approval letter indexed to media tab

## 2024-04-03 NOTE — Telephone Encounter (Signed)
 Please advise.

## 2024-04-06 ENCOUNTER — Other Ambulatory Visit (HOSPITAL_BASED_OUTPATIENT_CLINIC_OR_DEPARTMENT_OTHER): Payer: Self-pay

## 2024-04-09 ENCOUNTER — Other Ambulatory Visit: Payer: Self-pay | Admitting: Family Medicine

## 2024-04-09 ENCOUNTER — Other Ambulatory Visit: Payer: Self-pay

## 2024-04-09 MED ORDER — ATORVASTATIN CALCIUM 80 MG PO TABS
80.0000 mg | ORAL_TABLET | Freq: Every day | ORAL | 0 refills | Status: AC
  Filled 2024-04-09: qty 30, 30d supply, fill #0

## 2024-04-11 ENCOUNTER — Other Ambulatory Visit (HOSPITAL_BASED_OUTPATIENT_CLINIC_OR_DEPARTMENT_OTHER): Payer: Self-pay

## 2024-05-10 ENCOUNTER — Institutional Professional Consult (permissible substitution) (INDEPENDENT_AMBULATORY_CARE_PROVIDER_SITE_OTHER): Admitting: Otolaryngology
